# Patient Record
Sex: Female | Born: 1945
Health system: Southern US, Community
[De-identification: ages and names within clinical notes are randomized; demographics above are authoritative.]

## PROBLEM LIST (undated history)

## (undated) DIAGNOSIS — F32A Depression, unspecified: Secondary | ICD-10-CM

## (undated) DIAGNOSIS — E785 Hyperlipidemia, unspecified: Secondary | ICD-10-CM

## (undated) DIAGNOSIS — E119 Type 2 diabetes mellitus without complications: Secondary | ICD-10-CM

## (undated) DIAGNOSIS — F329 Major depressive disorder, single episode, unspecified: Secondary | ICD-10-CM

## (undated) DIAGNOSIS — I10 Essential (primary) hypertension: Secondary | ICD-10-CM

## (undated) DIAGNOSIS — F419 Anxiety disorder, unspecified: Secondary | ICD-10-CM

## (undated) DIAGNOSIS — M810 Age-related osteoporosis without current pathological fracture: Secondary | ICD-10-CM

## (undated) DIAGNOSIS — T7840XA Allergy, unspecified, initial encounter: Secondary | ICD-10-CM

## (undated) DIAGNOSIS — K219 Gastro-esophageal reflux disease without esophagitis: Secondary | ICD-10-CM

## (undated) HISTORY — DX: Hyperlipidemia, unspecified: E78.5

## (undated) HISTORY — PX: PARATHYROID EXPLORATION: SHX732

## (undated) HISTORY — DX: Major depressive disorder, single episode, unspecified: F32.9

## (undated) HISTORY — DX: Essential (primary) hypertension: I10

## (undated) HISTORY — DX: Age-related osteoporosis without current pathological fracture: M81.0

## (undated) HISTORY — PX: BIOPSY BREAST: PRO8

## (undated) HISTORY — DX: Gastro-esophageal reflux disease without esophagitis: K21.9

## (undated) HISTORY — DX: Type 2 diabetes mellitus without complications: E11.9

## (undated) HISTORY — DX: Depression, unspecified: F32.A

## (undated) HISTORY — PX: BREAST CYST ASPIRATION: SHX578

## (undated) HISTORY — DX: Anxiety disorder, unspecified: F41.9

## (undated) HISTORY — PX: INNER EAR SURGERY: SHX679

## (undated) HISTORY — DX: Allergy, unspecified, initial encounter: T78.40XA

---

## 2004-12-04 ENCOUNTER — Ambulatory Visit: Payer: Self-pay | Admitting: Family Medicine

## 2004-12-07 ENCOUNTER — Ambulatory Visit: Payer: Self-pay | Admitting: Family Medicine

## 2005-02-08 ENCOUNTER — Ambulatory Visit: Payer: Self-pay

## 2005-08-13 ENCOUNTER — Ambulatory Visit: Payer: Self-pay | Admitting: Family Medicine

## 2005-11-16 ENCOUNTER — Ambulatory Visit: Payer: Self-pay | Admitting: Gastroenterology

## 2006-08-22 ENCOUNTER — Ambulatory Visit: Payer: Self-pay | Admitting: Family Medicine

## 2006-11-12 HISTORY — PX: COLONOSCOPY: SHX5424

## 2007-08-28 ENCOUNTER — Ambulatory Visit: Payer: Self-pay | Admitting: Family Medicine

## 2008-02-13 ENCOUNTER — Ambulatory Visit: Payer: Self-pay | Admitting: Family Medicine

## 2008-03-19 ENCOUNTER — Ambulatory Visit: Payer: Self-pay | Admitting: Family Medicine

## 2008-09-07 ENCOUNTER — Ambulatory Visit: Payer: Self-pay | Admitting: Unknown Physician Specialty

## 2008-09-16 ENCOUNTER — Ambulatory Visit: Payer: Self-pay | Admitting: Unknown Physician Specialty

## 2008-12-21 ENCOUNTER — Ambulatory Visit: Payer: Self-pay | Admitting: Family Medicine

## 2010-01-02 ENCOUNTER — Ambulatory Visit: Payer: Self-pay | Admitting: Family Medicine

## 2010-01-05 ENCOUNTER — Ambulatory Visit: Payer: Self-pay | Admitting: Family Medicine

## 2010-01-23 ENCOUNTER — Ambulatory Visit: Payer: Self-pay | Admitting: Surgery

## 2010-01-30 ENCOUNTER — Ambulatory Visit: Payer: Self-pay | Admitting: Surgery

## 2010-07-11 ENCOUNTER — Ambulatory Visit: Payer: Self-pay | Admitting: Surgery

## 2010-08-21 ENCOUNTER — Ambulatory Visit: Payer: Self-pay | Admitting: Family Medicine

## 2011-08-23 ENCOUNTER — Ambulatory Visit: Payer: Self-pay | Admitting: Family Medicine

## 2012-08-26 ENCOUNTER — Ambulatory Visit: Payer: Self-pay | Admitting: Family Medicine

## 2012-09-17 ENCOUNTER — Ambulatory Visit: Payer: Self-pay | Admitting: Ophthalmology

## 2012-09-17 DIAGNOSIS — I10 Essential (primary) hypertension: Secondary | ICD-10-CM

## 2012-09-29 ENCOUNTER — Ambulatory Visit: Payer: Self-pay | Admitting: Ophthalmology

## 2012-10-21 ENCOUNTER — Ambulatory Visit: Payer: Self-pay | Admitting: Ophthalmology

## 2012-11-12 HISTORY — PX: BREAST BIOPSY: SHX20

## 2012-12-01 ENCOUNTER — Ambulatory Visit: Payer: Self-pay | Admitting: Family Medicine

## 2013-08-03 ENCOUNTER — Ambulatory Visit: Payer: Self-pay | Admitting: Family Medicine

## 2013-09-10 ENCOUNTER — Ambulatory Visit: Payer: Self-pay | Admitting: Family Medicine

## 2013-09-15 ENCOUNTER — Ambulatory Visit: Payer: Self-pay | Admitting: Family Medicine

## 2014-07-02 ENCOUNTER — Ambulatory Visit: Payer: Self-pay | Admitting: Family Medicine

## 2014-09-12 LAB — HM MAMMOGRAPHY

## 2014-09-29 ENCOUNTER — Ambulatory Visit: Payer: Self-pay | Admitting: Family Medicine

## 2015-02-24 ENCOUNTER — Ambulatory Visit
Admit: 2015-02-24 | Disposition: A | Discharge status: Home / Self Care | Payer: Self-pay | Attending: Otolaryngology | Admitting: Otolaryngology

## 2015-03-01 NOTE — Op Note (Signed)
PATIENT NAME:  Bianca Hawkins, Bianca Hawkins MR#:  [id number] DATE OF BIRTH:  10/18/1946  DATE OF PROCEDURE:  09/29/2012  PREOPERATIVE DIAGNOSIS: Visually significant cataract of the left eye.   POSTOPERATIVE DIAGNOSIS: Visually significant cataract of the left eye.   OPERATIVE PROCEDURE: Cataract extraction by phacoemulsification with implant of intraocular lens to left eye.   SURGEON: Birder Robson, MD.   ANESTHESIA:  1. Managed anesthesia care.  2. Topical tetracaine drops followed by 2% Xylocaine jelly applied in the preoperative holding area.   COMPLICATIONS: None.   TECHNIQUE:  Stop-and-chop    DESCRIPTION OF PROCEDURE: The patient was examined and consented in the preoperative holding area where the aforementioned topical anesthesia was applied to the left eye and then brought back to the Operating Room where the left eye was prepped and draped in the usual sterile ophthalmic fashion and a lid speculum was placed. A paracentesis was created with the side port blade and the anterior chamber was filled with viscoelastic. A near clear corneal incision was performed with the steel keratome. A continuous curvilinear capsulorrhexis was performed with a cystotome followed by the capsulorrhexis forceps. Hydrodissection and hydrodelineation were carried out with BSS on a blunt cannula. The lens was removed in a stop-and-chop technique and the remaining cortical material was removed with the irrigation-aspiration handpiece. The capsular bag was inflated with viscoelastic and the Tecnis ZCB00 21.0-diopter lens, serial number [id number] was placed in the capsular bag without complication. The remaining viscoelastic was removed from the eye with the irrigation-aspiration handpiece. The wounds were hydrated. The anterior chamber was flushed with Miostat and the eye was inflated to physiologic pressure. The wounds were found to be water tight. The eye was dressed with Vigamox. The patient was given protective glasses  to wear throughout the day and a shield with which to sleep tonight. The patient was also given drops with which to begin a drop regimen today and will follow-up with me in one day.   ____________________________ Livingston Diones. Anvita Hirata, MD wlp:drc D: 09/29/2012 17:35:24 ET T: 09/30/2012 08:26:34 ET JOB#: [id number]  cc: June Rode L. Verenice Westrich, MD, <Dictator> Livingston Diones Kaelynne Christley MD ELECTRONICALLY SIGNED 10/16/2012 15:43

## 2015-03-21 DIAGNOSIS — D447 Neoplasm of uncertain behavior of aortic body and other paraganglia: Secondary | ICD-10-CM | POA: Insufficient documentation

## 2015-05-03 ENCOUNTER — Encounter: Payer: Self-pay | Admitting: *Deleted

## 2015-05-24 ENCOUNTER — Other Ambulatory Visit: Payer: Self-pay

## 2015-05-25 ENCOUNTER — Other Ambulatory Visit: Payer: Self-pay | Admitting: Family Medicine

## 2015-05-25 DIAGNOSIS — E119 Type 2 diabetes mellitus without complications: Secondary | ICD-10-CM

## 2015-06-28 ENCOUNTER — Other Ambulatory Visit: Payer: Medicare Other

## 2015-06-28 DIAGNOSIS — I10 Essential (primary) hypertension: Secondary | ICD-10-CM

## 2015-06-28 DIAGNOSIS — E785 Hyperlipidemia, unspecified: Secondary | ICD-10-CM

## 2015-06-28 DIAGNOSIS — R69 Illness, unspecified: Secondary | ICD-10-CM

## 2015-06-28 DIAGNOSIS — E119 Type 2 diabetes mellitus without complications: Secondary | ICD-10-CM

## 2015-06-29 LAB — HEMOGLOBIN A1C
ESTIMATED AVERAGE GLUCOSE: 169 mg/dL
Hgb A1c MFr Bld: 7.5 % — ABNORMAL HIGH (ref 4.8–5.6)

## 2015-06-29 LAB — HEPATIC FUNCTION PANEL
ALT: 26 IU/L (ref 0–32)
AST: 25 IU/L (ref 0–40)
Alkaline Phosphatase: 65 IU/L (ref 39–117)
BILIRUBIN, DIRECT: 0.12 mg/dL (ref 0.00–0.40)
Bilirubin Total: 0.3 mg/dL (ref 0.0–1.2)
Total Protein: 6.3 g/dL (ref 6.0–8.5)

## 2015-06-29 LAB — LIPID PANEL
CHOLESTEROL TOTAL: 159 mg/dL (ref 100–199)
Chol/HDL Ratio: 2.5 ratio units (ref 0.0–4.4)
HDL: 63 mg/dL (ref >39)
LDL CALC: 70 mg/dL (ref 0–99)
TRIGLYCERIDES: 128 mg/dL (ref 0–149)
VLDL Cholesterol Cal: 26 mg/dL (ref 5–40)

## 2015-06-29 LAB — RENAL FUNCTION PANEL
ALBUMIN: 4.4 g/dL (ref 3.6–4.8)
BUN/Creatinine Ratio: 25 (ref 11–26)
BUN: 15 mg/dL (ref 8–27)
CALCIUM: 11.5 mg/dL — AB (ref 8.7–10.3)
CO2: 28 mmol/L (ref 18–29)
CREATININE: 0.61 mg/dL (ref 0.57–1.00)
Chloride: 102 mmol/L (ref 97–108)
GFR calc Af Amer: 107 mL/min/{1.73_m2} (ref >59)
GFR calc non Af Amer: 93 mL/min/{1.73_m2} (ref >59)
Glucose: 154 mg/dL — ABNORMAL HIGH (ref 65–99)
PHOSPHORUS: 3.5 mg/dL (ref 2.5–4.5)
Potassium: 5.5 mmol/L — ABNORMAL HIGH (ref 3.5–5.2)
SODIUM: 144 mmol/L (ref 134–144)

## 2015-06-30 ENCOUNTER — Other Ambulatory Visit: Payer: Self-pay | Admitting: Family Medicine

## 2015-06-30 DIAGNOSIS — E785 Hyperlipidemia, unspecified: Secondary | ICD-10-CM

## 2015-06-30 DIAGNOSIS — I10 Essential (primary) hypertension: Secondary | ICD-10-CM

## 2015-06-30 DIAGNOSIS — E119 Type 2 diabetes mellitus without complications: Secondary | ICD-10-CM

## 2015-06-30 DIAGNOSIS — E1069 Type 1 diabetes mellitus with other specified complication: Secondary | ICD-10-CM

## 2015-06-30 DIAGNOSIS — F419 Anxiety disorder, unspecified: Secondary | ICD-10-CM

## 2015-07-07 ENCOUNTER — Other Ambulatory Visit: Payer: Self-pay

## 2015-07-08 ENCOUNTER — Other Ambulatory Visit: Payer: Self-pay

## 2015-07-22 LAB — FECAL OCCULT BLOOD, IMMUNOCHEMICAL: IFOBT: NEGATIVE

## 2015-07-28 ENCOUNTER — Other Ambulatory Visit: Payer: Self-pay | Admitting: Family Medicine

## 2015-07-28 DIAGNOSIS — K219 Gastro-esophageal reflux disease without esophagitis: Secondary | ICD-10-CM

## 2015-07-28 DIAGNOSIS — E119 Type 2 diabetes mellitus without complications: Secondary | ICD-10-CM

## 2015-07-28 DIAGNOSIS — F419 Anxiety disorder, unspecified: Secondary | ICD-10-CM

## 2015-07-28 DIAGNOSIS — E785 Hyperlipidemia, unspecified: Secondary | ICD-10-CM

## 2015-08-09 ENCOUNTER — Ambulatory Visit (INDEPENDENT_AMBULATORY_CARE_PROVIDER_SITE_OTHER): Payer: Medicare Other | Admitting: Family Medicine

## 2015-08-09 ENCOUNTER — Encounter: Payer: Self-pay | Admitting: Family Medicine

## 2015-08-09 VITALS — BP 110/70 | HR 70 | Ht 69.0 in | Wt 183.0 lb

## 2015-08-09 DIAGNOSIS — E119 Type 2 diabetes mellitus without complications: Secondary | ICD-10-CM

## 2015-08-09 DIAGNOSIS — J449 Chronic obstructive pulmonary disease, unspecified: Secondary | ICD-10-CM | POA: Insufficient documentation

## 2015-08-09 DIAGNOSIS — M2550 Pain in unspecified joint: Secondary | ICD-10-CM | POA: Insufficient documentation

## 2015-08-09 DIAGNOSIS — H65499 Other chronic nonsuppurative otitis media, unspecified ear: Secondary | ICD-10-CM | POA: Insufficient documentation

## 2015-08-09 DIAGNOSIS — Z8659 Personal history of other mental and behavioral disorders: Secondary | ICD-10-CM | POA: Insufficient documentation

## 2015-08-09 DIAGNOSIS — R739 Hyperglycemia, unspecified: Secondary | ICD-10-CM | POA: Insufficient documentation

## 2015-08-09 DIAGNOSIS — E7849 Other hyperlipidemia: Secondary | ICD-10-CM | POA: Insufficient documentation

## 2015-08-09 DIAGNOSIS — K219 Gastro-esophageal reflux disease without esophagitis: Secondary | ICD-10-CM | POA: Insufficient documentation

## 2015-08-09 DIAGNOSIS — Z23 Encounter for immunization: Secondary | ICD-10-CM | POA: Insufficient documentation

## 2015-08-09 DIAGNOSIS — Z13820 Encounter for screening for osteoporosis: Secondary | ICD-10-CM | POA: Insufficient documentation

## 2015-08-09 DIAGNOSIS — Z0000 Encounter for general adult medical examination without abnormal findings: Secondary | ICD-10-CM | POA: Insufficient documentation

## 2015-08-09 MED ORDER — GLIPIZIDE ER 10 MG PO TB24: ORAL_TABLET | ORAL | Status: DC

## 2015-08-09 MED ORDER — METFORMIN HCL 1000 MG PO TABS: 1000.0000 mg | ORAL_TABLET | Freq: Two times a day (BID) | ORAL | Status: DC

## 2015-08-09 NOTE — Progress Notes (Signed)
Name: Bianca Hawkins   MRN: [id number]    DOB: 1946-07-11   Date:08/09/2015       Progress Note  Subjective  Chief Complaint  Chief Complaint  Patient presents with  . Diabetes    follow up    Diabetes She presents for her follow-up diabetic visit. She has type 2 diabetes mellitus. Pertinent negatives for hypoglycemia include no confusion, dizziness, headaches, hunger, mood changes, nervousness/anxiousness, pallor, seizures, sleepiness, speech difficulty, sweats or tremors. Pertinent negatives for diabetes include no blurred vision, no chest pain, no fatigue, no foot paresthesias, no foot ulcerations, no polydipsia, no polyphagia, no polyuria, no visual change, no weakness and no weight loss. There are no hypoglycemic complications. Symptoms are improving. Pertinent negatives for diabetic complications include no autonomic neuropathy, CVA, heart disease, impotence, nephropathy, peripheral neuropathy, PVD or retinopathy.    No problem-specific assessment & plan notes found for this encounter.   Past Medical History  Diagnosis Date  . Depression   . Diabetes mellitus without complication   . Anxiety   . Hyperlipidemia   . Hypertension   . GERD (gastroesophageal reflux disease)   . Osteoporosis     Past Surgical History  Procedure Laterality Date  . Biopsy breast      benign  . Inner ear surgery      benign tumor behind eardrum  . Colonoscopy  2008    cleared for 10 years/ occults- 07/22/2015- negative    History reviewed. No pertinent family history.  Social History   Social History  . Marital Status: Married    Spouse Name: N/A  . Number of Children: N/A  . Years of Education: N/A   Occupational History  . Not on file.   Social History Main Topics  . Smoking status: Never Smoker   . Smokeless tobacco: Not on file  . Alcohol Use: No  . Drug Use: No  . Sexual Activity: Not Currently   Other Topics Concern  . Not on file   Social History Narrative  .  No narrative on file    Allergies  Allergen Reactions  . Eggs Or Egg-Derived Products   . Other     Pepper  . Sulfa Antibiotics      Review of Systems  Constitutional: Negative for fever, chills, weight loss, malaise/fatigue and fatigue.  HENT: Negative for ear discharge, ear pain and sore throat.   Eyes: Negative for blurred vision.  Respiratory: Negative for cough, sputum production, shortness of breath and wheezing.   Cardiovascular: Negative for chest pain, palpitations and leg swelling.  Gastrointestinal: Negative for heartburn, nausea, abdominal pain, diarrhea, constipation, blood in stool and melena.  Genitourinary: Negative for dysuria, urgency, frequency, hematuria and impotence.  Musculoskeletal: Negative for myalgias, back pain, joint pain and neck pain.  Skin: Negative for pallor and rash.  Neurological: Negative for dizziness, tingling, tremors, sensory change, focal weakness, seizures, speech difficulty, weakness and headaches.  Endo/Heme/Allergies: Negative for environmental allergies, polydipsia and polyphagia. Does not bruise/bleed easily.  Psychiatric/Behavioral: Negative for depression, suicidal ideas and confusion. The patient is not nervous/anxious and does not have insomnia.      Objective  Filed Vitals:   08/09/15 0857  BP: 110/70  Pulse: 70  Height: 5\' 9"  (1.753 m)  Weight: 183 lb (83.008 kg)    Physical Exam  Constitutional: She is well-developed, well-nourished, and in no distress. No distress.  HENT:  Head: Normocephalic and atraumatic.  Right Ear: External ear normal.  Left Ear: External ear normal.  Nose: Nose normal.  Mouth/Throat: Oropharynx is clear and moist.  Eyes: Conjunctivae and EOM are normal. Pupils are equal, round, and reactive to light. Right eye exhibits no discharge. Left eye exhibits no discharge.  Neck: Normal range of motion. Neck supple. No JVD present. No thyromegaly present.  Cardiovascular: Normal rate, regular rhythm,  normal heart sounds and intact distal pulses.  Exam reveals no gallop and no friction rub.   No murmur heard. Pulmonary/Chest: Effort normal and breath sounds normal.  Abdominal: Soft. Bowel sounds are normal. She exhibits no mass. There is no tenderness. There is no guarding.  Musculoskeletal: Normal range of motion. She exhibits no edema.  Lymphadenopathy:    She has no cervical adenopathy.  Neurological: She is alert. She has normal reflexes.  Skin: Skin is warm and dry. She is not diaphoretic.  Psychiatric: Mood and affect normal.  Nursing note and vitals reviewed.     Assessment & Plan  Problem List Items Addressed This Visit      Endocrine   Diabetes mellitus with no complication - Primary   Relevant Medications   aspirin 81 MG tablet   glipiZIDE (GLUCOTROL XL) 10 MG 24 hr tablet   metFORMIN (GLUCOPHAGE) 1000 MG tablet   Other Relevant Orders   Ambulatory referral to diabetic education    Other Visit Diagnoses    Type 2 diabetes mellitus without complication        Relevant Medications    aspirin 81 MG tablet    glipiZIDE (GLUCOTROL XL) 10 MG 24 hr tablet    metFORMIN (GLUCOPHAGE) 1000 MG tablet    Other Relevant Orders    Ambulatory referral to diabetic education         Dr. [facility]  08/09/2015

## 2015-08-09 NOTE — Patient Instructions (Signed)

## 2015-08-29 ENCOUNTER — Other Ambulatory Visit: Payer: Self-pay

## 2015-08-29 ENCOUNTER — Other Ambulatory Visit: Payer: Self-pay | Admitting: Family Medicine

## 2015-08-29 DIAGNOSIS — I10 Essential (primary) hypertension: Secondary | ICD-10-CM

## 2015-08-29 DIAGNOSIS — E785 Hyperlipidemia, unspecified: Secondary | ICD-10-CM

## 2015-08-29 DIAGNOSIS — K219 Gastro-esophageal reflux disease without esophagitis: Secondary | ICD-10-CM

## 2015-08-29 DIAGNOSIS — F419 Anxiety disorder, unspecified: Secondary | ICD-10-CM

## 2015-08-29 MED ORDER — SIMVASTATIN 20 MG PO TABS: 20.0000 mg | ORAL_TABLET | Freq: Every day | ORAL | Status: DC

## 2015-08-29 MED ORDER — RAMIPRIL 5 MG PO CAPS: 5.0000 mg | ORAL_CAPSULE | Freq: Every day | ORAL | Status: DC

## 2015-08-29 MED ORDER — OMEPRAZOLE 20 MG PO CPDR: 20.0000 mg | DELAYED_RELEASE_CAPSULE | Freq: Every day | ORAL | Status: DC

## 2015-08-29 MED ORDER — PAROXETINE HCL 10 MG PO TABS: 10.0000 mg | ORAL_TABLET | Freq: Every day | ORAL | Status: DC

## 2015-08-30 ENCOUNTER — Encounter: Payer: Self-pay | Admitting: Family Medicine

## 2015-09-05 ENCOUNTER — Encounter: Payer: Medicare Other | Attending: Family Medicine | Admitting: Dietician

## 2015-09-05 VITALS — BP 120/80 | Ht 69.0 in | Wt 187.1 lb

## 2015-09-05 DIAGNOSIS — E119 Type 2 diabetes mellitus without complications: Secondary | ICD-10-CM

## 2015-09-05 NOTE — Progress Notes (Signed)
Patient reports less cooking since husband's death almost 1 year ago; she is having difficulty preparing meals just for herself.    She states her HbA1C has also increased since that time.   She tests FBGs daily, with results ranging from 80s-160s.   Diabetes Self-Management Education  Visit Type: Diabetes refresher program  Appt. Start Time: 1330 Appt. End Time: 1430  09/05/2015  Bianca Hawkins, identified by name and date of birth, is a 69 y.o. female with a diagnosis of Diabetes:  .   ASSESSMENT  Blood pressure 120/80, height 5\' 9"  (1.753 m), weight 187 lb 1.6 oz (84.868 kg). Body mass index is 27.62 kg/(m^2).      Diabetes Self-Management Education - 09/05/15 1828    Patient Education   Psychosocial adjustment Role of stress on diabetes         Nutrition       Updated 1400kcal meal plan             Provided menus for quick and balanced meals, and discussed options for meals for one         Advised protein source with each meal.          Advised patient to eat a meal or snack every 4-5 hours while awake.   Individualized Plan for Diabetes Self-Management Training:   Learning Objective:  Patient will have a greater understanding of diabetes self-management. Patient education plan is to attend individual and/or group sessions per assessed needs and concerns.   Plan:   Patient Instructions  Include protein source with all meals.  Use menus provided for ideas for easy balanced meals.     Expected Outcomes:     Education material provided: Planning A Balanced Meal with 1400kcal meal plan         Quick and Healthy Meal Ideas         Smart Snacking  If problems or questions, patient to contact team via:  Phone  Future DSME appointment:  Patient declined scheduling a second visit at this time.        She will call later and schedule if needed.

## 2015-09-05 NOTE — Patient Instructions (Addendum)
   Include protein source with all meals.   Use menus provided for ideas for easy balanced meals.

## 2015-09-13 ENCOUNTER — Encounter: Payer: Self-pay | Admitting: Family Medicine

## 2015-09-13 ENCOUNTER — Ambulatory Visit (INDEPENDENT_AMBULATORY_CARE_PROVIDER_SITE_OTHER): Payer: Medicare Other | Admitting: Family Medicine

## 2015-09-13 VITALS — BP 120/88 | HR 78 | Ht 69.0 in | Wt 186.0 lb

## 2015-09-13 DIAGNOSIS — I739 Peripheral vascular disease, unspecified: Secondary | ICD-10-CM

## 2015-09-13 DIAGNOSIS — Z1239 Encounter for other screening for malignant neoplasm of breast: Secondary | ICD-10-CM | POA: Diagnosis not present

## 2015-09-13 DIAGNOSIS — Z0000 Encounter for general adult medical examination without abnormal findings: Secondary | ICD-10-CM

## 2015-09-13 DIAGNOSIS — I872 Venous insufficiency (chronic) (peripheral): Secondary | ICD-10-CM | POA: Diagnosis not present

## 2015-09-13 NOTE — Progress Notes (Signed)
Patient: Bianca Hawkins, Female    DOB: 08-Feb-1946, 69 y.o.   MRN: [id number] Visit Date: 09/13/2015  Today's Provider: Otilio Miu, MD   Chief Complaint  Patient presents with  . Annual Exam    MAW   Subjective:   Initial preventative physical exam Bianca Hawkins is a 69 y.o. female who presents today for her Initial Preventative Physical Exam. She feels well. She reports exercising not much. She reports she is sleeping well.  HPI Comments: Medicare annual wllness with no subjective/objective concerns.  Leg Pain  The incident occurred more than 1 week ago. The pain is mild. The pain has been constant since onset. Pertinent negatives include no inability to bear weight, loss of motion, loss of sensation, muscle weakness, numbness or tingling. She has tried acetaminophen for the symptoms. The treatment provided no relief.    Review of Systems  Constitutional: Negative for fever, chills, fatigue and unexpected weight change.  HENT: Negative for ear pain, hearing loss, nosebleeds, sneezing, sore throat and trouble swallowing.   Eyes: Negative for photophobia, pain, redness, itching and visual disturbance.  Respiratory: Negative for cough, chest tightness, shortness of breath and wheezing.   Cardiovascular: Negative for chest pain, palpitations and leg swelling.       Claudication  Gastrointestinal: Negative for nausea, vomiting, abdominal pain, diarrhea, constipation, blood in stool and rectal pain.  Endocrine: Negative for cold intolerance, heat intolerance, polydipsia, polyphagia and polyuria.  Genitourinary: Negative for dysuria, hematuria, flank pain, vaginal bleeding, vaginal discharge, difficulty urinating, menstrual problem and pelvic pain.  Musculoskeletal: Negative for back pain, joint swelling, neck pain and neck stiffness.  Skin: Negative for color change and rash.  Allergic/Immunologic: Negative for immunocompromised state.  Neurological: Negative for dizziness,  tingling, tremors, seizures, syncope, facial asymmetry, speech difficulty, weakness, light-headedness, numbness and headaches.  Hematological: Does not bruise/bleed easily.  Psychiatric/Behavioral: Negative for suicidal ideas, hallucinations, behavioral problems, confusion, sleep disturbance, self-injury and agitation. The patient is not nervous/anxious.     Social History   Social History  . Marital Status: Married    Spouse Name: N/A  . Number of Children: N/A  . Years of Education: N/A   Occupational History  . Not on file.   Social History Main Topics  . Smoking status: Never Smoker   . Smokeless tobacco: Not on file  . Alcohol Use: No  . Drug Use: No  . Sexual Activity: Not Currently   Other Topics Concern  . Not on file   Social History Narrative    Patient Active Problem List   Diagnosis Date Noted  . Diabetes mellitus with no complication ([place]) 84/16/6063  . Familial multiple lipoprotein-type hyperlipidemia 08/09/2015  . Pain in joint 08/09/2015  . Encounter for general adult medical examination without abnormal findings 08/09/2015  . Chronic obstructive pulmonary disease (COPD) ([place]) 08/09/2015  . H/O: depression 08/09/2015  . Gastroesophageal reflux disease without esophagitis 08/09/2015  . Blood glucose elevated 08/09/2015  . Encounter for screening for osteoporosis 08/09/2015  . Need for vaccination 08/09/2015  . Chronic MEE (middle ear effusion) 08/09/2015  . 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 Biopsy breast      benign  . Inner ear surgery      benign tumor behind eardrum  . Colonoscopy  2008    cleared for 10 years/ occults- 07/22/2015- negative    Her family history is not on file.    Previous Medications   ACCU-CHEK  AVIVA PLUS TEST STRIP    USE TO CHECK BLOOD SUGAR DAILY   ASPIRIN 81 MG TABLET    Take 1 tablet by mouth daily.   CALCIUM CARB-CHOLECALCIFEROL (CALCIUM 600 + D PO)     Take 1 tablet by mouth 2 (two) times daily.   FLUTICASONE (FLONASE) 50 MCG/ACT NASAL SPRAY    Place 1 spray into both nostrils as needed.   GLIPIZIDE (GLUCOTROL XL) 10 MG 24 HR TABLET    TAKE 1 TABLET BY MOUTH EACH DAY DO NOT SKIP MEALS   METFORMIN (GLUCOPHAGE) 1000 MG TABLET    Take 1 tablet (1,000 mg total) by mouth 2 (two) times daily.   METOPROLOL (LOPRESSOR) 50 MG TABLET    Take 1 tablet by mouth 2 (two) times daily.   MULTIPLE VITAMINS-MINERALS (CENTRUM SILVER ADULT 50+) TABS    Take 1 tablet by mouth daily.   OMEPRAZOLE (PRILOSEC) 20 MG CAPSULE    Take 1 capsule (20 mg total) by mouth daily.   PAROXETINE (PAXIL) 10 MG TABLET    Take 1 tablet (10 mg total) by mouth daily.   RAMIPRIL (ALTACE) 5 MG CAPSULE    Take 1 capsule (5 mg total) by mouth daily.   SIMVASTATIN (ZOCOR) 20 MG TABLET    Take 1 tablet (20 mg total) by mouth daily.    Patient Care Team: Juline Patch, MD as PCP - General (Family Medicine)     Objective:   Vitals: BP 120/88 mmHg  Pulse 78  Ht 5\' 9"  (1.753 m)  Wt 186 lb (84.369 kg)  BMI 27.45 kg/m2  Physical Exam  Constitutional: She is oriented to person, place, and time. She appears well-developed and well-nourished.  HENT:  Head: Normocephalic.  Right Ear: External ear normal.  Left Ear: External ear normal.  Nose: Nose normal.  Eyes: Conjunctivae and EOM are normal. Pupils are equal, round, and reactive to light.  Neck: Normal range of motion. Neck supple.  Cardiovascular: Normal rate, regular rhythm, normal heart sounds and intact distal pulses.   Pulmonary/Chest: Effort normal and breath sounds normal.  Abdominal: Soft. Bowel sounds are normal.  Genitourinary: Vagina normal and uterus normal.  Musculoskeletal: Normal range of motion.  Neurological: She is alert and oriented to person, place, and time. She has normal reflexes.  Skin: Skin is warm and dry.  Psychiatric: She has a normal mood and affect. Her behavior is normal. Judgment and thought  content normal.     No exam data present  Activities of Daily Living In your present state of health, do you have any difficulty performing the following activities: 08/09/2015  Hearing? N  Vision? N  Difficulty concentrating or making decisions? N  Walking or climbing stairs? N  Dressing or bathing? N  Doing errands, shopping? N    Fall Risk Assessment Fall Risk  09/05/2015 08/09/2015  Falls in the past year? No No     Patient reports there are safety devices in place in shower at home.   Depression Screen PHQ 2/9 Scores 09/05/2015 08/09/2015  PHQ - 2 Score 0 0    Cognitive Testing - 6-CIT   Correct? Score   What year is it? yes 0 Yes = 0    No = 4  What month is it? yes 0 Yes = 0    No = 3  Remember:     Pia [address]au, [place], [place]     What time is it? yes 0 Yes = 0  No = 3  Count backwards from 20 to 1 yes 0 Correct = 0    1 error = 2   More than 1 error = 4  Say the months of the year in reverse. yes 0 Correct = 0    1 error = 2   More than 1 error = 4  What address did I ask you to remember? yes 0 Correct = 0  1 error = 2    2 error = 4    3 error = 6    4 error = 8    All wrong = 10       TOTAL SCORE  0/28   Interpretation:  Normal  Normal (0-7) Abnormal (8-28)     Assessment & Plan:     Initial Preventative Physical Exam  Reviewed patient's Family Medical History Reviewed and updated list of patient's medical providers Assessment of cognitive impairment was done Assessed patient's functional ability Established a written schedule for health screening [place] Completed and Reviewed  Exercise Activities and Dietary recommendations Goals    None      Immunization History  Administered Date(s) Administered  . Pneumococcal Polysaccharide-23 09/03/2013  . Tdap 09/03/2013    Health Maintenance  Topic Date Due  . Hepatitis C Screening  10-Aug-1946  . FOOT EXAM  10/03/1955  . OPHTHALMOLOGY EXAM  10/03/1955  . URINE  MICROALBUMIN  10/03/1955  . COLONOSCOPY  10/03/1995  . ZOSTAVAX  10/02/2005  . DEXA SCAN  10/02/2010  . PNA vac Low Risk Adult (2 of 2 - PCV13) 09/03/2014  . INFLUENZA VACCINE  06/13/2015  . HEMOGLOBIN A1C  12/29/2015  . MAMMOGRAM  09/12/2016  . TETANUS/TDAP  09/04/2023      Discussed health benefits of physical activity, and encouraged her to engage in regular exercise appropriate for her age and condition.    ------------------------------------------------------------------------------------------------------------   Problem List Items Addressed This Visit    None    Visit Diagnoses    Medicare annual wellness visit, subsequent    -  Primary    Claudication of both lower extremities [facility])        Relevant Orders    Ambulatory referral to Vascular Surgery    Venous insufficiency of both lower extremities        Relevant Orders    Ambulatory referral to Vascular Surgery    Breast cancer screening        Relevant Orders    MM Digital Screening        Otilio Miu, MD [facility]  09/13/2015

## 2015-09-26 ENCOUNTER — Ambulatory Visit (INDEPENDENT_AMBULATORY_CARE_PROVIDER_SITE_OTHER): Payer: Medicare Other | Admitting: Family Medicine

## 2015-09-26 ENCOUNTER — Encounter: Payer: Self-pay | Admitting: Family Medicine

## 2015-09-26 VITALS — BP 120/78 | HR 64 | Ht 69.0 in | Wt 185.0 lb

## 2015-09-26 DIAGNOSIS — J40 Bronchitis, not specified as acute or chronic: Secondary | ICD-10-CM | POA: Diagnosis not present

## 2015-09-26 DIAGNOSIS — J0100 Acute maxillary sinusitis, unspecified: Secondary | ICD-10-CM | POA: Diagnosis not present

## 2015-09-26 DIAGNOSIS — B379 Candidiasis, unspecified: Secondary | ICD-10-CM

## 2015-09-26 MED ORDER — BENZONATATE 100 MG PO CAPS: 100.0000 mg | ORAL_CAPSULE | Freq: Two times a day (BID) | ORAL | Status: DC | PRN

## 2015-09-26 MED ORDER — AZITHROMYCIN 250 MG PO TABS: ORAL_TABLET | ORAL | Status: DC

## 2015-09-26 MED ORDER — FLUCONAZOLE 150 MG PO TABS: 150.0000 mg | ORAL_TABLET | Freq: Once | ORAL | Status: DC

## 2015-09-26 NOTE — Progress Notes (Signed)
Name: Bianca Hawkins   MRN: [id number]    DOB: 04-Jun-1946   Date:09/26/2015       Progress Note  Subjective  Chief Complaint  Chief Complaint  Patient presents with  . Sinusitis    congestion    Sinusitis This is a new problem. The current episode started 1 to 4 weeks ago. The problem has been gradually worsening since onset. There has been no fever. The pain is mild. Associated symptoms include chills, headaches, sinus pressure and a sore throat. Pertinent negatives include no coughing, diaphoresis, ear pain, neck pain, shortness of breath, sneezing or swollen glands. Past treatments include acetaminophen and oral decongestants. The treatment provided mild relief.    No problem-specific assessment & plan notes found for this encounter.   Past Medical History  Diagnosis Date  . Depression   . Diabetes mellitus without complication ([place])   . Anxiety   . Hyperlipidemia   . Hypertension   . GERD (gastroesophageal reflux disease)   . Osteoporosis     Past Surgical History  Procedure Laterality Date  . Biopsy breast      benign  . Inner ear surgery      benign tumor behind eardrum  . Colonoscopy  2008    cleared for 10 years/ occults- 07/22/2015- negative    History reviewed. No pertinent family history.  Social History   Social History  . Marital Status: Married    Spouse Name: N/A  . Number of Children: N/A  . Years of Education: N/A   Occupational History  . Not on file.   Social History Main Topics  . Smoking status: Never Smoker   . Smokeless tobacco: Not on file  . Alcohol Use: No  . Drug Use: No  . Sexual Activity: Not Currently   Other Topics Concern  . Not on file   Social History Narrative    Allergies  Allergen Reactions  . Eggs Or Egg-Derived Products   . Other     Pepper  . Sulfa Antibiotics      Review of Systems  Constitutional: Positive for chills. Negative for fever, weight loss, malaise/fatigue and diaphoresis.  HENT:  Positive for sinus pressure and sore throat. Negative for ear discharge, ear pain and sneezing.   Eyes: Negative for blurred vision.  Respiratory: Negative for cough, sputum production, shortness of breath and wheezing.   Cardiovascular: Negative for chest pain, palpitations and leg swelling.  Gastrointestinal: Negative for heartburn, nausea, abdominal pain, diarrhea, constipation, blood in stool and melena.  Genitourinary: Negative for dysuria, urgency, frequency and hematuria.  Musculoskeletal: Negative for myalgias, back pain, joint pain and neck pain.  Skin: Negative for rash.  Neurological: Positive for headaches. Negative for dizziness, tingling, sensory change and focal weakness.  Endo/Heme/Allergies: Negative for environmental allergies and polydipsia. Does not bruise/bleed easily.  Psychiatric/Behavioral: Negative for depression and suicidal ideas. The patient is not nervous/anxious and does not have insomnia.      Objective  Filed Vitals:   09/26/15 1549  BP: 120/78  Pulse: 64  Height: 5\' 9"  (1.753 m)  Weight: 185 lb (83.915 kg)    Physical Exam  Constitutional: She is well-developed, well-nourished, and in no distress. No distress.  HENT:  Head: Normocephalic and atraumatic.  Right Ear: External ear normal.  Left Ear: External ear normal.  Nose: Nose normal.  Mouth/Throat: Oropharynx is clear and moist.  Eyes: Conjunctivae and EOM are normal. Pupils are equal, round, and reactive to light. Right eye exhibits no discharge.  Left eye exhibits no discharge.  Neck: Normal range of motion. Neck supple. No JVD present. No thyromegaly present.  Cardiovascular: Normal rate, regular rhythm, normal heart sounds and intact distal pulses.  Exam reveals no gallop and no friction rub.   No murmur heard. Pulmonary/Chest: Effort normal and breath sounds normal.  Abdominal: Soft. Bowel sounds are normal. She exhibits no mass. There is no tenderness. There is no guarding.   Musculoskeletal: Normal range of motion. She exhibits no edema.  Lymphadenopathy:    She has no cervical adenopathy.  Neurological: She is alert. She has normal reflexes.  Skin: Skin is warm and dry. She is not diaphoretic.  Psychiatric: Mood and affect normal.  Nursing note and vitals reviewed.     Assessment & Plan  Problem List Items Addressed This Visit    None    Visit Diagnoses    Acute maxillary sinusitis, recurrence not specified    -  Primary    Relevant Medications    azithromycin (ZITHROMAX) 250 MG tablet    benzonatate (TESSALON) 100 MG capsule    fluconazole (DIFLUCAN) 150 MG tablet    Bronchitis        Relevant Medications    benzonatate (TESSALON) 100 MG capsule    Candidiasis        Relevant Medications    azithromycin (ZITHROMAX) 250 MG tablet    fluconazole (DIFLUCAN) 150 MG tablet         Dr. Deanna Jones [facility]  09/26/2015

## 2015-10-19 ENCOUNTER — Other Ambulatory Visit: Payer: Self-pay

## 2015-10-19 DIAGNOSIS — E119 Type 2 diabetes mellitus without complications: Secondary | ICD-10-CM

## 2015-10-19 DIAGNOSIS — I10 Essential (primary) hypertension: Secondary | ICD-10-CM

## 2015-10-19 MED ORDER — METOPROLOL TARTRATE 50 MG PO TABS: 50.0000 mg | ORAL_TABLET | Freq: Two times a day (BID) | ORAL | Status: DC

## 2015-10-19 MED ORDER — METFORMIN HCL 1000 MG PO TABS: 1000.0000 mg | ORAL_TABLET | Freq: Two times a day (BID) | ORAL | Status: DC

## 2015-10-20 ENCOUNTER — Ambulatory Visit
Admission: RE | Admit: 2015-10-20 | Discharge: 2015-10-20 | Disposition: A | Discharge status: Home / Self Care | Payer: Medicare Other | Source: Ambulatory Visit | Attending: Family Medicine | Admitting: Family Medicine

## 2015-10-20 ENCOUNTER — Other Ambulatory Visit: Payer: Self-pay | Admitting: Family Medicine

## 2015-10-20 DIAGNOSIS — Z1231 Encounter for screening mammogram for malignant neoplasm of breast: Secondary | ICD-10-CM | POA: Diagnosis present

## 2015-10-20 DIAGNOSIS — Z1239 Encounter for other screening for malignant neoplasm of breast: Secondary | ICD-10-CM

## 2015-11-04 ENCOUNTER — Other Ambulatory Visit: Payer: Self-pay

## 2015-11-23 ENCOUNTER — Other Ambulatory Visit: Payer: Self-pay

## 2015-12-01 ENCOUNTER — Other Ambulatory Visit: Payer: Self-pay

## 2015-12-05 ENCOUNTER — Other Ambulatory Visit: Payer: Self-pay | Admitting: Family Medicine

## 2016-01-23 ENCOUNTER — Other Ambulatory Visit: Payer: Self-pay | Admitting: Family Medicine

## 2016-01-24 ENCOUNTER — Other Ambulatory Visit: Payer: Self-pay | Admitting: Family Medicine

## 2016-03-08 ENCOUNTER — Other Ambulatory Visit: Payer: Medicare Other

## 2016-03-08 ENCOUNTER — Other Ambulatory Visit: Payer: Self-pay

## 2016-03-08 DIAGNOSIS — E119 Type 2 diabetes mellitus without complications: Secondary | ICD-10-CM

## 2016-03-08 DIAGNOSIS — I10 Essential (primary) hypertension: Secondary | ICD-10-CM

## 2016-03-08 DIAGNOSIS — R69 Illness, unspecified: Secondary | ICD-10-CM

## 2016-03-08 DIAGNOSIS — E785 Hyperlipidemia, unspecified: Secondary | ICD-10-CM

## 2016-03-08 DIAGNOSIS — S8264XA Nondisplaced fracture of lateral malleolus of right fibula, initial encounter for closed fracture: Secondary | ICD-10-CM

## 2016-03-09 ENCOUNTER — Other Ambulatory Visit: Payer: Self-pay | Admitting: Family Medicine

## 2016-03-09 LAB — RENAL FUNCTION PANEL
ALBUMIN: 4.6 g/dL (ref 3.5–4.8)
BUN/Creatinine Ratio: 21 (ref 12–28)
BUN: 16 mg/dL (ref 8–27)
CO2: 28 mmol/L (ref 18–29)
Calcium: 11 mg/dL — ABNORMAL HIGH (ref 8.7–10.3)
Chloride: 100 mmol/L (ref 96–106)
Creatinine, Ser: 0.77 mg/dL (ref 0.57–1.00)
GFR, EST AFRICAN AMERICAN: 90 mL/min/{1.73_m2} (ref >59)
GFR, EST NON AFRICAN AMERICAN: 78 mL/min/{1.73_m2} (ref >59)
GLUCOSE: 154 mg/dL — AB (ref 65–99)
PHOSPHORUS: 3.6 mg/dL (ref 2.5–4.5)
POTASSIUM: 5.3 mmol/L — AB (ref 3.5–5.2)
Sodium: 144 mmol/L (ref 134–144)

## 2016-03-09 LAB — LIPID PANEL
CHOLESTEROL TOTAL: 157 mg/dL (ref 100–199)
Chol/HDL Ratio: 3 ratio units (ref 0.0–4.4)
HDL: 53 mg/dL (ref >39)
LDL Calculated: 67 mg/dL (ref 0–99)
TRIGLYCERIDES: 185 mg/dL — AB (ref 0–149)
VLDL Cholesterol Cal: 37 mg/dL (ref 5–40)

## 2016-03-09 LAB — HEMOGLOBIN A1C
ESTIMATED AVERAGE GLUCOSE: 166 mg/dL
Hgb A1c MFr Bld: 7.4 % — ABNORMAL HIGH (ref 4.8–5.6)

## 2016-03-09 LAB — HEPATIC FUNCTION PANEL
ALK PHOS: 65 IU/L (ref 39–117)
ALT: 23 IU/L (ref 0–32)
AST: 23 IU/L (ref 0–40)
BILIRUBIN, DIRECT: 0.09 mg/dL (ref 0.00–0.40)
Total Protein: 6.6 g/dL (ref 6.0–8.5)

## 2016-03-09 LAB — MICROALBUMIN / CREATININE URINE RATIO
CREATININE, UR: 170.9 mg/dL
MICROALB/CREAT RATIO: 19.4 mg/g creat (ref 0.0–30.0)
Microalbumin, Urine: 33.1 ug/mL

## 2016-03-10 ENCOUNTER — Other Ambulatory Visit: Payer: Self-pay | Admitting: Family Medicine

## 2016-03-12 ENCOUNTER — Other Ambulatory Visit: Payer: Self-pay | Admitting: Family Medicine

## 2016-03-22 ENCOUNTER — Encounter: Payer: Self-pay | Admitting: Family Medicine

## 2016-03-22 ENCOUNTER — Ambulatory Visit (INDEPENDENT_AMBULATORY_CARE_PROVIDER_SITE_OTHER): Payer: Medicare Other | Admitting: Family Medicine

## 2016-03-22 VITALS — BP 130/80 | HR 64 | Ht 69.0 in | Wt 186.0 lb

## 2016-03-22 DIAGNOSIS — I10 Essential (primary) hypertension: Secondary | ICD-10-CM | POA: Diagnosis not present

## 2016-03-22 DIAGNOSIS — E785 Hyperlipidemia, unspecified: Secondary | ICD-10-CM

## 2016-03-22 DIAGNOSIS — Z8659 Personal history of other mental and behavioral disorders: Secondary | ICD-10-CM | POA: Diagnosis not present

## 2016-03-22 DIAGNOSIS — E119 Type 2 diabetes mellitus without complications: Secondary | ICD-10-CM

## 2016-03-22 DIAGNOSIS — E7849 Other hyperlipidemia: Secondary | ICD-10-CM

## 2016-03-22 DIAGNOSIS — E784 Other hyperlipidemia: Secondary | ICD-10-CM | POA: Diagnosis not present

## 2016-03-22 DIAGNOSIS — K219 Gastro-esophageal reflux disease without esophagitis: Secondary | ICD-10-CM

## 2016-03-22 DIAGNOSIS — F419 Anxiety disorder, unspecified: Secondary | ICD-10-CM | POA: Diagnosis not present

## 2016-03-22 MED ORDER — METOPROLOL TARTRATE 50 MG PO TABS: 50.0000 mg | ORAL_TABLET | Freq: Two times a day (BID) | ORAL | Status: DC

## 2016-03-22 MED ORDER — PAROXETINE HCL 10 MG PO TABS: 10.0000 mg | ORAL_TABLET | Freq: Every day | ORAL | Status: DC

## 2016-03-22 MED ORDER — GLIPIZIDE ER 10 MG PO TB24: 10.0000 mg | ORAL_TABLET | Freq: Every day | ORAL | Status: DC

## 2016-03-22 MED ORDER — OMEPRAZOLE 20 MG PO CPDR: DELAYED_RELEASE_CAPSULE | ORAL | Status: DC

## 2016-03-22 MED ORDER — SIMVASTATIN 20 MG PO TABS: 20.0000 mg | ORAL_TABLET | Freq: Every day | ORAL | Status: DC

## 2016-03-22 MED ORDER — RAMIPRIL 5 MG PO CAPS: ORAL_CAPSULE | ORAL | Status: DC

## 2016-03-22 MED ORDER — METFORMIN HCL 1000 MG PO TABS: 1000.0000 mg | ORAL_TABLET | Freq: Two times a day (BID) | ORAL | Status: DC

## 2016-03-22 NOTE — Progress Notes (Signed)
Name: Bianca Hawkins   MRN: [id number]    DOB: 11-21-1945   Date:03/22/2016       Progress Note  Subjective  Chief Complaint  Chief Complaint  Patient presents with  . Hyperlipidemia  . Hypertension  . Diabetes  . Anxiety  . Gastroesophageal Reflux    Hyperlipidemia This is a chronic problem. The current episode started more than 1 year ago. The problem is controlled. Recent lipid tests were reviewed and are normal. She has no history of chronic renal disease, diabetes, hypothyroidism, liver disease, obesity or nephrotic syndrome. There are no known factors aggravating her hyperlipidemia. Pertinent negatives include no chest pain, focal sensory loss, focal weakness, leg pain, myalgias or shortness of breath. She is currently on no antihyperlipidemic treatment. The current treatment provides moderate improvement of lipids. There are no compliance problems.  Risk factors for coronary artery disease include dyslipidemia, hypertension and post-menopausal.  Hypertension This is a chronic problem. The current episode started more than 1 year ago. The problem has been gradually improving since onset. Associated symptoms include anxiety. Pertinent negatives include no blurred vision, chest pain, headaches, malaise/fatigue, neck pain, orthopnea, palpitations, peripheral edema, PND, shortness of breath or sweats. There are no associated agents to hypertension. There are no known risk factors for coronary artery disease. Past treatments include ACE inhibitors, beta blockers and diuretics. The current treatment provides moderate improvement. There is no history of angina, kidney disease, CAD/MI, CVA, heart failure, left ventricular hypertrophy, PVD, renovascular disease or retinopathy. There is no history of chronic renal disease or a hypertension causing med.  Diabetes She presents for her follow-up diabetic visit. She has type 2 diabetes mellitus. Hypoglycemia symptoms include  nervousness/anxiousness. Pertinent negatives for hypoglycemia include no confusion, dizziness, headaches, mood changes or sweats. Pertinent negatives for diabetes include no blurred vision, no chest pain, no fatigue, no foot paresthesias, no foot ulcerations, no polydipsia, no polyphagia, no polyuria, no visual change, no weakness and no weight loss. There are no hypoglycemic complications. Symptoms are stable. There are no diabetic complications. Pertinent negatives for diabetic complications include no CVA, PVD or retinopathy. Risk factors for coronary artery disease include diabetes mellitus and dyslipidemia. Current diabetic treatment includes diet and oral agent (dual therapy). She is compliant with treatment all of the time. Her weight is stable. She is following a generally healthy diet. Her breakfast blood glucose is taken between 8-9 am. Her breakfast blood glucose range is generally 110-130 mg/dl. An ACE inhibitor/angiotensin II receptor blocker is being taken. She does not see a podiatrist.Eye exam is current.  Anxiety Presents for follow-up visit. The problem has been gradually improving. Symptoms include excessive worry and nervous/anxious behavior. Patient reports no chest pain, confusion, dizziness, insomnia, nausea, palpitations, shortness of breath or suicidal ideas. Symptoms occur occasionally. Nothing (TRUMP) aggravates the symptoms.   Her past medical history is significant for depression. There is no history of anemia, CAD, CHF or hyperthyroidism.  Gastroesophageal Reflux She reports no abdominal pain, no belching, no chest pain, no coughing, no early satiety, no globus sensation, no heartburn, no nausea, no sore throat, no stridor, no tooth decay or no wheezing. This is a new problem. The problem occurs occasionally. Pertinent negatives include no fatigue, melena or weight loss.    No problem-specific assessment & plan notes found for this encounter.   Past Medical History   Diagnosis Date  . Depression   . Diabetes mellitus without complication ([place])   . Anxiety   . Hyperlipidemia   .  Hypertension   . GERD (gastroesophageal reflux disease)   . Osteoporosis     Past Surgical History  Procedure Laterality Date  . Biopsy breast      benign  . Inner ear surgery      benign tumor behind eardrum  . Colonoscopy  2008    cleared for 10 years/ occults- 07/22/2015- negative  . Breast biopsy Right 2014    core with clips    Family History  Problem Relation Age of Onset  . Breast cancer Neg Hx     Social History   Social History  . Marital Status: Married    Spouse Name: N/A  . Number of Children: N/A  . Years of Education: N/A   Occupational History  . Not on file.   Social History Main Topics  . Smoking status: Never Smoker   . Smokeless tobacco: Not on file  . Alcohol Use: No  . Drug Use: No  . Sexual Activity: Not Currently   Other Topics Concern  . Not on file   Social History Narrative    Allergies  Allergen Reactions  . Eggs Or Egg-Derived Products   . Other     Pepper  . Sulfa Antibiotics      Review of Systems  Constitutional: Negative for fever, chills, weight loss, malaise/fatigue and fatigue.  HENT: Negative for ear discharge, ear pain and sore throat.   Eyes: Negative for blurred vision.  Respiratory: Negative for cough, sputum production, shortness of breath and wheezing.   Cardiovascular: Negative for chest pain, palpitations, orthopnea, leg swelling and PND.  Gastrointestinal: Negative for heartburn, nausea, abdominal pain, diarrhea, constipation, blood in stool and melena.  Genitourinary: Negative for dysuria, urgency, frequency and hematuria.  Musculoskeletal: Negative for myalgias, back pain, joint pain and neck pain.  Skin: Negative for rash.  Neurological: Negative for dizziness, tingling, sensory change, focal weakness, weakness and headaches.  Endo/Heme/Allergies: Negative for environmental allergies,  polydipsia and polyphagia. Does not bruise/bleed easily.  Psychiatric/Behavioral: Negative for depression, suicidal ideas and confusion. The patient is nervous/anxious. The patient does not have insomnia.      Objective  Filed Vitals:   03/22/16 0917  BP: 130/80  Pulse: 64  Height: 5\' 9"  (1.753 m)  Weight: 186 lb (84.369 kg)    Physical Exam  Constitutional: She is well-developed, well-nourished, and in no distress. No distress.  HENT:  Head: Normocephalic and atraumatic.  Right Ear: Tympanic membrane, external ear and ear canal normal.  Left Ear: Tympanic membrane, external ear and ear canal normal.  Nose: Nose normal.  Mouth/Throat: Oropharynx is clear and moist.  Eyes: Conjunctivae and EOM are normal. Pupils are equal, round, and reactive to light. Right eye exhibits no discharge. Left eye exhibits no discharge.  Neck: Normal range of motion. Neck supple. No JVD present. No thyromegaly present.  Cardiovascular: Normal rate, regular rhythm, normal heart sounds and intact distal pulses.  Exam reveals no gallop and no friction rub.   No murmur heard. Pulmonary/Chest: Effort normal and breath sounds normal.  Abdominal: Soft. Bowel sounds are normal. She exhibits no mass. There is no tenderness. There is no guarding.  Musculoskeletal: Normal range of motion. She exhibits no edema.  Lymphadenopathy:    She has no cervical adenopathy.  Neurological: She is alert. She has normal sensation, normal strength, normal reflexes and intact cranial nerves.  Foot exam normal  Skin: Skin is warm and dry. She is not diaphoretic.  Psychiatric: Mood and affect normal.  Nursing note  and vitals reviewed.     Assessment & Plan  Problem List Items Addressed This Visit      Digestive   Gastroesophageal reflux disease without esophagitis   Relevant Medications   omeprazole (PRILOSEC) 20 MG capsule     Endocrine   Diabetes mellitus with no complication (HCC) - Primary   Relevant  Medications   glipiZIDE (GLUCOTROL XL) 10 MG 24 hr tablet   metFORMIN (GLUCOPHAGE) 1000 MG tablet   simvastatin (ZOCOR) 20 MG tablet   ramipril (ALTACE) 5 MG capsule     Other   Familial multiple lipoprotein-type hyperlipidemia   Relevant Medications   metoprolol (LOPRESSOR) 50 MG tablet   simvastatin (ZOCOR) 20 MG tablet   ramipril (ALTACE) 5 MG capsule   H/O: depression   Relevant Medications   PARoxetine (PAXIL) 10 MG tablet    Other Visit Diagnoses    Chronic anxiety        Relevant Medications    PARoxetine (PAXIL) 10 MG tablet    Essential hypertension        Relevant Medications    metoprolol (LOPRESSOR) 50 MG tablet    simvastatin (ZOCOR) 20 MG tablet    ramipril (ALTACE) 5 MG capsule    Hyperlipidemia        Relevant Medications    metoprolol (LOPRESSOR) 50 MG tablet    simvastatin (ZOCOR) 20 MG tablet    ramipril (ALTACE) 5 MG capsule         Dr. Trissa Molina [facility]  03/22/2016

## 2016-05-17 ENCOUNTER — Other Ambulatory Visit: Payer: Self-pay | Admitting: Family Medicine

## 2016-06-11 ENCOUNTER — Other Ambulatory Visit: Payer: Self-pay | Admitting: Family Medicine

## 2016-06-28 ENCOUNTER — Encounter (INDEPENDENT_AMBULATORY_CARE_PROVIDER_SITE_OTHER): Payer: Self-pay

## 2016-06-28 ENCOUNTER — Ambulatory Visit (INDEPENDENT_AMBULATORY_CARE_PROVIDER_SITE_OTHER): Payer: Medicare Other | Admitting: Family Medicine

## 2016-06-28 ENCOUNTER — Encounter: Payer: Self-pay | Admitting: Family Medicine

## 2016-06-28 VITALS — BP 136/80 | HR 76 | Ht 69.0 in | Wt 183.0 lb

## 2016-06-28 DIAGNOSIS — Z8659 Personal history of other mental and behavioral disorders: Secondary | ICD-10-CM

## 2016-06-28 DIAGNOSIS — I10 Essential (primary) hypertension: Secondary | ICD-10-CM | POA: Diagnosis not present

## 2016-06-28 DIAGNOSIS — F419 Anxiety disorder, unspecified: Secondary | ICD-10-CM | POA: Diagnosis not present

## 2016-06-28 DIAGNOSIS — E7849 Other hyperlipidemia: Secondary | ICD-10-CM

## 2016-06-28 DIAGNOSIS — E784 Other hyperlipidemia: Secondary | ICD-10-CM

## 2016-06-28 DIAGNOSIS — E785 Hyperlipidemia, unspecified: Secondary | ICD-10-CM | POA: Diagnosis not present

## 2016-06-28 DIAGNOSIS — K219 Gastro-esophageal reflux disease without esophagitis: Secondary | ICD-10-CM | POA: Diagnosis not present

## 2016-06-28 DIAGNOSIS — E119 Type 2 diabetes mellitus without complications: Secondary | ICD-10-CM | POA: Diagnosis not present

## 2016-06-28 MED ORDER — METOPROLOL TARTRATE 50 MG PO TABS: 50.0000 mg | ORAL_TABLET | Freq: Two times a day (BID) | ORAL | 1 refills | Status: DC

## 2016-06-28 MED ORDER — GLIPIZIDE ER 10 MG PO TB24: 10.0000 mg | ORAL_TABLET | Freq: Every day | ORAL | 1 refills | Status: DC

## 2016-06-28 MED ORDER — PAROXETINE HCL 10 MG PO TABS: 10.0000 mg | ORAL_TABLET | Freq: Every day | ORAL | 1 refills | Status: DC

## 2016-06-28 MED ORDER — METFORMIN HCL 1000 MG PO TABS: 1000.0000 mg | ORAL_TABLET | Freq: Two times a day (BID) | ORAL | 1 refills | Status: AC

## 2016-06-28 MED ORDER — SIMVASTATIN 20 MG PO TABS: 20.0000 mg | ORAL_TABLET | Freq: Every day | ORAL | 1 refills | Status: DC

## 2016-06-28 MED ORDER — OMEPRAZOLE 20 MG PO CPDR: DELAYED_RELEASE_CAPSULE | ORAL | 1 refills | Status: DC

## 2016-06-28 MED ORDER — RAMIPRIL 5 MG PO CAPS: ORAL_CAPSULE | ORAL | 1 refills | Status: DC

## 2016-06-28 NOTE — Progress Notes (Signed)
Name: Bianca Hawkins   MRN: [id number]    DOB: April 21, 1946   Date:06/28/2016       Progress Note  Subjective  Chief Complaint  Chief Complaint  Patient presents with  . Hypertension  . Hyperlipidemia  . Diabetes  . Gastroesophageal Reflux  . Anxiety    Hypertension  This is a chronic problem. The current episode started more than 1 year ago. The problem has been gradually improving since onset. The problem is controlled. Associated symptoms include anxiety. Pertinent negatives include no blurred vision, chest pain, headaches, malaise/fatigue, neck pain, orthopnea, palpitations, peripheral edema, PND, shortness of breath or sweats. There are no associated agents to hypertension. There are no known risk factors for coronary artery disease. Past treatments include ACE inhibitors and beta blockers. The current treatment provides mild improvement. There are no compliance problems.  There is no history of angina, kidney disease, CAD/MI, CVA, heart failure, left ventricular hypertrophy, PVD, renovascular disease or retinopathy. There is no history of chronic renal disease or a hypertension causing med.  Hyperlipidemia  This is a chronic problem. The current episode started more than 1 year ago. The problem is controlled. Recent lipid tests were reviewed and are normal. She has no history of chronic renal disease, diabetes, hypothyroidism, liver disease, obesity or nephrotic syndrome. There are no known factors aggravating her hyperlipidemia. Pertinent negatives include no chest pain, focal sensory loss, focal weakness, myalgias or shortness of breath. Current antihyperlipidemic treatment includes statins. The current treatment provides moderate improvement of lipids. There are no compliance problems.   Diabetes  She presents for her follow-up diabetic visit. She has type 2 diabetes mellitus. Her disease course has been stable. Hypoglycemia symptoms include nervousness/anxiousness. Pertinent  negatives for hypoglycemia include no confusion, dizziness, headaches, hunger, mood changes, pallor, seizures, sleepiness, speech difficulty, sweats or tremors. Pertinent negatives for diabetes include no blurred vision, no chest pain, no fatigue, no foot paresthesias, no foot ulcerations, no polydipsia, no polyphagia, no polyuria, no visual change, no weakness and no weight loss. There are no hypoglycemic complications. Symptoms are stable. There are no diabetic complications. Pertinent negatives for diabetic complications include no CVA, PVD or retinopathy. There are no known risk factors for coronary artery disease. Current diabetic treatment includes oral agent (dual therapy). She is compliant with treatment all of the time. She is following a generally healthy diet. When asked about meal planning, she reported none. She has not had a previous visit with a dietitian. She participates in exercise every other day. Her breakfast blood glucose is taken between 8-9 am. Her breakfast blood glucose range is generally 90-110 mg/dl. An ACE inhibitor/angiotensin II receptor blocker is being taken. She does not see a podiatrist.Eye exam is current.  Gastroesophageal Reflux  She reports no abdominal pain, no belching, no chest pain, no choking, no coughing, no dysphagia, no early satiety, no globus sensation, no heartburn, no hoarse voice, no nausea, no sore throat, no stridor, no tooth decay, no water brash or no wheezing. This is a chronic problem. The current episode started more than 1 year ago. The problem occurs rarely. The problem has been gradually improving. The symptoms are aggravated by certain foods. Pertinent negatives include no fatigue or weight loss. She has tried a PPI for the symptoms. The treatment provided moderate relief.  Anxiety  Presents for follow-up visit. Symptoms include excessive worry and nervous/anxious behavior. Patient reports no chest pain, confusion, dizziness, hyperventilation,  nausea, palpitations or shortness of breath.  No problem-specific Assessment & Plan notes found for this encounter.   Past Medical History:  Diagnosis Date  . Anxiety   . Depression   . Diabetes mellitus without complication ([place])   . GERD (gastroesophageal reflux disease)   . Hyperlipidemia   . Hypertension   . Osteoporosis     Past Surgical History:  Procedure Laterality Date  . BIOPSY BREAST     benign  . BREAST BIOPSY Right 2014   core with clips  . COLONOSCOPY  2008   cleared for 10 years/ occults- 07/22/2015- negative  . INNER EAR SURGERY     benign tumor behind eardrum    Family History  Problem Relation Age of Onset  . Breast cancer Neg Hx     Social History   Social History  . Marital status: Married    Spouse name: N/A  . Number of children: N/A  . Years of education: N/A   Occupational History  . Not on file.   Social History Main Topics  . Smoking status: Never Smoker  . Smokeless tobacco: Not on file  . Alcohol use No  . Drug use: No  . Sexual activity: Not Currently   Other Topics Concern  . Not on file   Social History Narrative  . No narrative on file    Allergies  Allergen Reactions  . Eggs Or Egg-Derived Products   . Other     Pepper  . Sulfa Antibiotics      Review of Systems  Constitutional: Negative for fatigue, malaise/fatigue and weight loss.  HENT: Negative for hoarse voice and sore throat.   Eyes: Negative for blurred vision.  Respiratory: Negative for cough, choking, shortness of breath and wheezing.   Cardiovascular: Negative for chest pain, palpitations, orthopnea and PND.  Gastrointestinal: Negative for abdominal pain, dysphagia, heartburn and nausea.  Musculoskeletal: Negative for myalgias and neck pain.  Skin: Negative for pallor.  Neurological: Negative for dizziness, tremors, focal weakness, seizures, speech difficulty, weakness and headaches.  Endo/Heme/Allergies: Negative for polydipsia and  polyphagia.  Psychiatric/Behavioral: Negative for confusion. The patient is nervous/anxious.      Objective  Vitals:   06/28/16 1002  BP: 136/80  Pulse: 76  Weight: 183 lb (83 kg)  Height: 5\' 9"  (1.753 m)    Physical Exam  Constitutional: She is well-developed, well-nourished, and in no distress. No distress.  HENT:  Head: Normocephalic and atraumatic.  Right Ear: External ear normal.  Left Ear: External ear normal.  Nose: Nose normal.  Mouth/Throat: Oropharynx is clear and moist.  Eyes: Conjunctivae and EOM are normal. Pupils are equal, round, and reactive to light. Right eye exhibits no discharge. Left eye exhibits no discharge.  Neck: Normal range of motion. Neck supple. No JVD present. No thyromegaly present.  Cardiovascular: Normal rate, regular rhythm, normal heart sounds and intact distal pulses.  Exam reveals no gallop and no friction rub.   No murmur heard. Pulmonary/Chest: Effort normal and breath sounds normal. She has no wheezes. She has no rales.  Abdominal: Soft. Bowel sounds are normal. She exhibits no mass. There is no tenderness. There is no guarding.  Musculoskeletal: Normal range of motion. She exhibits no edema.  Lymphadenopathy:    She has no cervical adenopathy.  Neurological: She is alert.  Skin: Skin is warm and dry. She is not diaphoretic.  Psychiatric: Mood and affect normal.  Nursing note and vitals reviewed.     Assessment & Plan  Problem List Items Addressed This Visit  Cardiovascular and Mediastinum   Essential hypertension - Primary   Relevant Medications   simvastatin (ZOCOR) 20 MG tablet   ramipril (ALTACE) 5 MG capsule   metoprolol (LOPRESSOR) 50 MG tablet   Other Relevant Orders   Renal Function Panel     Digestive   Gastroesophageal reflux disease without esophagitis   Relevant Medications   omeprazole (PRILOSEC) 20 MG capsule     Endocrine   Diabetes mellitus with no complication (HCC)   Relevant Medications    simvastatin (ZOCOR) 20 MG tablet   ramipril (ALTACE) 5 MG capsule   metFORMIN (GLUCOPHAGE) 1000 MG tablet   glipiZIDE (GLUCOTROL XL) 10 MG 24 hr tablet   Other Relevant Orders   Hemoglobin A1c   Renal Function Panel   Lipid Profile     Other   Familial multiple lipoprotein-type hyperlipidemia   Relevant Medications   simvastatin (ZOCOR) 20 MG tablet   ramipril (ALTACE) 5 MG capsule   metoprolol (LOPRESSOR) 50 MG tablet   Other Relevant Orders   Lipid Profile   H/O: depression   Relevant Medications   PARoxetine (PAXIL) 10 MG tablet   Chronic anxiety   Relevant Medications   PARoxetine (PAXIL) 10 MG tablet    Other Visit Diagnoses    Hyperlipidemia       Relevant Medications   simvastatin (ZOCOR) 20 MG tablet   ramipril (ALTACE) 5 MG capsule   metoprolol (LOPRESSOR) 50 MG tablet        Dr. Marua Qin [facility]  06/28/16

## 2016-06-29 ENCOUNTER — Other Ambulatory Visit: Payer: Self-pay

## 2016-06-29 DIAGNOSIS — E119 Type 2 diabetes mellitus without complications: Secondary | ICD-10-CM

## 2016-06-29 LAB — HEMOGLOBIN A1C
ESTIMATED AVERAGE GLUCOSE: 163 mg/dL
HEMOGLOBIN A1C: 7.3 % — AB (ref 4.8–5.6)

## 2016-06-29 LAB — LIPID PANEL
CHOLESTEROL TOTAL: 165 mg/dL (ref 100–199)
Chol/HDL Ratio: 2.8 ratio units (ref 0.0–4.4)
HDL: 60 mg/dL (ref >39)
LDL Calculated: 81 mg/dL (ref 0–99)
Triglycerides: 120 mg/dL (ref 0–149)
VLDL CHOLESTEROL CAL: 24 mg/dL (ref 5–40)

## 2016-06-29 LAB — RENAL FUNCTION PANEL
ALBUMIN: 4.5 g/dL (ref 3.5–4.8)
BUN/Creatinine Ratio: 24 (ref 12–28)
BUN: 18 mg/dL (ref 8–27)
CALCIUM: 11.2 mg/dL — AB (ref 8.7–10.3)
CHLORIDE: 100 mmol/L (ref 96–106)
CO2: 26 mmol/L (ref 18–29)
Creatinine, Ser: 0.75 mg/dL (ref 0.57–1.00)
GFR calc Af Amer: 93 mL/min/{1.73_m2} (ref >59)
GFR calc non Af Amer: 81 mL/min/{1.73_m2} (ref >59)
GLUCOSE: 131 mg/dL — AB (ref 65–99)
PHOSPHORUS: 3.1 mg/dL (ref 2.5–4.5)
Potassium: 5.5 mmol/L — ABNORMAL HIGH (ref 3.5–5.2)
Sodium: 142 mmol/L (ref 134–144)

## 2016-09-05 ENCOUNTER — Other Ambulatory Visit: Payer: Self-pay | Admitting: Family Medicine

## 2016-09-13 ENCOUNTER — Ambulatory Visit: Payer: Medicare Other | Admitting: Family Medicine

## 2016-09-25 ENCOUNTER — Encounter: Payer: Self-pay | Admitting: Family Medicine

## 2016-09-25 ENCOUNTER — Ambulatory Visit (INDEPENDENT_AMBULATORY_CARE_PROVIDER_SITE_OTHER): Payer: Medicare Other | Admitting: Family Medicine

## 2016-09-25 VITALS — BP 120/82 | HR 72 | Ht 69.0 in | Wt 183.0 lb

## 2016-09-25 DIAGNOSIS — E784 Other hyperlipidemia: Secondary | ICD-10-CM | POA: Diagnosis not present

## 2016-09-25 DIAGNOSIS — K219 Gastro-esophageal reflux disease without esophagitis: Secondary | ICD-10-CM | POA: Diagnosis not present

## 2016-09-25 DIAGNOSIS — E7800 Pure hypercholesterolemia, unspecified: Secondary | ICD-10-CM

## 2016-09-25 DIAGNOSIS — E119 Type 2 diabetes mellitus without complications: Secondary | ICD-10-CM | POA: Diagnosis not present

## 2016-09-25 DIAGNOSIS — E7849 Other hyperlipidemia: Secondary | ICD-10-CM

## 2016-09-25 DIAGNOSIS — I10 Essential (primary) hypertension: Secondary | ICD-10-CM | POA: Diagnosis not present

## 2016-09-25 DIAGNOSIS — Z23 Encounter for immunization: Secondary | ICD-10-CM

## 2016-09-25 DIAGNOSIS — F419 Anxiety disorder, unspecified: Secondary | ICD-10-CM | POA: Diagnosis not present

## 2016-09-25 DIAGNOSIS — Z8659 Personal history of other mental and behavioral disorders: Secondary | ICD-10-CM | POA: Diagnosis not present

## 2016-09-25 MED ORDER — METOPROLOL TARTRATE 50 MG PO TABS: 50.0000 mg | ORAL_TABLET | Freq: Two times a day (BID) | ORAL | 1 refills | Status: DC

## 2016-09-25 MED ORDER — RAMIPRIL 5 MG PO CAPS: ORAL_CAPSULE | ORAL | 1 refills | Status: DC

## 2016-09-25 MED ORDER — OMEPRAZOLE 20 MG PO CPDR: DELAYED_RELEASE_CAPSULE | ORAL | 1 refills | Status: DC

## 2016-09-25 MED ORDER — PAROXETINE HCL 10 MG PO TABS: 10.0000 mg | ORAL_TABLET | Freq: Every day | ORAL | 1 refills | Status: DC

## 2016-09-25 MED ORDER — SIMVASTATIN 20 MG PO TABS: 20.0000 mg | ORAL_TABLET | Freq: Every day | ORAL | 1 refills | Status: DC

## 2016-09-25 NOTE — Progress Notes (Signed)
Name: Bianca Hawkins   MRN: [id number]    DOB: October 11, 1946   Date:09/25/2016       Progress Note  Subjective  Chief Complaint  Chief Complaint  Patient presents with  . Hypertension  . Hyperlipidemia  . Gastroesophageal Reflux  . Anxiety    Hypertension  This is a chronic problem. The current episode started more than 1 year ago. The problem has been waxing and waning since onset. The problem is controlled. Associated symptoms include anxiety. Pertinent negatives include no blurred vision, chest pain, headaches, malaise/fatigue, neck pain, orthopnea, palpitations, peripheral edema, PND, shortness of breath or sweats. There are no associated agents to hypertension. There are no known risk factors for coronary artery disease. Past treatments include ACE inhibitors and beta blockers. The current treatment provides moderate improvement. There are no compliance problems.  There is no history of angina, kidney disease, CAD/MI, CVA, heart failure, left ventricular hypertrophy, PVD, renovascular disease or retinopathy. There is no history of chronic renal disease or a hypertension causing med.  Hyperlipidemia  This is a chronic problem. The current episode started more than 1 year ago. The problem is controlled. Recent lipid tests were reviewed and are normal. Exacerbating diseases include diabetes. She has no history of chronic renal disease, hypothyroidism, liver disease, obesity or nephrotic syndrome. Factors aggravating her hyperlipidemia include beta blockers. Pertinent negatives include no chest pain, focal sensory loss, focal weakness, leg pain, myalgias or shortness of breath. She is currently on no antihyperlipidemic treatment. The current treatment provides mild improvement of lipids. There are no compliance problems.  Risk factors for coronary artery disease include post-menopausal and hypertension.  Gastroesophageal Reflux  She reports no abdominal pain, no belching, no chest pain, no  choking, no coughing, no dysphagia, no early satiety, no globus sensation, no heartburn, no hoarse voice, no nausea, no sore throat, no stridor, no tooth decay, no water brash or no wheezing. This is a chronic problem. The current episode started more than 1 year ago. The problem has been gradually improving. The symptoms are aggravated by certain foods. Pertinent negatives include no anemia, fatigue, melena, muscle weakness, orthopnea or weight loss. She has tried a PPI for the symptoms. The treatment provided no relief. Past procedures do not include an abdominal ultrasound, esophageal pH monitoring or a UGI.  Anxiety  Presents for follow-up visit. Symptoms include nervous/anxious behavior. Patient reports no chest pain, compulsions, depressed mood, dizziness, excessive worry, insomnia, irritability, malaise, nausea, obsessions, palpitations, panic, restlessness, shortness of breath or suicidal ideas. The severity of symptoms is mild. Nighttime awakenings: none.      No problem-specific Assessment & Plan notes found for this encounter.   Past Medical History:  Diagnosis Date  . Anxiety   . Depression   . Diabetes mellitus without complication ([place])   . GERD (gastroesophageal reflux disease)   . Hyperlipidemia   . Hypertension   . Osteoporosis     Past Surgical History:  Procedure Laterality Date  . BIOPSY BREAST     benign  . BREAST BIOPSY Right 2014   core with clips  . COLONOSCOPY  2008   cleared for 10 years/ occults- 07/22/2015- negative  . INNER EAR SURGERY     benign tumor behind eardrum    Family History  Problem Relation Age of Onset  . Breast cancer Neg Hx     Social History   Social History  . Marital status: Married    Spouse name: N/A  . Number of children: N/A  .  Years of education: N/A   Occupational History  . Not on file.   Social History Main Topics  . Smoking status: Never Smoker  . Smokeless tobacco: Not on file  . Alcohol use No  . Drug use:  No  . Sexual activity: Not Currently   Other Topics Concern  . Not on file   Social History Narrative  . No narrative on file    Allergies  Allergen Reactions  . Eggs Or Egg-Derived Products   . Other     Pepper  . Sulfa Antibiotics      Review of Systems  Constitutional: Negative for chills, fatigue, fever, irritability, malaise/fatigue and weight loss.  HENT: Negative for ear discharge, ear pain, hoarse voice and sore throat.   Eyes: Negative for blurred vision.  Respiratory: Negative for cough, sputum production, choking, shortness of breath and wheezing.   Cardiovascular: Negative for chest pain, palpitations, orthopnea, leg swelling and PND.  Gastrointestinal: Negative for abdominal pain, blood in stool, constipation, diarrhea, dysphagia, heartburn, melena and nausea.  Genitourinary: Negative for dysuria, frequency, hematuria and urgency.  Musculoskeletal: Negative for back pain, joint pain, myalgias, muscle weakness and neck pain.  Skin: Negative for rash.  Neurological: Negative for dizziness, tingling, sensory change, focal weakness and headaches.  Endo/Heme/Allergies: Negative for environmental allergies and polydipsia. Does not bruise/bleed easily.  Psychiatric/Behavioral: Negative for depression and suicidal ideas. The patient is nervous/anxious. The patient does not have insomnia.      Objective  Vitals:   09/25/16 0911  BP: 120/82  Pulse: 72  Weight: 183 lb (83 kg)  Height: 5\' 9"  (1.753 m)    Physical Exam  Constitutional: She is well-developed, well-nourished, and in no distress. No distress.  HENT:  Head: Normocephalic and atraumatic.  Right Ear: External ear normal.  Left Ear: External ear normal.  Nose: Nose normal.  Mouth/Throat: Oropharynx is clear and moist.  Eyes: Conjunctivae and EOM are normal. Pupils are equal, round, and reactive to light. Right eye exhibits no discharge. Left eye exhibits no discharge.  Neck: Normal range of motion. Neck  supple. No JVD present. No thyromegaly present.  Cardiovascular: Normal rate, regular rhythm, normal heart sounds and intact distal pulses.  Exam reveals no gallop and no friction rub.   No murmur heard. Pulmonary/Chest: Effort normal and breath sounds normal. She has no wheezes. She has no rales.  Abdominal: Soft. Bowel sounds are normal. She exhibits no mass. There is no tenderness. There is no guarding and no CVA tenderness.  Musculoskeletal: Normal range of motion. She exhibits no edema.  Lymphadenopathy:    She has no cervical adenopathy.  Neurological: She is alert. She has normal reflexes.  Skin: Skin is warm and dry. She is not diaphoretic.  Psychiatric: Mood and affect normal.  Nursing note and vitals reviewed.     Assessment & Plan  Problem List Items Addressed This Visit      Cardiovascular and Mediastinum   Essential hypertension - Primary   Relevant Medications   metoprolol (LOPRESSOR) 50 MG tablet   ramipril (ALTACE) 5 MG capsule   simvastatin (ZOCOR) 20 MG tablet   Other Relevant Orders   Hepatic function panel     Digestive   Gastroesophageal reflux disease without esophagitis   Relevant Medications   omeprazole (PRILOSEC) 20 MG capsule     Endocrine   Diabetes mellitus with no complication (HCC)   Relevant Medications   linagliptin (TRADJENTA) 5 MG TABS tablet   ramipril (ALTACE) 5 MG capsule  simvastatin (ZOCOR) 20 MG tablet     Other   Familial multiple lipoprotein-type hyperlipidemia   Relevant Medications   metoprolol (LOPRESSOR) 50 MG tablet   ramipril (ALTACE) 5 MG capsule   simvastatin (ZOCOR) 20 MG tablet   Other Relevant Orders   Lipid Profile   Hepatic function panel   H/O: depression   Relevant Medications   PARoxetine (PAXIL) 10 MG tablet   Chronic anxiety   Relevant Medications   PARoxetine (PAXIL) 10 MG tablet    Other Visit Diagnoses    Pure hypercholesterolemia       Relevant Medications   metoprolol (LOPRESSOR) 50 MG  tablet   ramipril (ALTACE) 5 MG capsule   simvastatin (ZOCOR) 20 MG tablet   Immunization due            Dr. Deanna Jones [facility]  09/25/16

## 2016-09-26 LAB — LIPID PANEL
CHOL/HDL RATIO: 2.5 ratio (ref 0.0–4.4)
Cholesterol, Total: 157 mg/dL (ref 100–199)
HDL: 63 mg/dL (ref >39)
LDL CALC: 71 mg/dL (ref 0–99)
Triglycerides: 114 mg/dL (ref 0–149)
VLDL CHOLESTEROL CAL: 23 mg/dL (ref 5–40)

## 2016-09-26 LAB — HEPATIC FUNCTION PANEL
ALBUMIN: 4.5 g/dL (ref 3.5–4.8)
ALT: 25 IU/L (ref 0–32)
AST: 25 IU/L (ref 0–40)
Alkaline Phosphatase: 56 IU/L (ref 39–117)
BILIRUBIN TOTAL: 0.3 mg/dL (ref 0.0–1.2)
Bilirubin, Direct: 0.1 mg/dL (ref 0.00–0.40)
Total Protein: 6.7 g/dL (ref 6.0–8.5)

## 2016-10-15 ENCOUNTER — Encounter: Payer: Self-pay | Admitting: Emergency Medicine

## 2016-10-15 ENCOUNTER — Emergency Department
Admission: EM | Admit: 2016-10-15 | Discharge: 2016-10-15 | Disposition: A | Discharge status: Home / Self Care | Payer: Medicare Other | Attending: Emergency Medicine | Admitting: Emergency Medicine

## 2016-10-15 DIAGNOSIS — Z79899 Other long term (current) drug therapy: Secondary | ICD-10-CM | POA: Diagnosis not present

## 2016-10-15 DIAGNOSIS — R799 Abnormal finding of blood chemistry, unspecified: Secondary | ICD-10-CM | POA: Insufficient documentation

## 2016-10-15 DIAGNOSIS — E119 Type 2 diabetes mellitus without complications: Secondary | ICD-10-CM | POA: Diagnosis not present

## 2016-10-15 DIAGNOSIS — Z7984 Long term (current) use of oral hypoglycemic drugs: Secondary | ICD-10-CM | POA: Diagnosis not present

## 2016-10-15 DIAGNOSIS — J449 Chronic obstructive pulmonary disease, unspecified: Secondary | ICD-10-CM | POA: Diagnosis not present

## 2016-10-15 DIAGNOSIS — I10 Essential (primary) hypertension: Secondary | ICD-10-CM | POA: Diagnosis not present

## 2016-10-15 DIAGNOSIS — R899 Unspecified abnormal finding in specimens from other organs, systems and tissues: Secondary | ICD-10-CM

## 2016-10-15 DIAGNOSIS — Z7982 Long term (current) use of aspirin: Secondary | ICD-10-CM | POA: Diagnosis not present

## 2016-10-15 LAB — COMPREHENSIVE METABOLIC PANEL
ALT: 24 U/L (ref 14–54)
ANION GAP: 6 (ref 5–15)
AST: 29 U/L (ref 15–41)
Albumin: 4.5 g/dL (ref 3.5–5.0)
Alkaline Phosphatase: 50 U/L (ref 38–126)
BILIRUBIN TOTAL: 0.4 mg/dL (ref 0.3–1.2)
BUN: 15 mg/dL (ref 6–20)
CO2: 32 mmol/L (ref 22–32)
Calcium: 11 mg/dL — ABNORMAL HIGH (ref 8.9–10.3)
Chloride: 101 mmol/L (ref 101–111)
Creatinine, Ser: 0.72 mg/dL (ref 0.44–1.00)
Glucose, Bld: 93 mg/dL (ref 65–99)
POTASSIUM: 4.7 mmol/L (ref 3.5–5.1)
Sodium: 139 mmol/L (ref 135–145)
TOTAL PROTEIN: 7.2 g/dL (ref 6.5–8.1)

## 2016-10-15 LAB — CBC WITH DIFFERENTIAL/PLATELET
Basophils Absolute: 0.1 10*3/uL (ref 0–0.1)
Basophils Relative: 1 %
EOS PCT: 2 %
Eosinophils Absolute: 0.2 10*3/uL (ref 0–0.7)
HEMATOCRIT: 37.9 % (ref 35.0–47.0)
Hemoglobin: 12.9 g/dL (ref 12.0–16.0)
LYMPHS ABS: 2.2 10*3/uL (ref 1.0–3.6)
LYMPHS PCT: 24 %
MCH: 29.8 pg (ref 26.0–34.0)
MCHC: 33.9 g/dL (ref 32.0–36.0)
MCV: 88 fL (ref 80.0–100.0)
MONO ABS: 0.6 10*3/uL (ref 0.2–0.9)
MONOS PCT: 7 %
NEUTROS ABS: 6.3 10*3/uL (ref 1.4–6.5)
Neutrophils Relative %: 66 %
PLATELETS: 120 10*3/uL — AB (ref 150–440)
RBC: 4.31 MIL/uL (ref 3.80–5.20)
RDW: 13.8 % (ref 11.5–14.5)
WBC: 9.4 10*3/uL (ref 3.6–11.0)

## 2016-10-15 LAB — TROPONIN I

## 2016-10-15 NOTE — ED Provider Notes (Signed)
[facility] Emergency Department Provider Note   ____________________________________________    I have reviewed the triage vital signs and the nursing notes.   HISTORY  Chief Complaint Abnormal Lab     HPI Bianca Hawkins is a 70 y.o. female who was sent in by her endocrinologist for lab abnormality. Reportedly patient had labs drawn today and her potassium was elevated at 6.1. The patient has no physical complaints and feels quite well. She was called by her endocrinologist and told to come to the emergency department. Patient was having labs drawn because of changes in her diabetes medication   Past Medical History:  Diagnosis Date  . Anxiety   . Depression   . Diabetes mellitus without complication ([place])   . GERD (gastroesophageal reflux disease)   . Hyperlipidemia   . Hypertension   . Osteoporosis     Patient Active Problem List   Diagnosis Date Noted  . Essential hypertension 06/28/2016  . Chronic anxiety 06/28/2016  . Diabetes mellitus with no complication ([place]) 123456  . Familial multiple lipoprotein-type hyperlipidemia 08/09/2015  . Pain in joint 08/09/2015  . Encounter for general adult medical examination without abnormal findings 08/09/2015  . Chronic obstructive pulmonary disease (COPD) ([place]) 08/09/2015  . H/O: depression 08/09/2015  . Gastroesophageal reflux disease without esophagitis 08/09/2015  . Blood glucose elevated 08/09/2015  . Encounter for screening for osteoporosis 08/09/2015  . Need for vaccination 08/09/2015  . Chronic MEE (middle ear effusion) 08/09/2015  . 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 BIOPSY BREAST     benign  . BREAST BIOPSY Right 2014   core with clips  . COLONOSCOPY  2008   cleared for 10 years/ occults- 07/22/2015- negative  . INNER EAR SURGERY     benign tumor behind eardrum    Prior to Admission medications     Medication Sig Start Date End Date Taking? Authorizing Provider  ACCU-CHEK AVIVA PLUS test strip USE AS DIRECTED 09/06/16   Juline Patch, MD  aspirin 81 MG tablet Take 1 tablet by mouth daily.    Historical Provider, MD  fluticasone (FLONASE) 50 MCG/ACT nasal spray Place 1 spray into both nostrils as needed. 12/16/14   Historical Provider, MD  glipiZIDE (GLUCOTROL XL) 10 MG 24 hr tablet Take 1 tablet (10 mg total) by mouth daily. Patient taking differently: Take 10 mg by mouth daily. Dr Maretta Bees 06/28/16   Juline Patch, MD  linagliptin (TRADJENTA) 5 MG TABS tablet Take 5 mg by mouth daily. Dr Maretta Bees    Historical Provider, MD  metFORMIN (GLUCOPHAGE) 1000 MG tablet Take 1 tablet (1,000 mg total) by mouth 2 (two) times daily. Patient taking differently: Take 1,000 mg by mouth 2 (two) times daily. Dr Maretta Bees 06/28/16   Juline Patch, MD  metoprolol (LOPRESSOR) 50 MG tablet Take 1 tablet (50 mg total) by mouth 2 (two) times daily. 09/25/16   Juline Patch, MD  Multiple Vitamins-Minerals (CENTRUM SILVER ADULT 50+) TABS Take 1 tablet by mouth daily.    Historical Provider, MD  omeprazole (PRILOSEC) 20 MG capsule TAKE 1 CAPSULE BY MOUTH EVERY DAY 09/25/16   Juline Patch, MD  PARoxetine (PAXIL) 10 MG tablet Take 1 tablet (10 mg total) by mouth daily. 09/25/16   Juline Patch, MD  ramipril (ALTACE) 5 MG capsule TAKE 1 CAPSULE BY MOUTH EVERY DAY 09/25/16   Juline Patch, MD  simvastatin (ZOCOR) 20  MG tablet Take 1 tablet (20 mg total) by mouth daily. 09/25/16   Juline Patch, MD     Allergies Eggs or egg-derived products; Other; and Sulfa antibiotics  Family History  Problem Relation Age of Onset  . Breast cancer Neg Hx     Social History Social History  Substance Use Topics  . Smoking status: Never Smoker  . Smokeless tobacco: Never Used  . Alcohol use No    Review of Systems  Constitutional: NoDizziness    Cardiovascular: Denies chest pain.No palpitations Respiratory: Denies shortness  of breath. Gastrointestinal: No abdominal pain.    Neurological: Negative for headaches or weakness   ____________________________________________   PHYSICAL EXAM:  VITAL SIGNS: ED Triage Vitals  Enc Vitals Group     BP 10/15/16 1833 (!) 165/94     Pulse Rate 10/15/16 1833 77     Resp 10/15/16 1833 18     Temp 10/15/16 1834 98 F (36.7 C)     Temp Source 10/15/16 1834 Oral     SpO2 10/15/16 1833 97 %     Weight --      Height --      Head Circumference --      Peak Flow --      Pain Score 10/15/16 2030 0     Pain Loc --      Pain Edu? --      Excl. in [place]? --     Constitutional: Alert and oriented. No acute distress. Pleasant and interactive Eyes: Conjunctivae are normal.   Nose: No congestion/rhinnorhea.  Cardiovascular: Normal rate, regular rhythm. Grossly normal heart sounds.  Good peripheral circulation. Respiratory: Normal respiratory effort.  No retractions. Lungs CTAB.   Neurologic:  Normal speech and language. .  Skin:  Skin is warm, dry and intact. No rash noted. Psychiatric: Mood and affect are normal. Speech and behavior are normal.  ____________________________________________   LABS (all labs ordered are listed, but only abnormal results are displayed)  Labs Reviewed  CBC WITH DIFFERENTIAL/PLATELET - Abnormal; Notable for the following:       Result Value   Platelets 120 (*)    All other components within normal limits  COMPREHENSIVE METABOLIC PANEL - Abnormal; Notable for the following:    Calcium 11.0 (*)    All other components within normal limits  TROPONIN I   ____________________________________________  EKG  ED ECG REPORT I, Lavonia Drafts, the attending physician, personally viewed and interpreted this ECG.  Date: 10/15/2016  Rhythm: normal sinus rhythm QRS Axis: normal Intervals: normal ST/T Wave abnormalities: normal Conduction Disturbances: Right bundle branch block, old Narrative Interpretation:  unremarkable  ____________________________________________  RADIOLOGY  None ____________________________________________   PROCEDURES  Procedure(s) performed: No    Critical Care performed:No ____________________________________________   INITIAL IMPRESSION / ASSESSMENT AND PLAN / ED COURSE  Pertinent labs & imaging results that were available during my care of the patient were reviewed by me and considered in my medical decision making (see chart for details).  Potassium is normal in the ED. EKG is unchanged from prior, patient has no complaints. Appropriate for discharge, suspect lab error/hemolysis  Clinical Course    ____________________________________________   FINAL CLINICAL IMPRESSION(S) / ED DIAGNOSES  Final diagnoses:  Abnormal laboratory test      NEW MEDICATIONS STARTED DURING THIS VISIT:  Discharge Medication List as of 10/15/2016  8:23 PM       Note:  This document was prepared using Dragon voice recognition software and may include unintentional  dictation errors.    Lavonia Drafts, MD 10/15/16 2214

## 2016-10-15 NOTE — Discharge Instructions (Signed)
Your potassium was normal in the ED. Your EKG is unchanged from prior

## 2016-10-15 NOTE — ED Triage Notes (Signed)
Reports having blood drawn this am, MD called and told her to come in for high potassium.  Denies any sx.

## 2016-11-19 ENCOUNTER — Other Ambulatory Visit: Payer: Self-pay

## 2016-11-19 DIAGNOSIS — Z1239 Encounter for other screening for malignant neoplasm of breast: Secondary | ICD-10-CM

## 2016-11-24 ENCOUNTER — Other Ambulatory Visit: Payer: Self-pay | Admitting: Family Medicine

## 2016-11-30 ENCOUNTER — Other Ambulatory Visit: Payer: Self-pay

## 2016-11-30 MED ORDER — AZITHROMYCIN 250 MG PO TABS: ORAL_TABLET | ORAL | 0 refills | Status: DC

## 2016-12-03 ENCOUNTER — Ambulatory Visit (INDEPENDENT_AMBULATORY_CARE_PROVIDER_SITE_OTHER): Payer: Medicare Other | Admitting: Family Medicine

## 2016-12-03 ENCOUNTER — Encounter: Payer: Self-pay | Admitting: Family Medicine

## 2016-12-03 VITALS — BP 120/82 | HR 64 | Ht 63.0 in | Wt 183.0 lb

## 2016-12-03 DIAGNOSIS — J0100 Acute maxillary sinusitis, unspecified: Secondary | ICD-10-CM

## 2016-12-03 NOTE — Progress Notes (Signed)
Name: Bianca Hawkins   MRN: [id number]    DOB: Dec 08, 1945   Date:12/03/2016       Progress Note  Subjective  Chief Complaint  Chief Complaint  Patient presents with  . Sinusitis    started ZPack on Friday- feeling better    Sinusitis  This is a new problem. The current episode started in the past 7 days. The problem has been gradually improving since onset. There has been no fever. Associated symptoms include congestion, coughing and sinus pressure. Pertinent negatives include no chills, diaphoresis, ear pain, headaches, hoarse voice, neck pain, shortness of breath, sore throat or swollen glands. Past treatments include nothing. The treatment provided moderate relief.    No problem-specific Assessment & Plan notes found for this encounter.   Past Medical History:  Diagnosis Date  . Anxiety   . Depression   . Diabetes mellitus without complication ([place])   . GERD (gastroesophageal reflux disease)   . Hyperlipidemia   . Hypertension   . Osteoporosis     Past Surgical History:  Procedure Laterality Date  . BIOPSY BREAST     benign  . BREAST BIOPSY Right 2014   core with clips  . COLONOSCOPY  2008   cleared for 10 years/ occults- 07/22/2015- negative  . INNER EAR SURGERY     benign tumor behind eardrum    Family History  Problem Relation Age of Onset  . Breast cancer Neg Hx     Social History   Social History  . Marital status: Married    Spouse name: N/A  . Number of children: N/A  . Years of education: N/A   Occupational History  . Not on file.   Social History Main Topics  . Smoking status: Never Smoker  . Smokeless tobacco: Never Used  . Alcohol use No  . Drug use: No  . Sexual activity: Not Currently   Other Topics Concern  . Not on file   Social History Narrative  . No narrative on file    Allergies  Allergen Reactions  . Eggs Or Egg-Derived Products   . Other     Pepper  . Sulfa Antibiotics      Review of Systems   Constitutional: Negative for chills, diaphoresis, fever, malaise/fatigue and weight loss.  HENT: Positive for congestion and sinus pressure. Negative for ear discharge, ear pain, hoarse voice and sore throat.   Eyes: Negative for blurred vision.  Respiratory: Positive for cough. Negative for sputum production, shortness of breath and wheezing.   Cardiovascular: Negative for chest pain, palpitations and leg swelling.  Gastrointestinal: Negative for abdominal pain, blood in stool, constipation, diarrhea, heartburn, melena and nausea.  Genitourinary: Negative for dysuria, frequency, hematuria and urgency.  Musculoskeletal: Negative for back pain, joint pain, myalgias and neck pain.  Skin: Negative for rash.  Neurological: Negative for dizziness, tingling, sensory change, focal weakness and headaches.  Endo/Heme/Allergies: Negative for environmental allergies and polydipsia. Does not bruise/bleed easily.  Psychiatric/Behavioral: Negative for depression and suicidal ideas. The patient is not nervous/anxious and does not have insomnia.      Objective  Vitals:   12/03/16 1043  BP: 120/82  Pulse: 64  Weight: 183 lb (83 kg)  Height: 5\' 3"  (1.6 m)    Physical Exam  Constitutional: She is well-developed, well-nourished, and in no distress. No distress.  HENT:  Head: Normocephalic and atraumatic.  Right Ear: External ear normal.  Left Ear: External ear normal.  Nose: Nose normal.  Mouth/Throat: Oropharynx is clear  and moist.  Eyes: Conjunctivae and EOM are normal. Pupils are equal, round, and reactive to light. Right eye exhibits no discharge. Left eye exhibits no discharge.  Neck: Normal range of motion. Neck supple. No JVD present. No thyromegaly present.  Cardiovascular: Normal rate, regular rhythm, normal heart sounds and intact distal pulses.  Exam reveals no gallop and no friction rub.   No murmur heard. Pulmonary/Chest: Effort normal and breath sounds normal. She has no wheezes. She  has no rales.  Abdominal: Soft. Bowel sounds are normal. She exhibits no mass. There is no tenderness. There is no guarding.  Musculoskeletal: Normal range of motion. She exhibits no edema.  Lymphadenopathy:    She has no cervical adenopathy.  Neurological: She is alert. She has normal reflexes.  Skin: Skin is warm and dry. She is not diaphoretic.  Psychiatric: Mood and affect normal.  Nursing note and vitals reviewed.     Assessment & Plan  Problem List Items Addressed This Visit    None    Visit Diagnoses    Acute maxillary sinusitis, recurrence not specified    -  Primary   cont azithromycin   Relevant Medications   cetirizine (ZYRTEC) 5 MG tablet        Dr. Bernhard Koskinen [facility]  12/03/16

## 2016-12-20 ENCOUNTER — Ambulatory Visit
Admission: RE | Admit: 2016-12-20 | Discharge: 2016-12-20 | Disposition: A | Discharge status: Home / Self Care | Payer: Medicare Other | Source: Ambulatory Visit | Attending: Family Medicine | Admitting: Family Medicine

## 2016-12-20 DIAGNOSIS — Z1231 Encounter for screening mammogram for malignant neoplasm of breast: Secondary | ICD-10-CM | POA: Insufficient documentation

## 2016-12-20 DIAGNOSIS — Z1239 Encounter for other screening for malignant neoplasm of breast: Secondary | ICD-10-CM

## 2016-12-25 ENCOUNTER — Other Ambulatory Visit: Payer: Self-pay | Admitting: Family Medicine

## 2017-01-28 ENCOUNTER — Telehealth: Payer: Self-pay

## 2017-01-28 DIAGNOSIS — I10 Essential (primary) hypertension: Secondary | ICD-10-CM

## 2017-01-28 NOTE — Telephone Encounter (Signed)
Patient picked up RX written 09/2016 and it was Metoprolol 90 count with 1 refill. Instructions correct state take 2 times daily but it is a 45 day count and she said it should be 90. I called pharmacy and they have 90 more pills on file but patient asked that she not get those she wants a new Rx for 180 tabs to cover 90 days like originally was supposed to be. She told me it will be fine to do the other 90 and then 180 but pharmacy said she refused to have the other 90 pills until we send 180. I advised both patient and pharmacy that we will call them back,

## 2017-01-29 MED ORDER — METOPROLOL TARTRATE 50 MG PO TABS: 50.0000 mg | ORAL_TABLET | Freq: Two times a day (BID) | ORAL | 0 refills | Status: DC

## 2017-01-29 NOTE — Telephone Encounter (Signed)
I sent in 180 to pharm this am

## 2017-02-01 ENCOUNTER — Encounter: Payer: Self-pay | Admitting: Family Medicine

## 2017-02-01 ENCOUNTER — Ambulatory Visit (INDEPENDENT_AMBULATORY_CARE_PROVIDER_SITE_OTHER): Payer: Medicare Other | Admitting: Family Medicine

## 2017-02-01 VITALS — BP 120/70 | HR 88 | Ht 63.0 in | Wt 185.0 lb

## 2017-02-01 DIAGNOSIS — J302 Other seasonal allergic rhinitis: Secondary | ICD-10-CM

## 2017-02-01 DIAGNOSIS — J0100 Acute maxillary sinusitis, unspecified: Secondary | ICD-10-CM | POA: Diagnosis not present

## 2017-02-01 MED ORDER — AZITHROMYCIN 250 MG PO TABS: ORAL_TABLET | ORAL | 1 refills | Status: DC

## 2017-02-01 NOTE — Progress Notes (Signed)
Name: Bianca Hawkins   MRN: [id number]    DOB: 14-Jan-1946   Date:02/01/2017       Progress Note  Subjective  Chief Complaint  Chief Complaint  Patient presents with  . Sinusitis    cough and cong- dripping nose, earache and sore throat-had a ZPack in Jan from 1/22-1/27    Sinusitis  This is a recurrent problem. The current episode started 1 to 4 weeks ago. The problem has been gradually worsening since onset. There has been no fever. The pain is moderate. Associated symptoms include congestion, coughing, ear pain, sinus pressure and a sore throat. Pertinent negatives include no chills, diaphoresis, headaches, hoarse voice, neck pain, shortness of breath, sneezing or swollen glands. Past treatments include nothing. The treatment provided moderate relief.    No problem-specific Assessment & Plan notes found for this encounter.   Past Medical History:  Diagnosis Date  . Anxiety   . Depression   . Diabetes mellitus without complication ([place])   . GERD (gastroesophageal reflux disease)   . Hyperlipidemia   . Hypertension   . Osteoporosis     Past Surgical History:  Procedure Laterality Date  . BIOPSY BREAST     benign  . BREAST BIOPSY Right 2014   core with clips  . COLONOSCOPY  2008   cleared for 10 years/ occults- 07/22/2015- negative  . INNER EAR SURGERY     benign tumor behind eardrum    Family History  Problem Relation Age of Onset  . Breast cancer Neg Hx     Social History   Social History  . Marital status: Married    Spouse name: N/A  . Number of children: N/A  . Years of education: N/A   Occupational History  . Not on file.   Social History Main Topics  . Smoking status: Never Smoker  . Smokeless tobacco: Never Used  . Alcohol use No  . Drug use: No  . Sexual activity: Not Currently   Other Topics Concern  . Not on file   Social History Narrative  . No narrative on file    Allergies  Allergen Reactions  . Eggs Or Egg-Derived Products    . Other     Pepper  . Sulfa Antibiotics     Outpatient Medications Prior to Visit  Medication Sig Dispense Refill  . ACCU-CHEK AVIVA PLUS test strip USE AS DIRECTED 100 each 0  . aspirin 81 MG tablet Take 1 tablet by mouth daily.    . cetirizine (ZYRTEC) 5 MG tablet Take 1 tablet by mouth as needed.    . fluticasone (FLONASE) 50 MCG/ACT nasal spray Place 1 spray into both nostrils as needed.    . metFORMIN (GLUCOPHAGE) 1000 MG tablet Take 1 tablet (1,000 mg total) by mouth 2 (two) times daily. (Patient taking differently: Take 1,000 mg by mouth 2 (two) times daily. Dr Maretta Bees) 180 tablet 1  . metoprolol (LOPRESSOR) 50 MG tablet Take 1 tablet (50 mg total) by mouth 2 (two) times daily. 180 tablet 0  . omeprazole (PRILOSEC) 20 MG capsule TAKE 1 CAPSULE BY MOUTH EVERY DAY 90 capsule 1  . PARoxetine (PAXIL) 10 MG tablet Take 1 tablet (10 mg total) by mouth daily. 90 tablet 1  . ramipril (ALTACE) 5 MG capsule TAKE 1 CAPSULE BY MOUTH EVERY DAY 90 capsule 1  . saxagliptin HCl (ONGLYZA) 5 MG TABS tablet Take 1 tablet by mouth daily. Dr Maretta Bees    . simvastatin (ZOCOR) 20 MG tablet Take  1 tablet (20 mg total) by mouth daily. 90 tablet 1  . azithromycin (ZITHROMAX) 250 MG tablet Use as directed 6 tablet 0  . glipiZIDE (GLUCOTROL XL) 10 MG 24 hr tablet Take 1 tablet (10 mg total) by mouth daily. (Patient not taking: Reported on 02/01/2017) 90 tablet 1  . Multiple Vitamins-Minerals (CENTRUM SILVER ADULT 50+) TABS Take 1 tablet by mouth daily.     No facility-administered medications prior to visit.     Review of Systems  Constitutional: Negative for chills, diaphoresis, fever, malaise/fatigue and weight loss.  HENT: Positive for congestion, ear pain, sinus pressure and sore throat. Negative for ear discharge, hoarse voice and sneezing.   Eyes: Negative for blurred vision.  Respiratory: Positive for cough. Negative for sputum production, shortness of breath and wheezing.   Cardiovascular: Negative for  chest pain, palpitations and leg swelling.  Gastrointestinal: Negative for abdominal pain, blood in stool, constipation, diarrhea, heartburn, melena and nausea.  Genitourinary: Negative for dysuria, frequency, hematuria and urgency.  Musculoskeletal: Negative for back pain, joint pain, myalgias and neck pain.  Skin: Negative for rash.  Neurological: Negative for dizziness, tingling, sensory change, focal weakness and headaches.  Endo/Heme/Allergies: Negative for environmental allergies and polydipsia. Does not bruise/bleed easily.  Psychiatric/Behavioral: Negative for depression and suicidal ideas. The patient is not nervous/anxious and does not have insomnia.      Objective  Vitals:   02/01/17 1406  BP: 120/70  Pulse: 88  Weight: 185 lb (83.9 kg)  Height: 5\' 3"  (1.6 m)    Physical Exam  Constitutional: She is well-developed, well-nourished, and in no distress. No distress.  HENT:  Head: Normocephalic and atraumatic.  Right Ear: External ear normal.  Left Ear: External ear normal.  Nose: Nose normal.  Mouth/Throat: Oropharynx is clear and moist.  Eyes: Conjunctivae and EOM are normal. Pupils are equal, round, and reactive to light. Right eye exhibits no discharge. Left eye exhibits no discharge.  Neck: Normal range of motion. Neck supple. No JVD present. No thyromegaly present.  Cardiovascular: Normal rate, regular rhythm, normal heart sounds and intact distal pulses.  Exam reveals no gallop and no friction rub.   No murmur heard. Pulmonary/Chest: Effort normal and breath sounds normal. She has no wheezes. She has no rales.  Abdominal: Soft. Bowel sounds are normal. She exhibits no mass. There is no tenderness. There is no guarding.  Musculoskeletal: Normal range of motion. She exhibits no edema.  Lymphadenopathy:    She has no cervical adenopathy.  Neurological: She is alert. She has normal reflexes.  Skin: Skin is warm and dry. She is not diaphoretic.  Psychiatric: Mood and  affect normal.  Nursing note and vitals reviewed.     Assessment & Plan  Problem List Items Addressed This Visit    None    Visit Diagnoses    Acute maxillary sinusitis, recurrence not specified    -  Primary   Relevant Medications   azithromycin (ZITHROMAX) 250 MG tablet   Acute seasonal allergic rhinitis, unspecified trigger       Relevant Medications   azithromycin (ZITHROMAX) 250 MG tablet      Meds ordered this encounter  Medications  . azithromycin (ZITHROMAX) 250 MG tablet    Sig: 2 today then 1 a day for 4 days    Dispense:  6 tablet    Refill:  1      Dr. Macon [facility]  02/01/17

## 2017-03-01 DIAGNOSIS — E042 Nontoxic multinodular goiter: Secondary | ICD-10-CM | POA: Insufficient documentation

## 2017-03-01 DIAGNOSIS — E210 Primary hyperparathyroidism: Secondary | ICD-10-CM | POA: Insufficient documentation

## 2017-04-12 HISTORY — PX: THYROID EXPLORATION: SHX5280

## 2017-04-16 DIAGNOSIS — F410 Panic disorder [episodic paroxysmal anxiety] without agoraphobia: Secondary | ICD-10-CM | POA: Insufficient documentation

## 2017-05-06 ENCOUNTER — Other Ambulatory Visit: Payer: Self-pay | Admitting: Family Medicine

## 2017-05-06 DIAGNOSIS — I10 Essential (primary) hypertension: Secondary | ICD-10-CM

## 2017-05-28 ENCOUNTER — Ambulatory Visit (INDEPENDENT_AMBULATORY_CARE_PROVIDER_SITE_OTHER): Payer: Medicare Other | Admitting: Family Medicine

## 2017-05-28 ENCOUNTER — Ambulatory Visit
Admission: RE | Admit: 2017-05-28 | Discharge: 2017-05-28 | Disposition: A | Discharge status: Home / Self Care | Payer: Medicare Other | Source: Ambulatory Visit | Attending: Family Medicine | Admitting: Family Medicine

## 2017-05-28 ENCOUNTER — Encounter: Payer: Self-pay | Admitting: Family Medicine

## 2017-05-28 VITALS — BP 130/84 | HR 80 | Ht 63.0 in | Wt 185.0 lb

## 2017-05-28 DIAGNOSIS — E119 Type 2 diabetes mellitus without complications: Secondary | ICD-10-CM | POA: Diagnosis not present

## 2017-05-28 DIAGNOSIS — X501XXA Overexertion from prolonged static or awkward postures, initial encounter: Secondary | ICD-10-CM | POA: Diagnosis not present

## 2017-05-28 DIAGNOSIS — S8264XA Nondisplaced fracture of lateral malleolus of right fibula, initial encounter for closed fracture: Secondary | ICD-10-CM | POA: Insufficient documentation

## 2017-05-28 DIAGNOSIS — S93401A Sprain of unspecified ligament of right ankle, initial encounter: Secondary | ICD-10-CM | POA: Diagnosis not present

## 2017-05-28 NOTE — Progress Notes (Signed)
Name: Bianca Hawkins   MRN: [id number]    DOB: Jul 01, 1946   Date:05/28/2017       Progress Note  Subjective  Chief Complaint  Chief Complaint  Patient presents with  . Ankle Pain    twisted R) ankle x 2 days ago when her foot slid out of sandle and twisted. Swelling despite ice. No pain unless Outside of foot presses against the bed    Ankle Pain   The incident occurred 3 to 5 days ago. The incident occurred at home. The injury mechanism was a fall and a twisting injury. The quality of the pain is described as aching. The pain is at a severity of 4/10. The pain is moderate. The pain has been constant since onset. Pertinent negatives include no inability to bear weight, loss of motion or tingling. The symptoms are aggravated by movement and weight bearing. She has tried acetaminophen (bucket of ice water) for the symptoms.  Diabetes  She presents for her follow-up diabetic visit. She has type 2 diabetes mellitus. Her disease course has been stable. There are no hypoglycemic associated symptoms. Pertinent negatives for hypoglycemia include no confusion, dizziness, headaches or nervousness/anxiousness. Pertinent negatives for diabetes include no blurred vision, no chest pain, no fatigue, no foot paresthesias, no foot ulcerations, no polydipsia, no polyphagia, no polyuria, no visual change, no weakness and no weight loss. There are no hypoglycemic complications. Symptoms are worsening. Pertinent negatives for diabetic complications include no autonomic neuropathy, CVA, heart disease, impotence, nephropathy, peripheral neuropathy, PVD or retinopathy. Current diabetic treatment includes oral agent (dual therapy) (metformin/onglyza). She is compliant with treatment all of the time.    No problem-specific Assessment & Plan notes found for this encounter.   Past Medical History:  Diagnosis Date  . Anxiety   . Depression   . Diabetes mellitus without complication ([place])   . GERD  (gastroesophageal reflux disease)   . Hyperlipidemia   . Hypertension   . Osteoporosis     Past Surgical History:  Procedure Laterality Date  . BIOPSY BREAST     benign  . BREAST BIOPSY Right 2014   core with clips  . COLONOSCOPY  2008   cleared for 10 years/ occults- 07/22/2015- negative  . INNER EAR SURGERY     benign tumor behind eardrum    Family History  Problem Relation Age of Onset  . Breast cancer Neg Hx     Social History   Social History  . Marital status: Married    Spouse name: N/A  . Number of children: N/A  . Years of education: N/A   Occupational History  . Not on file.   Social History Main Topics  . Smoking status: Never Smoker  . Smokeless tobacco: Never Used  . Alcohol use No  . Drug use: No  . Sexual activity: Not Currently   Other Topics Concern  . Not on file   Social History Narrative  . No narrative on file    Allergies  Allergen Reactions  . Eggs Or Egg-Derived Products   . Other     Pepper  . Sulfa Antibiotics     Outpatient Medications Prior to Visit  Medication Sig Dispense Refill  . ACCU-CHEK AVIVA PLUS test strip USE AS DIRECTED 100 each 0  . aspirin 81 MG tablet Take 1 tablet by mouth daily.    . cetirizine (ZYRTEC) 5 MG tablet Take 1 tablet by mouth as needed.    . fluticasone (FLONASE) 50 MCG/ACT nasal spray  Place 1 spray into both nostrils as needed.    Marland Kitchen glipiZIDE (GLUCOTROL XL) 2.5 MG 24 hr tablet Take 2.5 mg by mouth daily with breakfast. Dr Maretta Bees    . metFORMIN (GLUCOPHAGE) 1000 MG tablet Take 1 tablet (1,000 mg total) by mouth 2 (two) times daily. (Patient taking differently: Take 1,000 mg by mouth 2 (two) times daily. Dr Maretta Bees) 180 tablet 1  . metoprolol tartrate (LOPRESSOR) 50 MG tablet TAKE ONE TABLET BY MOUTH TWICE DAILY 180 tablet 3  . omeprazole (PRILOSEC) 20 MG capsule TAKE 1 CAPSULE BY MOUTH EVERY DAY 90 capsule 1  . PARoxetine (PAXIL) 10 MG tablet Take 1 tablet (10 mg total) by mouth daily. 90 tablet 1  .  ramipril (ALTACE) 5 MG capsule TAKE 1 CAPSULE BY MOUTH EVERY DAY 90 capsule 1  . saxagliptin HCl (ONGLYZA) 5 MG TABS tablet Take 1 tablet by mouth daily. Dr Maretta Bees    . simvastatin (ZOCOR) 20 MG tablet Take 1 tablet (20 mg total) by mouth daily. 90 tablet 1  . azithromycin (ZITHROMAX) 250 MG tablet 2 today then 1 a day for 4 days 6 tablet 1   No facility-administered medications prior to visit.     Review of Systems  Constitutional: Negative for chills, fatigue, fever, malaise/fatigue and weight loss.  HENT: Negative for ear discharge, ear pain and sore throat.   Eyes: Negative for blurred vision.  Respiratory: Negative for cough, sputum production, shortness of breath and wheezing.   Cardiovascular: Negative for chest pain, palpitations and leg swelling.  Gastrointestinal: Negative for abdominal pain, blood in stool, constipation, diarrhea, heartburn, melena and nausea.  Genitourinary: Negative for dysuria, frequency, hematuria, impotence and urgency.  Musculoskeletal: Negative for back pain, joint pain, myalgias and neck pain.  Skin: Negative for rash.  Neurological: Negative for dizziness, tingling, sensory change, focal weakness, weakness and headaches.  Endo/Heme/Allergies: Negative for environmental allergies, polydipsia and polyphagia. Does not bruise/bleed easily.  Psychiatric/Behavioral: Negative for confusion, depression and suicidal ideas. The patient is not nervous/anxious and does not have insomnia.      Objective  Vitals:   05/28/17 1003  BP: 130/84  Pulse: 80  Weight: 185 lb (83.9 kg)  Height: 5\' 3"  (1.6 m)    Physical Exam  Constitutional: She is well-developed, well-nourished, and in no distress. No distress.  HENT:  Head: Normocephalic and atraumatic.  Right Ear: External ear normal.  Left Ear: External ear normal.  Nose: Nose normal.  Mouth/Throat: Oropharynx is clear and moist.  Eyes: Pupils are equal, round, and reactive to light. Conjunctivae and EOM are  normal. Right eye exhibits no discharge. Left eye exhibits no discharge.  Neck: Normal range of motion. Neck supple. No JVD present. No thyromegaly present.  Cardiovascular: Normal rate, regular rhythm, normal heart sounds and intact distal pulses.  Exam reveals no gallop and no friction rub.   No murmur heard. Pulmonary/Chest: Effort normal and breath sounds normal. She has no wheezes. She has no rales.  Abdominal: Soft. Bowel sounds are normal. She exhibits no mass. There is no tenderness. There is no guarding.  Musculoskeletal: Normal range of motion. She exhibits no edema.       Right ankle: She exhibits swelling. Tenderness. Lateral malleolus and AITFL tenderness found. No CF ligament, no posterior TFL, no head of 5th metatarsal and no proximal fibula tenderness found.  Lymphadenopathy:    She has no cervical adenopathy.  Neurological: She is alert. She has normal reflexes.  Skin: Skin is warm and dry.  She is not diaphoretic.  Psychiatric: Mood and affect normal.  Nursing note and vitals reviewed.     Assessment & Plan  Problem List Items Addressed This Visit      Endocrine   Diabetes mellitus with no complication ([place])    Other Visit Diagnoses    Sprain of right ankle, unspecified ligament, initial encounter    -  Primary   ice/compression /elevation   Relevant Orders   DG Ankle Complete Right      No orders of the defined types were placed in this encounter.     Dr. [facility]  05/28/17

## 2017-06-10 ENCOUNTER — Other Ambulatory Visit: Payer: Self-pay | Admitting: Family Medicine

## 2017-06-10 DIAGNOSIS — I10 Essential (primary) hypertension: Secondary | ICD-10-CM

## 2017-08-05 ENCOUNTER — Encounter: Payer: Self-pay | Admitting: Family Medicine

## 2017-08-19 ENCOUNTER — Other Ambulatory Visit: Payer: Self-pay

## 2017-09-09 DIAGNOSIS — E1169 Type 2 diabetes mellitus with other specified complication: Secondary | ICD-10-CM | POA: Insufficient documentation

## 2017-09-09 DIAGNOSIS — E785 Hyperlipidemia, unspecified: Secondary | ICD-10-CM

## 2017-09-09 DIAGNOSIS — M810 Age-related osteoporosis without current pathological fracture: Secondary | ICD-10-CM | POA: Insufficient documentation

## 2017-09-16 ENCOUNTER — Other Ambulatory Visit: Payer: Self-pay | Admitting: Family Medicine

## 2017-09-16 DIAGNOSIS — E785 Hyperlipidemia, unspecified: Secondary | ICD-10-CM

## 2017-09-16 DIAGNOSIS — F419 Anxiety disorder, unspecified: Secondary | ICD-10-CM

## 2017-09-16 DIAGNOSIS — Z8659 Personal history of other mental and behavioral disorders: Secondary | ICD-10-CM

## 2017-10-07 LAB — HM DIABETES EYE EXAM

## 2017-10-10 ENCOUNTER — Ambulatory Visit (INDEPENDENT_AMBULATORY_CARE_PROVIDER_SITE_OTHER): Payer: Medicare Other

## 2017-10-10 VITALS — BP 142/60 | HR 78 | Temp 97.7°F | Resp 16 | Ht 63.0 in | Wt 182.6 lb

## 2017-10-10 DIAGNOSIS — Z1159 Encounter for screening for other viral diseases: Secondary | ICD-10-CM

## 2017-10-10 DIAGNOSIS — Z0000 Encounter for general adult medical examination without abnormal findings: Secondary | ICD-10-CM | POA: Diagnosis not present

## 2017-10-10 NOTE — Patient Instructions (Signed)
Ms. Bianca Hawkins , Thank you for taking time to come for your Medicare Wellness Visit. I appreciate your ongoing commitment to your health goals. Please review the following plan we discussed and let me know if I can assist you in the future.   Screening recommendations/referrals: Colonoscopy: Completed Fecal Occult Blood Test on 07/22/15. Repeat colon cancer screenings in 3 years. Mammogram: Completed 12/21/16. Repeat mammogram in one year Bone Density: Completed 10/31/16. Osteoporotic screening no longer required Recommended yearly ophthalmology/optometry visit for glaucoma screening and checkup Recommended yearly dental visit for hygiene and checkup  Vaccinations: Influenza vaccine: Contraindicated due to allergy Pneumococcal vaccine: Declined due to previous allergic reaction. Tdap vaccine: Completed 09/03/13 Shingles vaccine: Completed 11/13/11    Advanced directives: Please bring a copy of your POA (Power of Attorney) and/or Living Will to your next appointment.   Conditions/risks identified: Fall risk prevention discussed; Recommend to exercise at least 150 minutes per week  Next appointment: Please schedule a follow up appointment with Dr. Ronnald Ramp.   Please schedule your annual wellness exam with your Nurse Health Advisor in one year.  Preventive Care 72 Years and Older, Female Preventive care refers to lifestyle choices and visits with your health care provider that can promote health and wellness. What does preventive care include?  A yearly physical exam. This is also called an annual well check.  Dental exams once or twice a year.  Routine eye exams. Ask your health care provider how often you should have your eyes checked.  Personal lifestyle choices, including:  Daily care of your teeth and gums.  Regular physical activity.  Eating a healthy diet.  Avoiding tobacco and drug use.  Limiting alcohol use.  Practicing safe sex.  Taking low-dose aspirin every day.  Taking  vitamin and mineral supplements as recommended by your health care provider. What happens during an annual well check? The services and screenings done by your health care provider during your annual well check will depend on your age, overall health, lifestyle risk factors, and family history of disease. Counseling  Your health care provider may ask you questions about your:  Alcohol use.  Tobacco use.  Drug use.  Emotional well-being.  Home and relationship well-being.  Sexual activity.  Eating habits.  History of falls.  Memory and ability to understand (cognition).  Work and work Statistician.  Reproductive health. Screening  You may have the following tests or measurements:  Height, weight, and BMI.  Blood pressure.  Lipid and cholesterol levels. These may be checked every 5 years, or more frequently if you are over 57 years old.  Skin check.  Lung cancer screening. You may have this screening every year starting at age 4 if you have a 30-pack-year history of smoking and currently smoke or have quit within the past 15 years.  Fecal occult blood test (FOBT) of the stool. You may have this test every year starting at age 40.  Flexible sigmoidoscopy or colonoscopy. You may have a sigmoidoscopy every 5 years or a colonoscopy every 10 years starting at age 67.  Hepatitis C blood test.  Hepatitis B blood test.  Sexually transmitted disease (STD) testing.  Diabetes screening. This is done by checking your blood sugar (glucose) after you have not eaten for a while (fasting). You may have this done every 1-3 years.  Bone density scan. This is done to screen for osteoporosis. You may have this done starting at age 48.  Mammogram. This may be done every 1-2 years. Talk to  your health care provider about how often you should have regular mammograms. Talk with your health care provider about your test results, treatment options, and if necessary, the need for more  tests. Vaccines  Your health care provider may recommend certain vaccines, such as:  Influenza vaccine. This is recommended every year.  Tetanus, diphtheria, and acellular pertussis (Tdap, Td) vaccine. You may need a Td booster every 10 years.  Zoster vaccine. You may need this after age 57.  Pneumococcal 13-valent conjugate (PCV13) vaccine. One dose is recommended after age 39.  Pneumococcal polysaccharide (PPSV23) vaccine. One dose is recommended after age 19. Talk to your health care provider about which screenings and vaccines you need and how often you need them. This information is not intended to replace advice given to you by your health care provider. Make sure you discuss any questions you have with your health care provider. Document Released: 11/25/2015 Document Revised: 07/18/2016 Document Reviewed: 08/30/2015 Elsevier Interactive Patient Education  2017 [place] Prevention in the Home Falls can cause injuries. They can happen to people of all ages. There are many things you can do to make your home safe and to help prevent falls. What can I do on the outside of my home?  Regularly fix the edges of walkways and driveways and fix any cracks.  Remove anything that might make you trip as you walk through a door, such as a raised step or threshold.  Trim any bushes or trees on the path to your home.  Use bright outdoor lighting.  Clear any walking paths of anything that might make someone trip, such as rocks or tools.  Regularly check to see if handrails are loose or broken. Make sure that both sides of any steps have handrails.  Any raised decks and porches should have guardrails on the edges.  Have any leaves, snow, or ice cleared regularly.  Use sand or salt on walking paths during winter.  Clean up any spills in your garage right away. This includes oil or grease spills. What can I do in the bathroom?  Use night lights.  Install grab bars by the  toilet and in the tub and shower. Do not use towel bars as grab bars.  Use non-skid mats or decals in the tub or shower.  If you need to sit down in the shower, use a plastic, non-slip stool.  Keep the floor dry. Clean up any water that spills on the floor as soon as it happens.  Remove soap buildup in the tub or shower regularly.  Attach bath mats securely with double-sided non-slip rug tape.  Do not have throw rugs and other things on the floor that can make you trip. What can I do in the bedroom?  Use night lights.  Make sure that you have a light by your bed that is easy to reach.  Do not use any sheets or blankets that are too big for your bed. They should not hang down onto the floor.  Have a firm chair that has side arms. You can use this for support while you get dressed.  Do not have throw rugs and other things on the floor that can make you trip. What can I do in the kitchen?  Clean up any spills right away.  Avoid walking on wet floors.  Keep items that you use a lot in easy-to-reach places.  If you need to reach something above you, use a strong step stool that has  a grab bar.  Keep electrical cords out of the way.  Do not use floor polish or wax that makes floors slippery. If you must use wax, use non-skid floor wax.  Do not have throw rugs and other things on the floor that can make you trip. What can I do with my stairs?  Do not leave any items on the stairs.  Make sure that there are handrails on both sides of the stairs and use them. Fix handrails that are broken or loose. Make sure that handrails are as long as the stairways.  Check any carpeting to make sure that it is firmly attached to the stairs. Fix any carpet that is loose or worn.  Avoid having throw rugs at the top or bottom of the stairs. If you do have throw rugs, attach them to the floor with carpet tape.  Make sure that you have a light switch at the top of the stairs and the bottom of  the stairs. If you do not have them, ask someone to add them for you. What else can I do to help prevent falls?  Wear shoes that:  Do not have high heels.  Have rubber bottoms.  Are comfortable and fit you well.  Are closed at the toe. Do not wear sandals.  If you use a stepladder:  Make sure that it is fully opened. Do not climb a closed stepladder.  Make sure that both sides of the stepladder are locked into place.  Ask someone to hold it for you, if possible.  Clearly mark and make sure that you can see:  Any grab bars or handrails.  First and last steps.  Where the edge of each step is.  Use tools that help you move around (mobility aids) if they are needed. These include:  Canes.  Walkers.  Scooters.  Crutches.  Turn on the lights when you go into a dark area. Replace any light bulbs as soon as they burn out.  Set up your furniture so you have a clear path. Avoid moving your furniture around.  If any of your floors are uneven, fix them.  If there are any pets around you, be aware of where they are.  Review your medicines with your doctor. Some medicines can make you feel dizzy. This can increase your chance of falling. Ask your doctor what other things that you can do to help prevent falls. This information is not intended to replace advice given to you by your health care provider. Make sure you discuss any questions you have with your health care provider. Document Released: 08/25/2009 Document Revised: 04/05/2016 Document Reviewed: 12/03/2014 Elsevier Interactive Patient Education  2017 [employer].

## 2017-10-10 NOTE — Progress Notes (Signed)
Subjective:   Bianca Hawkins is a 71 y.o. female who presents for Medicare Annual (Subsequent) preventive examination.  Review of Systems:  N/A Cardiac Risk Factors include: hypertension;diabetes mellitus;advanced age (>31men, >80 women);obesity (BMI >30kg/m2);sedentary lifestyle     Objective:     Vitals: BP (!) 142/60 (BP Location: Right Arm, Patient Position: Sitting, Cuff Size: Normal)   Pulse 78   Temp 97.7 F (36.5 C) (Oral)   Resp 16   Ht 5\' 3"  (1.6 m)   Wt 182 lb 9.6 oz (82.8 kg)   BMI 32.35 kg/m   Body mass index is 32.35 kg/m.   Tobacco Social History   Tobacco Use  Smoking Status Former Smoker  . Packs/day: 0.50  . Years: 30.00  . Pack years: 15.00  . Types: Cigarettes  . Last attempt to quit: 2003  . Years since quitting: 15.9  Smokeless Tobacco Never Used     Counseling given: No   Past Medical History:  Diagnosis Date  . Anxiety   . Depression    reccurent depression to due situational disturbance  . Diabetes mellitus without complication ([place])   . GERD (gastroesophageal reflux disease)   . Hyperlipidemia   . Hypertension   . Osteoporosis    Past Surgical History:  Procedure Laterality Date  . BIOPSY BREAST     benign  . BREAST BIOPSY Right 2014   core with clips  . COLONOSCOPY  2008   cleared for 10 years/ occults- 07/22/2015- negative  . INNER EAR SURGERY     benign tumor behind eardrum  . PARATHYROID EXPLORATION     nodule removed  . THYROID EXPLORATION     nodule removed   Family History  Problem Relation Age of Onset  . Ulcerative colitis Mother   . Heart attack Father   . Heart disease Sister   . Birth defects Sister   . Healthy Brother   . Stroke Daughter   . Healthy Son   . Breast cancer Neg Hx    Social History   Substance and Sexual Activity  Sexual Activity Not Currently    Outpatient Encounter Medications as of 10/10/2017  Medication Sig  . ACCU-CHEK AVIVA PLUS test strip USE AS DIRECTED  .  aspirin 81 MG tablet Take 1 tablet by mouth daily.  . Calcium Citrate 200 MG TABS Take 500 mg by mouth daily.  . cetirizine (ZYRTEC) 5 MG tablet Take 1 tablet by mouth as needed.  . Cholecalciferol (VITAMIN D-1000 MAX ST) 1000 units tablet Take 1,000 mg by mouth daily.  . fluticasone (FLONASE) 50 MCG/ACT nasal spray Place 1 spray into both nostrils as needed.  Marland Kitchen glipiZIDE (GLUCOTROL XL) 2.5 MG 24 hr tablet Take 2.5 mg by mouth daily with breakfast. Dr Maretta Bees  . metFORMIN (GLUCOPHAGE) 1000 MG tablet Take 1 tablet (1,000 mg total) by mouth 2 (two) times daily. (Patient taking differently: Take 1,000 mg by mouth 2 (two) times daily. Dr Maretta Bees)  . metoprolol tartrate (LOPRESSOR) 50 MG tablet TAKE ONE TABLET BY MOUTH TWICE DAILY  . omeprazole (PRILOSEC) 20 MG capsule TAKE 1 CAPSULE BY MOUTH EVERY DAY  . ONGLYZA 5 MG TABS tablet TAKE ONE TABLET BY MOUTH EVERY DAY  . PARoxetine (PAXIL) 10 MG tablet Take 1 tablet (10 mg total) by mouth daily.  . ramipril (ALTACE) 5 MG capsule TAKE 1 CAPSULE BY MOUTH EVERY DAY  . simvastatin (ZOCOR) 20 MG tablet Take 1 tablet (20 mg total) by mouth daily.  . metFORMIN (  GLUCOPHAGE) 1000 MG tablet TAKE ONE TABLET BY MOUTH TWICE DAILY  . PARoxetine (PAXIL) 10 MG tablet TAKE 1 TABLET BY MOUTH DAILY  . ramipril (ALTACE) 5 MG capsule TAKE 1 CAPSULE BY MOUTH DAILY  . simvastatin (ZOCOR) 20 MG tablet TAKE 1 TABLET BY MOUTH DAILY   No facility-administered encounter medications on file as of 10/10/2017.     Activities of Daily Living In your present state of health, do you have any difficulty performing the following activities: 10/10/2017  Hearing? N  Vision? N  Difficulty concentrating or making decisions? N  Walking or climbing stairs? N  Dressing or bathing? N  Doing errands, shopping? N  Preparing Food and eating ? N  Using the Toilet? N  In the past six months, have you accidently leaked urine? N  Do you have problems with loss of bowel control? N  Managing your  Medications? N  Managing your Finances? N  Housekeeping or managing your Housekeeping? N  Some recent data might be hidden    Patient Care Team: Juline Patch, MD as PCP - General (Family Medicine) Lonia Farber, MD as Consulting Physician (Internal Medicine)    Assessment:     Exercise Activities and Dietary recommendations Current Exercise Habits: The patient does not participate in regular exercise at present, Exercise limited by: None identified  Goals    . Exercise 150 min/wk Moderate Activity     Recommend to exercise at least 150 minutes per week      Fall Risk Fall Risk  10/10/2017 05/28/2017 03/22/2016 09/05/2015 08/09/2015  Falls in the past year? No No No No No  Risk for fall due to : - History of fall(s) - - -   Depression Screen PHQ 2/9 Scores 10/10/2017 05/28/2017 03/22/2016 09/05/2015  PHQ - 2 Score 0 0 0 0     Cognitive Function     6CIT Screen 10/10/2017  What Year? 0 points  What month? 0 points  What time? 0 points  Count back from 20 0 points  Months in reverse 0 points  Repeat phrase 0 points  Total Score 0    Immunization History  Administered Date(s) Administered  . Pneumococcal Polysaccharide-23 09/03/2013  . Tdap 09/03/2013  . Zoster 11/13/2011   Screening Tests Health Maintenance  Topic Date Due  . Hepatitis C Screening  07/21/1946  . HEMOGLOBIN A1C  12/29/2016  . OPHTHALMOLOGY EXAM  09/24/2017  . FOOT EXAM  10/31/2017  . MAMMOGRAM  12/20/2017  . Fecal DNA (Cologuard)  07/21/2018  . TETANUS/TDAP  09/04/2023  . DEXA SCAN  Completed  . PNA vac Low Risk Adult  Completed  . INFLUENZA VACCINE  Discontinued      Plan:    I have personally reviewed and addressed the Medicare Annual Wellness questionnaire and have noted the following in the patient's chart:  A. Medical and social history B. Use of alcohol, tobacco or illicit drugs  C. Current medications and supplements D. Functional ability and status E.  Nutritional  status F.  Physical activity G. Advance directives H. List of other physicians I.  Hospitalizations, surgeries, and ER visits in previous 12 months J.  [place] such as hearing and vision if needed, cognitive and depression L. Referrals and appointments - none  In addition, I have reviewed and discussed with patient certain preventive protocols, quality metrics, and best practice recommendations. A written personalized care plan for preventive services as well as general preventive health recommendations were provided to patient.  See attached scanned questionnaire for additional information.   Signed,  Aleatha Borer, LPN Nurse Health Advisor  MD Recommendations: Will require FOBT on or after 07/21/18. Repeat Mammogram 12/21/17. Hep C screening ordered today. Pt given lab req for completion.

## 2017-12-11 LAB — BASIC METABOLIC PANEL
BUN: 18 (ref 4–21)
CREATININE: 0.7 (ref <=1.1)
Glucose: 146
Potassium: 6 — AB (ref 3.4–5.3)
SODIUM: 138 (ref 137–147)

## 2017-12-11 LAB — TSH: TSH: 0.55 (ref <=5.90)

## 2017-12-11 LAB — HEMOGLOBIN A1C: HEMOGLOBIN A1C: 6.3

## 2017-12-17 ENCOUNTER — Ambulatory Visit (INDEPENDENT_AMBULATORY_CARE_PROVIDER_SITE_OTHER): Payer: Medicare Other | Admitting: Family Medicine

## 2017-12-17 ENCOUNTER — Encounter: Payer: Self-pay | Admitting: Family Medicine

## 2017-12-17 VITALS — BP 160/100 | HR 72 | Ht 63.0 in | Wt 182.0 lb

## 2017-12-17 DIAGNOSIS — E7800 Pure hypercholesterolemia, unspecified: Secondary | ICD-10-CM | POA: Diagnosis not present

## 2017-12-17 DIAGNOSIS — Z1239 Encounter for other screening for malignant neoplasm of breast: Secondary | ICD-10-CM

## 2017-12-17 DIAGNOSIS — I10 Essential (primary) hypertension: Secondary | ICD-10-CM | POA: Diagnosis not present

## 2017-12-17 DIAGNOSIS — Z1231 Encounter for screening mammogram for malignant neoplasm of breast: Secondary | ICD-10-CM | POA: Diagnosis not present

## 2017-12-17 DIAGNOSIS — K219 Gastro-esophageal reflux disease without esophagitis: Secondary | ICD-10-CM | POA: Diagnosis not present

## 2017-12-17 DIAGNOSIS — F419 Anxiety disorder, unspecified: Secondary | ICD-10-CM | POA: Diagnosis not present

## 2017-12-17 DIAGNOSIS — Z8659 Personal history of other mental and behavioral disorders: Secondary | ICD-10-CM | POA: Diagnosis not present

## 2017-12-17 MED ORDER — SIMVASTATIN 20 MG PO TABS: 20.0000 mg | ORAL_TABLET | Freq: Every day | ORAL | 1 refills | Status: DC

## 2017-12-17 MED ORDER — OMEPRAZOLE 20 MG PO CPDR: DELAYED_RELEASE_CAPSULE | ORAL | 1 refills | Status: DC

## 2017-12-17 MED ORDER — PAROXETINE HCL 10 MG PO TABS: 10.0000 mg | ORAL_TABLET | Freq: Every day | ORAL | 1 refills | Status: DC

## 2017-12-17 MED ORDER — RAMIPRIL 5 MG PO CAPS: ORAL_CAPSULE | ORAL | 1 refills | Status: DC

## 2017-12-17 NOTE — Addendum Note (Signed)
Addended by: Fredderick Severance on: 12/17/2017 03:18 PM   Modules accepted: Orders

## 2017-12-17 NOTE — Progress Notes (Signed)
Name: Bianca Hawkins   MRN: [id number]    DOB: [birth year]   Date:12/17/2017       Progress Note  Subjective  Chief Complaint  Chief Complaint  Patient presents with  . Hyperlipidemia  . Hypertension  . Anxiety  . Gastroesophageal Reflux    Hyperlipidemia  This is a chronic problem. The current episode started more than 1 year ago. The problem is controlled. Recent lipid tests were reviewed and are normal. She has no history of chronic renal disease, diabetes, hypothyroidism, liver disease, obesity or nephrotic syndrome. There are no known factors aggravating her hyperlipidemia. Pertinent negatives include no chest pain, focal sensory loss, focal weakness, leg pain, myalgias or shortness of breath. Current antihyperlipidemic treatment includes statins. The current treatment provides moderate improvement of lipids. There are no compliance problems.  Risk factors for coronary artery disease include post-menopausal, hypertension and dyslipidemia.  Hypertension  This is a chronic problem. The current episode started more than 1 year ago. The problem has been waxing and waning since onset. The problem is controlled. Associated symptoms include anxiety. Pertinent negatives include no blurred vision, chest pain, headaches, malaise/fatigue, neck pain, orthopnea, palpitations, peripheral edema, PND, shortness of breath or sweats. There are no associated agents to hypertension. Risk factors for coronary artery disease include dyslipidemia and post-menopausal state. Past treatments include ACE inhibitors and beta blockers. The current treatment provides moderate improvement. There are no compliance problems.  There is no history of angina, kidney disease, CAD/MI, CVA, heart failure, left ventricular hypertrophy, PVD or retinopathy. There is no history of chronic renal disease, a hypertension causing med or renovascular disease.  Anxiety  Presents for follow-up visit. Symptoms include excessive worry and  nervous/anxious behavior. Patient reports no chest pain, compulsions, confusion, decreased concentration, depressed mood, dizziness, dry mouth, feeling of choking, hyperventilation, impotence, insomnia, irritability, malaise, muscle tension, nausea, obsessions, palpitations, panic, restlessness, shortness of breath or suicidal ideas. Symptoms occur occasionally. The severity of symptoms is moderate. The quality of sleep is non-restorative.    Gastroesophageal Reflux  She complains of abdominal pain, belching, choking, coughing, dysphagia, heartburn, a hoarse voice, a sore throat, stridor and wheezing. She reports no chest pain or no nausea. This is a chronic problem. The current episode started more than 1 year ago. The problem occurs frequently. The problem has been waxing and waning. The heartburn is of moderate intensity. Nothing aggravates the symptoms. Pertinent negatives include no melena or weight loss.    No problem-specific Assessment & Plan notes found for this encounter.   Past Medical History:  Diagnosis Date  . Anxiety   . Depression    reccurent depression to due situational disturbance  . Diabetes mellitus without complication ([place])   . GERD (gastroesophageal reflux disease)   . Hyperlipidemia   . Hypertension   . Osteoporosis     Past Surgical History:  Procedure Laterality Date  . BIOPSY BREAST     benign  . BREAST BIOPSY Right 2014   core with clips  . COLONOSCOPY  2008   cleared for 10 years/ occults- 07/22/2015- negative  . INNER EAR SURGERY     benign tumor behind eardrum  . PARATHYROID EXPLORATION     nodule removed  . THYROID EXPLORATION     nodule removed    Family History  Problem Relation Age of Onset  . Ulcerative colitis Mother   . Heart attack Father   . Heart disease Sister   . Birth defects Sister   . Healthy Brother   .  Stroke Daughter   . Healthy Son   . Breast cancer Neg Hx     Social History   Socioeconomic History  . Marital  status: Married    Spouse name: Not on file  . Number of children: 2  . Years of education: Not on file  . Highest education level: Not on file  Social Needs  . Financial resource strain: Not hard at all  . Food insecurity - worry: Never true  . Food insecurity - inability: Never true  . Transportation needs - medical: No  . Transportation needs - non-medical: No  Occupational History  . Occupation: Retired  Tobacco Use  . Smoking status: Former Smoker    Packs/day: 0.50    Years: 30.00    Pack years: 15.00    Types: Cigarettes    Last attempt to quit: 2003    Years since quitting: 16.1  . Smokeless tobacco: Never Used  Substance and Sexual Activity  . Alcohol use: No    Alcohol/week: 0.0 oz  . Drug use: No  . Sexual activity: Not Currently  Other Topics Concern  . Not on file  Social History Narrative  . Not on file    Allergies  Allergen Reactions  . Eggs Or Egg-Derived Products   . Other     Pepper  . Sulfa Antibiotics     Outpatient Medications Prior to Visit  Medication Sig Dispense Refill  . ACCU-CHEK AVIVA PLUS test strip USE AS DIRECTED 100 each 0  . aspirin 81 MG tablet Take 1 tablet by mouth daily.    . Calcium Citrate 200 MG TABS Take 500 mg by mouth daily.    . cetirizine (ZYRTEC) 5 MG tablet Take 1 tablet by mouth as needed.    . Cholecalciferol (VITAMIN D-1000 MAX ST) 1000 units tablet Take 1,000 mg by mouth daily.    . fluticasone (FLONASE) 50 MCG/ACT nasal spray Place 1 spray into both nostrils as needed.    Marland Kitchen glipiZIDE (GLUCOTROL XL) 2.5 MG 24 hr tablet Take 5 mg by mouth daily with breakfast. Dr Honor Junes    . metFORMIN (GLUCOPHAGE) 1000 MG tablet Take 1 tablet (1,000 mg total) by mouth 2 (two) times daily. (Patient taking differently: Take 1,000 mg by mouth 2 (two) times daily. Dr Honor Junes) 180 tablet 1  . metoprolol tartrate (LOPRESSOR) 50 MG tablet TAKE ONE TABLET BY MOUTH TWICE DAILY 180 tablet 3  . ONGLYZA 5 MG TABS tablet TAKE ONE TABLET BY  MOUTH EVERY DAY 90 tablet 1  . omeprazole (PRILOSEC) 20 MG capsule TAKE 1 CAPSULE BY MOUTH EVERY DAY 90 capsule 1  . PARoxetine (PAXIL) 10 MG tablet Take 1 tablet (10 mg total) by mouth daily. 90 tablet 1  . ramipril (ALTACE) 5 MG capsule TAKE 1 CAPSULE BY MOUTH EVERY DAY 90 capsule 1  . simvastatin (ZOCOR) 20 MG tablet Take 1 tablet (20 mg total) by mouth daily. 90 tablet 1  . metFORMIN (GLUCOPHAGE) 1000 MG tablet TAKE ONE TABLET BY MOUTH TWICE DAILY 180 tablet 1  . PARoxetine (PAXIL) 10 MG tablet TAKE 1 TABLET BY MOUTH DAILY 90 tablet 0  . ramipril (ALTACE) 5 MG capsule TAKE 1 CAPSULE BY MOUTH DAILY 90 capsule 1  . simvastatin (ZOCOR) 20 MG tablet TAKE 1 TABLET BY MOUTH DAILY 90 tablet 0   No facility-administered medications prior to visit.     Review of Systems  Constitutional: Negative for chills, fever, irritability, malaise/fatigue and weight loss.  HENT: Positive for  hoarse voice and sore throat. Negative for ear discharge and ear pain.   Eyes: Negative for blurred vision.  Respiratory: Positive for cough, choking and wheezing. Negative for sputum production and shortness of breath.   Cardiovascular: Negative for chest pain, palpitations, orthopnea, leg swelling and PND.  Gastrointestinal: Positive for abdominal pain, dysphagia and heartburn. Negative for blood in stool, constipation, diarrhea, melena and nausea.  Genitourinary: Negative for dysuria, frequency, hematuria, impotence and urgency.  Musculoskeletal: Negative for back pain, joint pain, myalgias and neck pain.  Skin: Negative for rash.  Neurological: Negative for dizziness, tingling, sensory change, focal weakness and headaches.  Endo/Heme/Allergies: Negative for environmental allergies and polydipsia. Does not bruise/bleed easily.  Psychiatric/Behavioral: Negative for confusion, decreased concentration, depression and suicidal ideas. The patient is nervous/anxious. The patient does not have insomnia.       Objective  Vitals:   12/17/17 0959  BP: (!) 160/100  Pulse: 72  Weight: 182 lb (82.6 kg)  Height: 5\' 3"  (1.6 m)    Physical Exam  Constitutional: She is well-developed, well-nourished, and in no distress. No distress.  HENT:  Head: Normocephalic and atraumatic.  Right Ear: External ear normal.  Left Ear: External ear normal.  Nose: Nose normal.  Mouth/Throat: Oropharynx is clear and moist.  Eyes: Conjunctivae and EOM are normal. Pupils are equal, round, and reactive to light. Right eye exhibits no discharge. Left eye exhibits no discharge.  Neck: Normal range of motion. Neck supple. No JVD present. No thyromegaly present.  Cardiovascular: Normal rate, regular rhythm, normal heart sounds and intact distal pulses. Exam reveals no gallop and no friction rub.  No murmur heard. Pulmonary/Chest: Effort normal and breath sounds normal. She has no wheezes. She has no rales.  Abdominal: Soft. Bowel sounds are normal. She exhibits no mass. There is no tenderness. There is no guarding.  Musculoskeletal: Normal range of motion. She exhibits no edema.  Lymphadenopathy:    She has no cervical adenopathy.  Neurological: She is alert. She has normal reflexes.  Skin: Skin is warm and dry. She is not diaphoretic.  Psychiatric: Mood and affect normal.  Nursing note and vitals reviewed.     Assessment & Plan  Problem List Items Addressed This Visit      Cardiovascular and Mediastinum   Essential hypertension   Relevant Medications   ramipril (ALTACE) 5 MG capsule   simvastatin (ZOCOR) 20 MG tablet     Digestive   Gastroesophageal reflux disease without esophagitis   Relevant Medications   omeprazole (PRILOSEC) 20 MG capsule     Other   H/O: depression   Relevant Medications   PARoxetine (PAXIL) 10 MG tablet   Chronic anxiety   Relevant Medications   PARoxetine (PAXIL) 10 MG tablet    Other Visit Diagnoses    Pure hypercholesterolemia       Relevant Medications    ramipril (ALTACE) 5 MG capsule   simvastatin (ZOCOR) 20 MG tablet      Meds ordered this encounter  Medications  . PARoxetine (PAXIL) 10 MG tablet    Sig: Take 1 tablet (10 mg total) by mouth daily.    Dispense:  90 tablet    Refill:  1    FOR FUTURE REFILLS  . ramipril (ALTACE) 5 MG capsule    Sig: TAKE 1 CAPSULE BY MOUTH EVERY DAY    Dispense:  90 capsule    Refill:  1    FOR FUTURE FILL THANKS  . simvastatin (ZOCOR) 20 MG tablet  Sig: Take 1 tablet (20 mg total) by mouth daily.    Dispense:  90 tablet    Refill:  1  . omeprazole (PRILOSEC) 20 MG capsule    Sig: TAKE 1 CAPSULE BY MOUTH EVERY DAY    Dispense:  90 capsule    Refill:  1    FOR FUTURE FILL THANKS      Dr. [facility]  12/17/17

## 2018-01-01 ENCOUNTER — Ambulatory Visit
Admission: RE | Admit: 2018-01-01 | Discharge: 2018-01-01 | Disposition: A | Discharge status: Home / Self Care | Payer: Medicare Other | Source: Ambulatory Visit | Attending: Family Medicine | Admitting: Family Medicine

## 2018-01-01 DIAGNOSIS — Z1231 Encounter for screening mammogram for malignant neoplasm of breast: Secondary | ICD-10-CM | POA: Insufficient documentation

## 2018-01-01 DIAGNOSIS — Z1239 Encounter for other screening for malignant neoplasm of breast: Secondary | ICD-10-CM

## 2018-01-22 ENCOUNTER — Encounter: Payer: Self-pay | Admitting: Family Medicine

## 2018-01-22 ENCOUNTER — Ambulatory Visit: Payer: Medicare Other | Admitting: Family Medicine

## 2018-01-22 VITALS — BP 116/68 | HR 78 | Resp 16 | Ht 63.0 in | Wt 175.4 lb

## 2018-01-22 DIAGNOSIS — N3000 Acute cystitis without hematuria: Secondary | ICD-10-CM | POA: Diagnosis not present

## 2018-01-22 DIAGNOSIS — F339 Major depressive disorder, recurrent, unspecified: Secondary | ICD-10-CM | POA: Insufficient documentation

## 2018-01-22 DIAGNOSIS — Z9889 Other specified postprocedural states: Secondary | ICD-10-CM | POA: Insufficient documentation

## 2018-01-22 LAB — POCT URINALYSIS DIPSTICK
Bilirubin, UA: NEGATIVE
Glucose, UA: NEGATIVE
Ketones, UA: NEGATIVE
NITRITE UA: POSITIVE
SPEC GRAV UA: 1.01 (ref 1.010–1.025)
UROBILINOGEN UA: 0.2 U/dL
pH, UA: 6 (ref 5.0–8.0)

## 2018-01-22 MED ORDER — FLUCONAZOLE 150 MG PO TABS: 150.0000 mg | ORAL_TABLET | Freq: Once | ORAL | 0 refills | Status: AC

## 2018-01-22 MED ORDER — CIPROFLOXACIN HCL 250 MG PO TABS: 250.0000 mg | ORAL_TABLET | Freq: Two times a day (BID) | ORAL | 0 refills | Status: DC

## 2018-01-22 NOTE — Progress Notes (Signed)
Name: Bianca Hawkins   MRN: [id number]    DOB: 1946-05-14   Date:01/22/2018       Progress Note  Subjective  Chief Complaint  Chief Complaint  Patient presents with  . Urinary Frequency    denies odor or dysuria but feels like she has to go as soon as she is done urinating and can only do another drop.    Urinary Frequency   This is a new problem. The current episode started in the past 7 days. The problem occurs intermittently. The problem has been unchanged. The quality of the pain is described as burning. The pain is at a severity of 3/10. The pain is moderate. There has been no fever. Associated symptoms include frequency and urgency. Pertinent negatives include no chills, discharge, flank pain, hematuria, hesitancy, nausea, sweats or vomiting. Associated symptoms comments: Suprapubic pressure. She has tried nothing for the symptoms. The treatment provided mild relief.    No problem-specific Assessment & Plan notes found for this encounter.   Past Medical History:  Diagnosis Date  . Anxiety   . Depression    reccurent depression to due situational disturbance  . Diabetes mellitus without complication ([place])   . GERD (gastroesophageal reflux disease)   . Hyperlipidemia   . Hypertension   . Osteoporosis     Past Surgical History:  Procedure Laterality Date  . BIOPSY BREAST     benign  . BREAST BIOPSY Right 2014   core with clips;benign per pt  . COLONOSCOPY  2008   cleared for 10 years/ occults- 07/22/2015- negative  . INNER EAR SURGERY     benign tumor behind eardrum  . PARATHYROID EXPLORATION     nodule removed  . THYROID EXPLORATION     nodule removed    Family History  Problem Relation Age of Onset  . Ulcerative colitis Mother   . Heart attack Father   . Heart disease Sister   . Birth defects Sister   . Healthy Brother   . Stroke Daughter   . Healthy Son   . Breast cancer Neg Hx     Social History   Socioeconomic History  . Marital status:  Married    Spouse name: Not on file  . Number of children: 2  . Years of education: Not on file  . Highest education level: Not on file  Social Needs  . Financial resource strain: Not hard at all  . Food insecurity - worry: Never true  . Food insecurity - inability: Never true  . Transportation needs - medical: No  . Transportation needs - non-medical: No  Occupational History  . Occupation: Retired  Tobacco Use  . Smoking status: Former Smoker    Packs/day: 0.50    Years: 30.00    Pack years: 15.00    Types: Cigarettes    Last attempt to quit: 2003    Years since quitting: 16.2  . Smokeless tobacco: Never Used  Substance and Sexual Activity  . Alcohol use: No    Alcohol/week: 0.0 oz  . Drug use: No  . Sexual activity: Not Currently  Other Topics Concern  . Not on file  Social History Narrative  . Not on file    Allergies  Allergen Reactions  . Eggs Or Egg-Derived Products   . Other     Pepper  . Sulfa Antibiotics     Outpatient Medications Prior to Visit  Medication Sig Dispense Refill  . ACCU-CHEK AVIVA PLUS test strip USE AS DIRECTED  100 each 0  . aspirin 81 MG tablet Take 1 tablet by mouth daily.    . Cholecalciferol (VITAMIN D-1000 MAX ST) 1000 units tablet Take 1,000 mg by mouth daily.    Marland Kitchen glipiZIDE (GLUCOTROL XL) 5 MG 24 hr tablet     . metFORMIN (GLUCOPHAGE) 1000 MG tablet Take 1 tablet (1,000 mg total) by mouth 2 (two) times daily. (Patient taking differently: Take 1,000 mg by mouth 2 (two) times daily. Dr Honor Junes) 180 tablet 1  . metoprolol tartrate (LOPRESSOR) 50 MG tablet TAKE ONE TABLET BY MOUTH TWICE DAILY 180 tablet 3  . omeprazole (PRILOSEC) 20 MG capsule TAKE 1 CAPSULE BY MOUTH EVERY DAY 90 capsule 1  . ONGLYZA 5 MG TABS tablet TAKE ONE TABLET BY MOUTH EVERY DAY 90 tablet 1  . PARoxetine (PAXIL) 10 MG tablet Take 1 tablet (10 mg total) by mouth daily. 90 tablet 1  . ramipril (ALTACE) 5 MG capsule TAKE 1 CAPSULE BY MOUTH EVERY DAY 90 capsule 1   . simvastatin (ZOCOR) 20 MG tablet Take 1 tablet (20 mg total) by mouth daily. 90 tablet 1  . calcium carbonate (OS-CAL - DOSED IN MG OF ELEMENTAL CALCIUM) 1250 (500 Ca) MG tablet Take by mouth.    . cetirizine (ZYRTEC) 5 MG tablet Take 1 tablet by mouth as needed.    . fluticasone (FLONASE) 50 MCG/ACT nasal spray Place 1 spray into both nostrils as needed.    . Calcium Citrate 200 MG TABS Take 500 mg by mouth daily.    Marland Kitchen glipiZIDE (GLUCOTROL XL) 2.5 MG 24 hr tablet Take 5 mg by mouth daily with breakfast. Dr Honor Junes     No facility-administered medications prior to visit.     Review of Systems  Constitutional: Negative for chills, fever, malaise/fatigue and weight loss.  HENT: Negative for ear discharge, ear pain and sore throat.   Eyes: Negative for blurred vision.  Respiratory: Negative for cough, sputum production, shortness of breath and wheezing.   Cardiovascular: Negative for chest pain, palpitations and leg swelling.  Gastrointestinal: Negative for abdominal pain, blood in stool, constipation, diarrhea, heartburn, melena, nausea and vomiting.  Genitourinary: Positive for frequency and urgency. Negative for dysuria, flank pain, hematuria and hesitancy.  Musculoskeletal: Negative for back pain, joint pain, myalgias and neck pain.  Skin: Negative for rash.  Neurological: Negative for dizziness, tingling, sensory change, focal weakness and headaches.  Endo/Heme/Allergies: Negative for environmental allergies and polydipsia. Does not bruise/bleed easily.  Psychiatric/Behavioral: Negative for depression and suicidal ideas. The patient is not nervous/anxious and does not have insomnia.      Objective  Vitals:   01/22/18 1441  BP: 116/68  Pulse: 78  Resp: 16  SpO2: 98%  Weight: 175 lb 6.4 oz (79.6 kg)  Height: 5\' 3"  (1.6 m)    Physical Exam  Constitutional: She is well-developed, well-nourished, and in no distress. No distress.  HENT:  Head: Normocephalic and atraumatic.   Right Ear: External ear normal.  Left Ear: External ear normal.  Nose: Nose normal.  Mouth/Throat: Oropharynx is clear and moist.  Eyes: Conjunctivae and EOM are normal. Pupils are equal, round, and reactive to light. Right eye exhibits no discharge. Left eye exhibits no discharge.  Neck: Normal range of motion. Neck supple. No JVD present. No thyromegaly present.  Cardiovascular: Normal rate, regular rhythm, normal heart sounds and intact distal pulses. Exam reveals no gallop and no friction rub.  No murmur heard. Pulmonary/Chest: Effort normal and breath sounds normal. She has  no wheezes. She has no rales.  Abdominal: Soft. Bowel sounds are normal. She exhibits no mass. There is no tenderness. There is no guarding.  Musculoskeletal: Normal range of motion. She exhibits no edema.  Lymphadenopathy:    She has no cervical adenopathy.  Neurological: She is alert. She has normal reflexes.  Skin: Skin is warm and dry. She is not diaphoretic.  Psychiatric: Mood and affect normal.  Nursing note and vitals reviewed.     Assessment & Plan  Problem List Items Addressed This Visit    None    Visit Diagnoses    Acute cystitis without hematuria    -  Primary   Relevant Medications   ciprofloxacin (CIPRO) 250 MG tablet   fluconazole (DIFLUCAN) 150 MG tablet   Other Relevant Orders   POCT urinalysis dipstick (Completed)      Meds ordered this encounter  Medications  . ciprofloxacin (CIPRO) 250 MG tablet    Sig: Take 1 tablet (250 mg total) by mouth 2 (two) times daily.    Dispense:  6 tablet    Refill:  0  . fluconazole (DIFLUCAN) 150 MG tablet    Sig: Take 1 tablet (150 mg total) by mouth once for 1 dose.    Dispense:  1 tablet    Refill:  0      Dr. Otilio Miu [facility]  01/22/18

## 2018-02-15 ENCOUNTER — Other Ambulatory Visit: Payer: Self-pay | Admitting: Family Medicine

## 2018-02-15 DIAGNOSIS — I10 Essential (primary) hypertension: Secondary | ICD-10-CM

## 2018-04-10 ENCOUNTER — Encounter: Payer: Self-pay | Admitting: Family Medicine

## 2018-04-10 ENCOUNTER — Ambulatory Visit: Payer: Medicare Other | Admitting: Family Medicine

## 2018-04-10 VITALS — BP 138/92 | HR 80 | Ht 68.0 in | Wt 176.0 lb

## 2018-04-10 DIAGNOSIS — R1032 Left lower quadrant pain: Secondary | ICD-10-CM | POA: Diagnosis not present

## 2018-04-10 NOTE — Progress Notes (Signed)
Name: Bianca Hawkins   MRN: [id number]    DOB: [birth year]   Date:04/10/2018       Progress Note  Subjective  Chief Complaint  Chief Complaint  Patient presents with  . Abdominal Pain    LLQ pain- dull and achy pain. "can rub acroos it and it goes away"- has a bruise that she doesn't know where it came from in the same area    Abdominal Pain  This is a new problem. The current episode started in the past 7 days (1 month). The onset quality is sudden. The problem occurs daily. The problem has been resolved. The pain is located in the LLQ. The pain is at a severity of 4/10. The pain is mild. Quality: "discomfort" The abdominal pain does not radiate. Pertinent negatives include no anorexia, constipation, diarrhea, dysuria, fever, frequency, headaches, hematochezia, hematuria, melena, myalgias, nausea or weight loss. The pain is relieved by nothing. She has tried nothing for the symptoms. The treatment provided moderate relief. There is no history of abdominal surgery, colon cancer, gallstones, GERD, irritable bowel syndrome, pancreatitis, PUD or ulcerative colitis.    No problem-specific Assessment & Plan notes found for this encounter.   Past Medical History:  Diagnosis Date  . Anxiety   . Depression    reccurent depression to due situational disturbance  . Diabetes mellitus without complication ([place])   . GERD (gastroesophageal reflux disease)   . Hyperlipidemia   . Hypertension   . Osteoporosis     Past Surgical History:  Procedure Laterality Date  . BIOPSY BREAST     benign  . BREAST BIOPSY Right 2014   core with clips;benign per pt  . COLONOSCOPY  2008   cleared for 10 years/ occults- 07/22/2015- negative  . INNER EAR SURGERY     benign tumor behind eardrum  . PARATHYROID EXPLORATION     nodule removed  . THYROID EXPLORATION     nodule removed    Family History  Problem Relation Age of Onset  . Ulcerative colitis Mother   . Heart attack Father   . Heart  disease Sister   . Birth defects Sister   . Healthy Brother   . Stroke Daughter   . Healthy Son   . Breast cancer Neg Hx     Social History   Socioeconomic History  . Marital status: Married    Spouse name: Not on file  . Number of children: 2  . Years of education: Not on file  . Highest education level: Not on file  Occupational History  . Occupation: Retired  Scientific laboratory technician  . Financial resource strain: Not hard at all  . Food insecurity:    Worry: Never true    Inability: Never true  . Transportation needs:    Medical: No    Non-medical: No  Tobacco Use  . Smoking status: Former Smoker    Packs/day: 0.50    Years: 30.00    Pack years: 15.00    Types: Cigarettes    Last attempt to quit: 2003    Years since quitting: 16.4  . Smokeless tobacco: Never Used  Substance and Sexual Activity  . Alcohol use: No    Alcohol/week: 0.0 oz  . Drug use: No  . Sexual activity: Not Currently  Lifestyle  . Physical activity:    Days per week: 0 days    Minutes per session: 0 min  . Stress: Not at all  Relationships  . Social connections:  Talks on phone: More than three times a week    Gets together: Once a week    Attends religious service: More than 4 times per year    Active member of club or organization: Yes    Attends meetings of clubs or organizations: More than 4 times per year    Relationship status: Married  . Intimate partner violence:    Fear of current or ex partner: No    Emotionally abused: No    Physically abused: No    Forced sexual activity: No  Other Topics Concern  . Not on file  Social History Narrative  . Not on file    Allergies  Allergen Reactions  . Eggs Or Egg-Derived Products   . Other     Pepper  . Sulfa Antibiotics     Outpatient Medications Prior to Visit  Medication Sig Dispense Refill  . ACCU-CHEK AVIVA PLUS test strip USE AS DIRECTED 100 each 0  . aspirin 81 MG tablet Take 1 tablet by mouth daily.    . cetirizine (ZYRTEC) 5  MG tablet Take 1 tablet by mouth as needed.    . Cholecalciferol (VITAMIN D-1000 MAX ST) 1000 units tablet Take 1,000 mg by mouth daily.    . fluticasone (FLONASE) 50 MCG/ACT nasal spray Place 1 spray into both nostrils as needed.    Marland Kitchen glipiZIDE (GLUCOTROL XL) 5 MG 24 hr tablet     . metFORMIN (GLUCOPHAGE) 1000 MG tablet Take 1 tablet (1,000 mg total) by mouth 2 (two) times daily. (Patient taking differently: Take 1,000 mg by mouth 2 (two) times daily. Dr Honor Junes) 180 tablet 1  . metoprolol tartrate (LOPRESSOR) 50 MG tablet TAKE 1 TABLET BY MOUTH TWICE DAILY 180 tablet 0  . omeprazole (PRILOSEC) 20 MG capsule TAKE 1 CAPSULE BY MOUTH EVERY DAY 90 capsule 1  . ONGLYZA 5 MG TABS tablet TAKE ONE TABLET BY MOUTH EVERY DAY 90 tablet 1  . PARoxetine (PAXIL) 10 MG tablet Take 1 tablet (10 mg total) by mouth daily. 90 tablet 1  . ramipril (ALTACE) 5 MG capsule TAKE 1 CAPSULE BY MOUTH EVERY DAY 90 capsule 1  . simvastatin (ZOCOR) 20 MG tablet Take 1 tablet (20 mg total) by mouth daily. 90 tablet 1  . ciprofloxacin (CIPRO) 250 MG tablet Take 1 tablet (250 mg total) by mouth 2 (two) times daily. 6 tablet 0   No facility-administered medications prior to visit.     Review of Systems  Constitutional: Negative for chills, fever, malaise/fatigue and weight loss.  HENT: Negative for ear discharge, ear pain and sore throat.   Eyes: Negative for blurred vision.  Respiratory: Negative for cough, sputum production, shortness of breath and wheezing.   Cardiovascular: Negative for chest pain, palpitations and leg swelling.  Gastrointestinal: Positive for abdominal pain. Negative for anorexia, blood in stool, constipation, diarrhea, heartburn, hematochezia, melena and nausea.  Genitourinary: Negative for dysuria, frequency, hematuria and urgency.       No discharge  Musculoskeletal: Negative for back pain, joint pain, myalgias and neck pain.  Skin: Negative for rash.  Neurological: Negative for dizziness,  tingling, sensory change, focal weakness and headaches.  Endo/Heme/Allergies: Negative for environmental allergies and polydipsia. Bruises/bleeds easily.  Psychiatric/Behavioral: Negative for depression and suicidal ideas. The patient is not nervous/anxious and does not have insomnia.      Objective  Vitals:   04/10/18 0819  BP: (!) 138/92  Pulse: 80  Weight: 176 lb (79.8 kg)  Height: 5\' 8"  (1.727  m)    Physical Exam  Constitutional: She is oriented to person, place, and time. She appears well-developed and well-nourished.  HENT:  Head: Normocephalic.  Right Ear: External ear normal.  Left Ear: External ear normal.  Mouth/Throat: Oropharynx is clear and moist.  Eyes: Pupils are equal, round, and reactive to light. Conjunctivae and EOM are normal. Lids are everted and swept, no foreign bodies found. Left eye exhibits no hordeolum. No foreign body present in the left eye. Right conjunctiva is not injected. Left conjunctiva is not injected. No scleral icterus.  Neck: Normal range of motion. Neck supple. No JVD present. No tracheal deviation present. No thyromegaly present.  Cardiovascular: Normal rate, regular rhythm, normal heart sounds and intact distal pulses. Exam reveals no gallop and no friction rub.  No murmur heard. Pulmonary/Chest: Effort normal and breath sounds normal. No respiratory distress. She has no wheezes. She has no rales.  Abdominal: Soft. Normal appearance, normal aorta and bowel sounds are normal. She exhibits no distension, no ascites, no pulsatile midline mass and no mass. There is no hepatosplenomegaly. There is no tenderness. There is no rebound and no guarding.  Genitourinary: Rectum normal, vagina normal and uterus normal. Rectal exam shows no mass, no tenderness and guaiac negative stool. Uterus is not enlarged and not tender. Cervix exhibits no motion tenderness. Right adnexum displays no mass, no tenderness and no fullness. Left adnexum displays no mass, no  tenderness and no fullness.  Musculoskeletal: Normal range of motion. She exhibits no edema or tenderness.  Lymphadenopathy:    She has no cervical adenopathy.  Neurological: She is alert and oriented to person, place, and time. She has normal strength. She displays normal reflexes. No cranial nerve deficit.  Skin: Skin is warm. No rash noted.  Ecchymosis LLQ  Psychiatric: She has a normal mood and affect. Her mood appears not anxious. She does not exhibit a depressed mood.  Nursing note and vitals reviewed.     Assessment & Plan  Problem List Items Addressed This Visit    None    Visit Diagnoses    Left lower quadrant pain    -  Primary   Spontaneous resolution of pain with unremarkable exam.Will locate ? cologuard. will return for gyn exam/pap per pt request. Diff dx diverticulitis, muscular,gyn   Relevant Orders   CBC with Differential/Platelet      No orders of the defined types were placed in this encounter.     Dr. [facility]  04/10/18

## 2018-04-11 LAB — CBC WITH DIFFERENTIAL/PLATELET
BASOS: 1 %
Basophils Absolute: 0.1 10*3/uL (ref 0.0–0.2)
EOS (ABSOLUTE): 0.2 10*3/uL (ref 0.0–0.4)
Eos: 2 %
Hematocrit: 38.3 % (ref 34.0–46.6)
Hemoglobin: 12.6 g/dL (ref 11.1–15.9)
IMMATURE GRANULOCYTES: 0 %
Immature Grans (Abs): 0 10*3/uL (ref 0.0–0.1)
Lymphocytes Absolute: 2 10*3/uL (ref 0.7–3.1)
Lymphs: 23 %
MCH: 27.4 pg (ref 26.6–33.0)
MCHC: 32.9 g/dL (ref 31.5–35.7)
MCV: 83 fL (ref 79–97)
MONOS ABS: 0.7 10*3/uL (ref 0.1–0.9)
Monocytes: 8 %
NEUTROS PCT: 66 %
Neutrophils Absolute: 5.8 10*3/uL (ref 1.4–7.0)
Platelets: 133 10*3/uL — ABNORMAL LOW (ref 150–450)
RBC: 4.6 x10E6/uL (ref 3.77–5.28)
RDW: 15.6 % — AB (ref 12.3–15.4)
WBC: 8.8 10*3/uL (ref 3.4–10.8)

## 2018-05-19 ENCOUNTER — Other Ambulatory Visit: Payer: Self-pay | Admitting: Family Medicine

## 2018-05-19 DIAGNOSIS — I10 Essential (primary) hypertension: Secondary | ICD-10-CM

## 2018-05-26 ENCOUNTER — Other Ambulatory Visit (HOSPITAL_COMMUNITY)
Admission: RE | Admit: 2018-05-26 | Discharge: 2018-05-26 | Disposition: A | Discharge status: Home / Self Care | Payer: Medicare Other | Source: Ambulatory Visit | Attending: Family Medicine | Admitting: Family Medicine

## 2018-05-26 ENCOUNTER — Ambulatory Visit (INDEPENDENT_AMBULATORY_CARE_PROVIDER_SITE_OTHER): Payer: Medicare Other | Admitting: Family Medicine

## 2018-05-26 ENCOUNTER — Encounter: Payer: Self-pay | Admitting: Family Medicine

## 2018-05-26 VITALS — BP 136/86 | HR 72 | Ht 68.0 in | Wt 174.0 lb

## 2018-05-26 DIAGNOSIS — Z01419 Encounter for gynecological examination (general) (routine) without abnormal findings: Secondary | ICD-10-CM | POA: Diagnosis present

## 2018-05-26 DIAGNOSIS — Z124 Encounter for screening for malignant neoplasm of cervix: Secondary | ICD-10-CM | POA: Diagnosis not present

## 2018-05-26 DIAGNOSIS — R102 Pelvic and perineal pain: Secondary | ICD-10-CM | POA: Diagnosis present

## 2018-05-26 NOTE — Progress Notes (Addendum)
Name: Bianca Hawkins   MRN: [id number]    DOB: May 20, 1946   Date:05/26/2018       Progress Note  Subjective  Chief Complaint  Chief Complaint  Patient presents with  . pap smear needed    still has ovaries and uterus- had a pain on L) side/ no pain anymore    Gynecologic Exam  The patient's primary symptoms include pelvic pain. The patient's pertinent negatives include no genital itching, genital lesions, genital odor, genital rash, missed menses, vaginal bleeding or vaginal discharge. This is a recurrent problem. The current episode started more than 1 month ago (4-5 months). The problem occurs daily. The problem has been waxing and waning. The pain is moderate. Pertinent negatives include no abdominal pain, anorexia, back pain, chills, constipation, diarrhea, discolored urine, dysuria, fever, flank pain, frequency, headaches, hematuria, joint pain, joint swelling, nausea, painful intercourse, rash, sore throat, urgency or vomiting. The treatment provided mild relief. She is sexually active. She is postmenopausal. There is no history of a gynecological surgery.    No problem-specific Assessment & Plan notes found for this encounter.   Past Medical History:  Diagnosis Date  . Anxiety   . Depression    reccurent depression to due situational disturbance  . Diabetes mellitus without complication ([place])   . GERD (gastroesophageal reflux disease)   . Hyperlipidemia   . Hypertension   . Osteoporosis     Past Surgical History:  Procedure Laterality Date  . BIOPSY BREAST     benign  . BREAST BIOPSY Right 2014   core with clips;benign per pt  . COLONOSCOPY  2008   cleared for 10 years/ occults- 07/22/2015- negative  . INNER EAR SURGERY     benign tumor behind eardrum  . PARATHYROID EXPLORATION     nodule removed  . THYROID EXPLORATION     nodule removed    Family History  Problem Relation Age of Onset  . Ulcerative colitis Mother   . Heart attack Father   . Heart  disease Sister   . Birth defects Sister   . Healthy Brother   . Stroke Daughter   . Healthy Son   . Breast cancer Neg Hx     Social History   Socioeconomic History  . Marital status: Married    Spouse name: Not on file  . Number of children: 2  . Years of education: Not on file  . Highest education level: Not on file  Occupational History  . Occupation: Retired  Scientific laboratory technician  . Financial resource strain: Not hard at all  . Food insecurity:    Worry: Never true    Inability: Never true  . Transportation needs:    Medical: No    Non-medical: No  Tobacco Use  . Smoking status: Former Smoker    Packs/day: 0.50    Years: 30.00    Pack years: 15.00    Types: Cigarettes    Last attempt to quit: 2003    Years since quitting: 16.5  . Smokeless tobacco: Never Used  Substance and Sexual Activity  . Alcohol use: No    Alcohol/week: 0.0 oz  . Drug use: No  . Sexual activity: Not Currently  Lifestyle  . Physical activity:    Days per week: 0 days    Minutes per session: 0 min  . Stress: Not at all  Relationships  . Social connections:    Talks on phone: More than three times a week    Gets together:  Once a week    Attends religious service: More than 4 times per year    Active member of club or organization: Yes    Attends meetings of clubs or organizations: More than 4 times per year    Relationship status: Married  . Intimate partner violence:    Fear of current or ex partner: No    Emotionally abused: No    Physically abused: No    Forced sexual activity: No  Other Topics Concern  . Not on file  Social History Narrative  . Not on file    Allergies  Allergen Reactions  . Eggs Or Egg-Derived Products   . Other     Pepper  . Sulfa Antibiotics     Outpatient Medications Prior to Visit  Medication Sig Dispense Refill  . ACCU-CHEK AVIVA PLUS test strip USE AS DIRECTED 100 each 0  . aspirin 81 MG tablet Take 1 tablet by mouth daily.    . cetirizine (ZYRTEC) 5  MG tablet Take 1 tablet by mouth as needed.    . Cholecalciferol (VITAMIN D-1000 MAX ST) 1000 units tablet Take 1,000 mg by mouth daily.    . fluticasone (FLONASE) 50 MCG/ACT nasal spray Place 1 spray into both nostrils as needed.    Marland Kitchen glipiZIDE (GLUCOTROL XL) 5 MG 24 hr tablet     . metFORMIN (GLUCOPHAGE) 1000 MG tablet Take 1 tablet (1,000 mg total) by mouth 2 (two) times daily. (Patient taking differently: Take 1,000 mg by mouth 2 (two) times daily. Dr Honor Junes) 180 tablet 1  . metoprolol tartrate (LOPRESSOR) 50 MG tablet TAKE 1 TABLET BY MOUTH TWICE DAILY 180 tablet 0  . omeprazole (PRILOSEC) 20 MG capsule TAKE 1 CAPSULE BY MOUTH EVERY DAY 90 capsule 1  . ONGLYZA 5 MG TABS tablet TAKE ONE TABLET BY MOUTH EVERY DAY 90 tablet 1  . PARoxetine (PAXIL) 10 MG tablet Take 1 tablet (10 mg total) by mouth daily. 90 tablet 1  . ramipril (ALTACE) 5 MG capsule TAKE 1 CAPSULE BY MOUTH EVERY DAY 90 capsule 1  . simvastatin (ZOCOR) 20 MG tablet Take 1 tablet (20 mg total) by mouth daily. 90 tablet 1   No facility-administered medications prior to visit.     Review of Systems  Constitutional: Negative for chills, fever, malaise/fatigue and weight loss.  HENT: Negative for ear discharge, ear pain and sore throat.   Eyes: Negative for blurred vision.  Respiratory: Negative for cough, sputum production, shortness of breath and wheezing.   Cardiovascular: Negative for chest pain, palpitations and leg swelling.  Gastrointestinal: Negative for abdominal pain, anorexia, blood in stool, constipation, diarrhea, heartburn, melena, nausea and vomiting.  Genitourinary: Positive for pelvic pain. Negative for dysuria, flank pain, frequency, hematuria, missed menses, urgency and vaginal discharge.  Musculoskeletal: Negative for back pain, joint pain, myalgias and neck pain.  Skin: Negative for rash.  Neurological: Negative for dizziness, tingling, sensory change, focal weakness and headaches.  Endo/Heme/Allergies:  Negative for environmental allergies and polydipsia. Does not bruise/bleed easily.  Psychiatric/Behavioral: Negative for depression and suicidal ideas. The patient is not nervous/anxious and does not have insomnia.      Objective  Vitals:   05/26/18 0941  Pulse: 72  Weight: 174 lb (78.9 kg)  Height: 5\' 8"  (1.727 m)    Physical Exam  Constitutional: She is oriented to person, place, and time. She appears well-developed and well-nourished.  HENT:  Head: Normocephalic.  Right Ear: External ear normal.  Left Ear: External ear normal.  Mouth/Throat: Oropharynx is clear and moist.  Eyes: Pupils are equal, round, and reactive to light. Conjunctivae and EOM are normal. Lids are everted and swept, no foreign bodies found. Left eye exhibits no hordeolum. No foreign body present in the left eye. Right conjunctiva is not injected. Left conjunctiva is not injected. No scleral icterus.  Neck: Normal range of motion. Neck supple. No JVD present. No tracheal deviation present. No thyromegaly present.  Cardiovascular: Normal rate, regular rhythm, normal heart sounds and intact distal pulses. Exam reveals no gallop and no friction rub.  No murmur heard. Pulmonary/Chest: Effort normal and breath sounds normal. No respiratory distress. She has no wheezes. She has no rales.  Abdominal: Soft. Bowel sounds are normal. She exhibits no mass. There is no hepatosplenomegaly. There is no tenderness. There is no rebound and no guarding.  Genitourinary: Rectum normal, vagina normal and uterus normal. Rectal exam shows guaiac negative stool. Pelvic exam was performed with patient supine. There is no rash, tenderness, lesion or injury on the right labia. There is no rash, tenderness, lesion or injury on the left labia. Uterus is not deviated, not enlarged, not fixed and not tender. Cervix exhibits no motion tenderness, no discharge and no friability. Right adnexum displays no mass, no tenderness and no fullness. Left  adnexum displays no mass, no tenderness and no fullness.  Musculoskeletal: Normal range of motion. She exhibits no edema or tenderness.  Lymphadenopathy:    She has no cervical adenopathy. No inguinal adenopathy noted on the right or left side.  Neurological: She is alert and oriented to person, place, and time. She has normal strength. She displays normal reflexes. No cranial nerve deficit.  Skin: Skin is warm. No rash noted.  Psychiatric: She has a normal mood and affect. Her mood appears not anxious. She does not exhibit a depressed mood.  Nursing note and vitals reviewed.     Assessment & Plan  Problem List Items Addressed This Visit    None    Visit Diagnoses    Pelvic pain    -  Primary   Decreasing yet persistent pelvic pain. No additional sx's vaginal blleeding, constiation, nor discharge. Gyn exam normal. If continued pain next step ultrasound   Relevant Orders   Cytology - PAP   Encounter for gynecological examination       Patient desires gyn exam for pelvic pain and dysparinia.   Relevant Orders   Cytology - PAP   Pap smear for cervical cancer screening       Patient desires PAP given recent activity and pelvic pain.   Relevant Orders   Cytology - PAP      No orders of the defined types were placed in this encounter. Aevah Stansbery is a 72 y.o. female who presents today for her Complete Annual Exam. She feels well. She reports exercising . She reports she is sleeping well..  Immunizations are reviewed and recommendations provided.   Age appropriate screening tests are discussed. Counseling given for risk factor reduction interventions.  Dr. [facility]  05/26/18

## 2018-05-27 LAB — CYTOLOGY - PAP: DIAGNOSIS: NEGATIVE

## 2018-06-13 ENCOUNTER — Other Ambulatory Visit: Payer: Self-pay | Admitting: Family Medicine

## 2018-06-13 DIAGNOSIS — F419 Anxiety disorder, unspecified: Secondary | ICD-10-CM

## 2018-06-13 DIAGNOSIS — K219 Gastro-esophageal reflux disease without esophagitis: Secondary | ICD-10-CM

## 2018-06-13 DIAGNOSIS — Z8659 Personal history of other mental and behavioral disorders: Secondary | ICD-10-CM

## 2018-06-13 DIAGNOSIS — I10 Essential (primary) hypertension: Secondary | ICD-10-CM

## 2018-06-13 DIAGNOSIS — E7800 Pure hypercholesterolemia, unspecified: Secondary | ICD-10-CM

## 2018-07-31 ENCOUNTER — Telehealth: Payer: Self-pay | Admitting: Family Medicine

## 2018-07-31 NOTE — Telephone Encounter (Signed)
resched awv  °

## 2018-08-21 ENCOUNTER — Other Ambulatory Visit: Payer: Self-pay | Admitting: Family Medicine

## 2018-08-21 DIAGNOSIS — I10 Essential (primary) hypertension: Secondary | ICD-10-CM

## 2018-09-18 ENCOUNTER — Other Ambulatory Visit: Payer: Self-pay | Admitting: Family Medicine

## 2018-09-18 DIAGNOSIS — E7800 Pure hypercholesterolemia, unspecified: Secondary | ICD-10-CM

## 2018-09-18 DIAGNOSIS — K219 Gastro-esophageal reflux disease without esophagitis: Secondary | ICD-10-CM

## 2018-09-18 DIAGNOSIS — I10 Essential (primary) hypertension: Secondary | ICD-10-CM

## 2018-09-18 DIAGNOSIS — F419 Anxiety disorder, unspecified: Secondary | ICD-10-CM

## 2018-09-18 DIAGNOSIS — Z8659 Personal history of other mental and behavioral disorders: Secondary | ICD-10-CM

## 2018-09-24 ENCOUNTER — Ambulatory Visit: Payer: Medicare Other | Admitting: Family Medicine

## 2018-09-24 ENCOUNTER — Encounter: Payer: Self-pay | Admitting: Family Medicine

## 2018-09-24 VITALS — BP 130/80 | HR 76 | Ht 68.0 in | Wt 175.0 lb

## 2018-09-24 DIAGNOSIS — F419 Anxiety disorder, unspecified: Secondary | ICD-10-CM

## 2018-09-24 DIAGNOSIS — I10 Essential (primary) hypertension: Secondary | ICD-10-CM

## 2018-09-24 DIAGNOSIS — Z8659 Personal history of other mental and behavioral disorders: Secondary | ICD-10-CM | POA: Diagnosis not present

## 2018-09-24 DIAGNOSIS — H6982 Other specified disorders of Eustachian tube, left ear: Secondary | ICD-10-CM | POA: Diagnosis not present

## 2018-09-24 DIAGNOSIS — K219 Gastro-esophageal reflux disease without esophagitis: Secondary | ICD-10-CM

## 2018-09-24 DIAGNOSIS — E7800 Pure hypercholesterolemia, unspecified: Secondary | ICD-10-CM | POA: Insufficient documentation

## 2018-09-24 MED ORDER — METOPROLOL TARTRATE 50 MG PO TABS: 50.0000 mg | ORAL_TABLET | Freq: Two times a day (BID) | ORAL | 5 refills | Status: DC

## 2018-09-24 MED ORDER — OMEPRAZOLE 20 MG PO CPDR: DELAYED_RELEASE_CAPSULE | ORAL | 5 refills | Status: DC

## 2018-09-24 MED ORDER — RAMIPRIL 5 MG PO CAPS: ORAL_CAPSULE | ORAL | 5 refills | Status: DC

## 2018-09-24 MED ORDER — PAROXETINE HCL 10 MG PO TABS: 10.0000 mg | ORAL_TABLET | Freq: Every day | ORAL | 5 refills | Status: DC

## 2018-09-24 MED ORDER — SIMVASTATIN 20 MG PO TABS: 20.0000 mg | ORAL_TABLET | Freq: Every day | ORAL | 5 refills | Status: DC

## 2018-09-24 NOTE — Progress Notes (Signed)
Date:  09/24/2018   Name:  Bianca Hawkins   DOB:  Apr 25, 1946   MRN:  [id number]   Chief Complaint: Hypertension; Hyperlipidemia; Gastroesophageal Reflux; Allergic Rhinitis ; and Depression (takes fluoxetine) Hypertension  This is a chronic problem. The current episode started more than 1 month ago. The problem is unchanged. The problem is controlled. Pertinent negatives include no anxiety, blurred vision, chest pain, headaches, malaise/fatigue, neck pain, orthopnea, palpitations, peripheral edema, PND, shortness of breath or sweats. There are no associated agents to hypertension. There are no known risk factors for coronary artery disease. Past treatments include ACE inhibitors and beta blockers. The current treatment provides moderate improvement. There are no compliance problems.  There is no history of angina, kidney disease, CAD/MI, CVA, heart failure, left ventricular hypertrophy, PVD or retinopathy. There is no history of chronic renal disease, a hypertension causing med or renovascular disease.  Hyperlipidemia  This is a chronic problem. The current episode started more than 1 year ago. The problem is controlled. Recent lipid tests were reviewed and are normal. She has no history of chronic renal disease, diabetes, hypothyroidism, liver disease, obesity or nephrotic syndrome. There are no known factors aggravating her hyperlipidemia. Pertinent negatives include no chest pain, focal sensory loss, focal weakness, leg pain, myalgias or shortness of breath. Current antihyperlipidemic treatment includes statins. The current treatment provides moderate improvement of lipids. There are no compliance problems.   Gastroesophageal Reflux  She reports no abdominal pain, no belching, no chest pain, no choking, no coughing, no dysphagia, no early satiety, no globus sensation, no heartburn, no hoarse voice, no nausea, no sore throat, no stridor, no tooth decay, no water brash or no wheezing. This is a  chronic problem. The current episode started more than 1 year ago. The problem occurs occasionally. The problem has been waxing and waning. The symptoms are aggravated by certain foods. Pertinent negatives include no anemia, fatigue, melena, muscle weakness, orthopnea or weight loss. Risk factors include NSAIDs. She has tried a PPI for the symptoms. The treatment provided moderate relief.  Depression       The patient presents with depression.  This is a chronic problem.  The current episode started more than 1 year ago.   The onset quality is gradual.   The problem occurs rarely.  The problem has been gradually improving since onset.  Associated symptoms include no decreased concentration, no fatigue, no helplessness, no hopelessness, does not have insomnia, not irritable, no restlessness, no decreased interest, no appetite change, no body aches, no myalgias, no headaches, no indigestion, not sad and no suicidal ideas.  Past treatments include SSRIs - Selective serotonin reuptake inhibitors.  Compliance with treatment is good.  Previous treatment provided moderate relief.  Past medical history includes depression.     Pertinent negatives include no hypothyroidism and no anxiety.    Review of Systems  Constitutional: Negative for appetite change, chills, fatigue, fever, malaise/fatigue and weight loss.  HENT: Negative for drooling, ear discharge, ear pain, hoarse voice and sore throat.   Eyes: Negative for blurred vision.  Respiratory: Negative for cough, choking, shortness of breath and wheezing.   Cardiovascular: Negative for chest pain, palpitations, orthopnea, leg swelling and PND.  Gastrointestinal: Negative for abdominal pain, blood in stool, constipation, diarrhea, dysphagia, heartburn, melena and nausea.  Endocrine: Negative for polydipsia.  Genitourinary: Negative for dysuria, frequency, hematuria and urgency.  Musculoskeletal: Negative for back pain, myalgias, muscle weakness and neck pain.    Skin: Negative for rash.  Allergic/Immunologic: Negative for environmental allergies.  Neurological: Negative for dizziness, focal weakness and headaches.  Hematological: Does not bruise/bleed easily.  Psychiatric/Behavioral: Positive for depression. Negative for decreased concentration and suicidal ideas. The patient is not nervous/anxious and does not have insomnia.     Patient Active Problem List   Diagnosis Date Noted  . History of biliary T-tube placement 01/22/2018  . Recurrent major depressive episodes ([place]) 01/22/2018  . Age-related osteoporosis without current pathological fracture 09/09/2017  . Hyperlipidemia due to type 2 diabetes mellitus ([place]) 09/09/2017  . Panic attacks 04/16/2017  . Hyperparathyroidism, primary ([place]) 03/01/2017  . Multinodular goiter 03/01/2017  . Hypertension associated with diabetes ([place]) 06/28/2016  . Chronic anxiety 06/28/2016  . Type 2 diabetes mellitus without complication, without long-term current use of insulin ([place]) 08/09/2015  . Familial multiple lipoprotein-type hyperlipidemia 08/09/2015  . Pain in joint 08/09/2015  . Encounter for general adult medical examination without abnormal findings 08/09/2015  . Chronic obstructive pulmonary disease (COPD) ([place]) 08/09/2015  . H/O: depression 08/09/2015  . Gastro-esophageal reflux disease without esophagitis 08/09/2015  . Blood glucose elevated 08/09/2015  . Encounter for screening for osteoporosis 08/09/2015  . Need for vaccination 08/09/2015  . Chronic MEE (middle ear effusion) 08/09/2015  . 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 Eggs Or Egg-Derived Products   . Other     Pepper  . Sulfa Antibiotics     Past Surgical History:  Procedure Laterality Date  . BIOPSY BREAST     benign  . BREAST BIOPSY Right 2014   core with clips;benign per pt  . COLONOSCOPY  2008   cleared for 10 years/ occults- 07/22/2015- negative  . INNER EAR SURGERY      benign tumor behind eardrum  . PARATHYROID EXPLORATION     nodule removed  . THYROID EXPLORATION     nodule removed    Social History   Tobacco Use  . Smoking status: Former Smoker    Packs/day: 0.50    Years: 30.00    Pack years: 15.00    Types: Cigarettes    Last attempt to quit: 2003    Years since quitting: 16.8  . Smokeless tobacco: Never Used  Substance Use Topics  . Alcohol use: No    Alcohol/week: 0.0 standard drinks  . Drug use: No     Medication list has been reviewed and updated.  Current Meds  Medication Sig  . ACCU-CHEK AVIVA PLUS test strip USE AS DIRECTED  . aspirin 81 MG tablet Take 1 tablet by mouth daily.  . cetirizine (ZYRTEC) 5 MG tablet Take 1 tablet by mouth as needed.  . Cholecalciferol (VITAMIN D-1000 MAX ST) 1000 units tablet Take 1,000 mg by mouth daily.  . fluticasone (FLONASE) 50 MCG/ACT nasal spray Place 1 spray into both nostrils as needed.  Marland Kitchen glipiZIDE (GLUCOTROL XL) 5 MG 24 hr tablet   . metFORMIN (GLUCOPHAGE) 1000 MG tablet Take 1 tablet (1,000 mg total) by mouth 2 (two) times daily. (Patient taking differently: Take 1,000 mg by mouth 2 (two) times daily. Dr Honor Junes)  . metoprolol tartrate (LOPRESSOR) 50 MG tablet TAKE ONE TABLET TWICE DAILY  . omeprazole (PRILOSEC) 20 MG capsule TAKE 1 CAPSULE EVERY DAY  . ONGLYZA 5 MG TABS tablet TAKE ONE TABLET BY MOUTH EVERY DAY  . PARoxetine (PAXIL) 10 MG tablet TAKE ONE TABLET EVERY DAY  . ramipril (ALTACE) 5 MG capsule TAKE 1 CAPSULE EVERY DAY  . simvastatin (ZOCOR) 20  MG tablet TAKE ONE TABLET EVERY DAY    PHQ 2/9 Scores 09/24/2018 10/10/2017 05/28/2017 03/22/2016  PHQ - 2 Score 0 0 0 0  PHQ- 9 Score 0 - - -    Physical Exam  Constitutional: She is not irritable. No distress.  HENT:  Head: Normocephalic and atraumatic.  Right Ear: Hearing, tympanic membrane, external ear and ear canal normal.  Left Ear: Hearing, external ear and ear canal normal. Tympanic membrane is retracted.  Nose:  Nose normal.  Mouth/Throat: Oropharynx is clear and moist.  Eyes: Pupils are equal, round, and reactive to light. Conjunctivae and EOM are normal. Right eye exhibits no discharge. Left eye exhibits no discharge.  Neck: Normal range of motion. Neck supple. No JVD present. No thyromegaly present.  Cardiovascular: Normal rate, regular rhythm, normal heart sounds and intact distal pulses. Exam reveals no gallop and no friction rub.  No murmur heard. Pulmonary/Chest: Effort normal and breath sounds normal.  Abdominal: Soft. Bowel sounds are normal. She exhibits no mass. There is no tenderness. There is no guarding.  Musculoskeletal: Normal range of motion. She exhibits no edema.  Lymphadenopathy:    She has no cervical adenopathy.  Neurological: She is alert. She has normal reflexes.  Skin: Skin is warm and dry. She is not diaphoretic.  Nursing note and vitals reviewed.   BP 130/80   Pulse 76   Ht 5\' 8"  (1.727 m)   Wt 175 lb (79.4 kg)   BMI 26.61 kg/m   Assessment and Plan:  1. H/O: depression Chronic.  Controlled.  Continue paroxetine 10 mg 1 a day. - PARoxetine (PAXIL) 10 MG tablet; Take 1 tablet (10 mg total) by mouth daily.  Dispense: 30 tablet; Refill: 5  2. Chronic anxiety Chronic.  Controlled.  Continue paroxetine 10 mg. - PARoxetine (PAXIL) 10 MG tablet; Take 1 tablet (10 mg total) by mouth daily.  Dispense: 30 tablet; Refill: 5  3. Essential hypertension Controlled.  Chronic.  Continue ramipril daily and metoprolol 50 mg twice daily.  Reviewed previous labs and they are normal - ramipril (ALTACE) 5 MG capsule; TAKE 1 CAPSULE EVERY DAY  Dispense: 30 capsule; Refill: 5 - metoprolol tartrate (LOPRESSOR) 50 MG tablet; Take 1 tablet (50 mg total) by mouth 2 (two) times daily.  Dispense: 60 tablet; Refill: 5  4. Gastroesophageal reflux disease without esophagitis Chronic.  Continue omeprazole 20 mg 1 a Flonase  5. Pure hypercholesterolemia Chronic Controlled Continue  simvastatin 20 mg. - simvastatin (ZOCOR) 20 MG tablet; Take 1 tablet (20 mg total) by mouth daily.  Dispense: 30 tablet; Refill: 5  6. Dysfunction of left eustachian tube Acute. Suggest resuming flonase with short period of Afrin.  Dr. [facility]  09/24/2018

## 2018-09-25 ENCOUNTER — Telehealth: Payer: Self-pay

## 2018-09-25 NOTE — Telephone Encounter (Signed)
Please call in 90 days on Monday of refills done this week for 30 days. Has enough for this week but wants 90 days sent next week. Per VM

## 2018-10-13 ENCOUNTER — Other Ambulatory Visit: Payer: Self-pay

## 2018-10-13 ENCOUNTER — Ambulatory Visit (INDEPENDENT_AMBULATORY_CARE_PROVIDER_SITE_OTHER): Payer: Medicare Other

## 2018-10-13 VITALS — BP 140/84 | HR 80 | Resp 16 | Ht 68.0 in | Wt 176.4 lb

## 2018-10-13 DIAGNOSIS — Z1231 Encounter for screening mammogram for malignant neoplasm of breast: Secondary | ICD-10-CM | POA: Diagnosis not present

## 2018-10-13 DIAGNOSIS — Z0000 Encounter for general adult medical examination without abnormal findings: Secondary | ICD-10-CM

## 2018-10-13 NOTE — Progress Notes (Addendum)
Subjective:   Bianca Hawkins is a 72 y.o. female who presents for Medicare Annual (Subsequent) preventive examination.  Review of Systems:   Cardiac Risk Factors include: dyslipidemia;diabetes mellitus;hypertension advanced age female > 48     Objective:     Vitals: Pulse 80   Ht 5\' 8"  (1.727 m)   Wt 176 lb 6.4 oz (80 kg)   BMI 26.82 kg/m   Body mass index is 26.82 kg/m.  Advanced Directives 10/13/2018 10/10/2017 09/05/2015 08/09/2015  Does Patient Have a Medical Advance Directive? Yes No;Yes Yes Yes  Type of Advance Directive Living will;Healthcare Power of [place];Living will Living will;Healthcare Power of Attorney -  Copy of [place] in Chart? No - copy requested No - copy requested No - copy requested No - copy requested    Tobacco Social History   Tobacco Use  Smoking Status Former Smoker  . Packs/day: 0.50  . Years: 30.00  . Pack years: 15.00  . Types: Cigarettes  . Last attempt to quit: 2003  . Years since quitting: 16.9  Smokeless Tobacco Never Used     Counseling given: Not Answered   Clinical Intake:  Pre-visit preparation completed: Yes  Pain : No/denies pain     Nutritional Status: BMI 25 -29 Overweight Nutritional Risks: None Diabetes: Yes CBG done?: No Did pt. bring in CBG monitor from home?: No   Nutrition Risk Assessment:  Has the patient had any N/V/D within the last 2 months?  No  Does the patient have any non-healing wounds?  No  Has the patient had any unintentional weight loss or weight gain?  No   Diabetes:  Is the patient diabetic?  Yes  If diabetic, was a CBG obtained today?  No  Did the patient bring in their glucometer from home?  No  How often do you monitor your CBG's? Daily in the morning.   Financial Strains and Diabetes Management:  Are you having any financial strains with the device, your supplies or your medication? No .  Does the patient want to be seen  by Chronic Care Management for management of their diabetes?  No  Would the patient like to be referred to a Nutritionist or for Diabetic Management?  No   Diabetic Exams:  Diabetic Eye Exam: Completed 10/07/17. Negative for retinopathy.  Diabetic Foot Exam: Completed 09/01/17. Pt has been advised about the importance in completing this exam. Pt is scheduled for diabetic foot exam with Dr. Caprice Renshaw in January.   How often do you need to have someone help you when you read instructions, pamphlets, or other written materials from your doctor or pharmacy?: 1 - Never What is the last grade level you completed in school?: associates degree  Interpreter Needed?: No  Information entered by :: Clemetine Marker LPN  Past Medical History:  Diagnosis Date  . Allergy   . Anxiety   . Depression    reccurent depression to due situational disturbance  . Diabetes mellitus without complication ([place])   . GERD (gastroesophageal reflux disease)   . Hyperlipidemia   . Hypertension   . Osteoporosis    Past Surgical History:  Procedure Laterality Date  . BIOPSY BREAST     benign  . BREAST BIOPSY Right 2014   core with clips;benign per pt  . COLONOSCOPY  2008   cleared for 10 years/ occults- 07/22/2015- negative  . INNER EAR SURGERY     benign tumor behind eardrum  . PARATHYROID  EXPLORATION     nodule removed  . THYROID EXPLORATION  04/2017   nodule removed   Family History  Problem Relation Age of Onset  . Ulcerative colitis Mother   . Heart attack Father   . Heart disease Sister   . Birth defects Sister   . Healthy Brother   . Stroke Daughter   . Healthy Son   . Breast cancer Neg Hx    Social History   Socioeconomic History  . Marital status: Widowed    Spouse name: Not on file  . Number of children: 2  . Years of education: Not on file  . Highest education level: Associate degree: academic program  Occupational History  . Occupation: Retired  Scientific laboratory technician  . Financial  resource strain: Not hard at all  . Food insecurity:    Worry: Never true    Inability: Never true  . Transportation needs:    Medical: No    Non-medical: No  Tobacco Use  . Smoking status: Former Smoker    Packs/day: 0.50    Years: 30.00    Pack years: 15.00    Types: Cigarettes    Last attempt to quit: 2003    Years since quitting: 16.9  . Smokeless tobacco: Never Used  Substance and Sexual Activity  . Alcohol use: No    Alcohol/week: 0.0 standard drinks  . Drug use: No  . Sexual activity: Not Currently  Lifestyle  . Physical activity:    Days per week: 0 days    Minutes per session: 0 min  . Stress: Not at all  Relationships  . Social connections:    Talks on phone: More than three times a week    Gets together: Once a week    Attends religious service: More than 4 times per year    Active member of club or organization: Yes    Attends meetings of clubs or organizations: More than 4 times per year    Relationship status: Married  Other Topics Concern  . Not on file  Social History Narrative  . Not on file    Outpatient Encounter Medications as of 10/13/2018  Medication Sig  . ACCU-CHEK AVIVA PLUS test strip USE AS DIRECTED  . aspirin 81 MG tablet Take 1 tablet by mouth daily.  . cetirizine (ZYRTEC) 5 MG tablet Take 1 tablet by mouth as needed.  . Cholecalciferol (VITAMIN D-1000 MAX ST) 1000 units tablet Take 1,000 mg by mouth daily.  . fluticasone (FLONASE) 50 MCG/ACT nasal spray Place 1 spray into both nostrils as needed.  Marland Kitchen glipiZIDE (GLUCOTROL XL) 5 MG 24 hr tablet   . metFORMIN (GLUCOPHAGE) 1000 MG tablet Take 1 tablet (1,000 mg total) by mouth 2 (two) times daily. (Patient taking differently: Take 1,000 mg by mouth 2 (two) times daily. Dr Honor Junes)  . metoprolol tartrate (LOPRESSOR) 50 MG tablet Take 1 tablet (50 mg total) by mouth 2 (two) times daily.  Marland Kitchen omeprazole (PRILOSEC) 20 MG capsule TAKE 1 CAPSULE EVERY DAY  . ONGLYZA 5 MG TABS tablet TAKE ONE TABLET  BY MOUTH EVERY DAY  . PARoxetine (PAXIL) 10 MG tablet Take 1 tablet (10 mg total) by mouth daily.  . ramipril (ALTACE) 5 MG capsule TAKE 1 CAPSULE EVERY DAY  . simvastatin (ZOCOR) 20 MG tablet Take 1 tablet (20 mg total) by mouth daily.   No facility-administered encounter medications on file as of 10/13/2018.     Activities of Daily Living In your present state of  health, do you have any difficulty performing the following activities: 10/13/2018  Hearing? N  Comment pt declines hearing aids  Vision? N  Comment wears glasses  Difficulty concentrating or making decisions? N  Walking or climbing stairs? N  Dressing or bathing? N  Doing errands, shopping? N  Preparing Food and eating ? N  Using the Toilet? N  In the past six months, have you accidently leaked urine? N  Comment wears pads for protection  Do you have problems with loss of bowel control? N  Managing your Medications? N  Managing your Finances? N  Housekeeping or managing your Housekeeping? N  Some recent data might be hidden    Patient Care Team: Juline Patch, MD as PCP - General (Family Medicine) Lonia Farber, MD as Consulting Physician (Internal Medicine)    Assessment:   This is a routine wellness examination for [place].  Exercise Activities and Dietary recommendations Current Exercise Habits: The patient does not participate in regular exercise at present  Goals    . Exercise 150 min/wk Moderate Activity     Recommend to exercise at least 150 minutes per week       Fall Risk Fall Risk  10/13/2018 10/10/2017 05/28/2017 03/22/2016 09/05/2015  Falls in the past year? 0 No No No No  Number falls in past yr: 0 - - - -  Risk for fall due to : - - History of fall(s) - -   FALL RISK PREVENTION PERTAINING TO THE HOME:  Any stairs in or around the home WITH handrails? No  Home free of loose throw rugs in walkways, pet beds, electrical cords, etc? Yes  Adequate lighting in your home to reduce risk of  falls? Yes   ASSISTIVE DEVICES UTILIZED TO PREVENT FALLS:  Life alert? No  Use of a cane, walker or w/c? No  Grab bars in the bathroom? Yes  Shower chair or bench in shower? No  Elevated toilet seat or a handicapped toilet? No   DME ORDERS:  DME order needed?  No   TIMED UP AND GO:  Was the test performed? Yes .  Length of time to ambulate 10 feet: 6 sec.   GAIT:  Appearance of gait: Gait stead-fast and without the use of an assistive device.  Education: Fall risk prevention has been discussed.  Intervention(s) required? No    Depression Screen PHQ 2/9 Scores 10/13/2018 09/24/2018 10/10/2017 05/28/2017  PHQ - 2 Score 0 0 0 0  PHQ- 9 Score 0 0 - -     Cognitive Function Pt declined 6CIT     6CIT Screen 10/10/2017  What Year? 0 points  What month? 0 points  What time? 0 points  Count back from 20 0 points  Months in reverse 0 points  Repeat phrase 0 points  Total Score 0    Immunization History  Administered Date(s) Administered  . Pneumococcal Polysaccharide-23 09/03/2013  . Tdap 09/03/2013  . Zoster 11/13/2011    Qualifies for Shingles Vaccine? Yes  Zostavax completed 2013. Due for Shingrix. Education has been provided regarding the importance of this vaccine. Pt has been advised to call insurance company to determine out of pocket expense. Advised may also receive vaccine at local pharmacy or Health Dept. Verbalized acceptance and understanding.    Tdap: Up to date  Flu Vaccine: not a candidate for flu vaccine, pt is allergic to eggs.   Pneumococcal Vaccine: Due for Pneumococcal vaccine. Does the patient want to receive this vaccine today?  No . Education has been provided regarding the importance of this vaccine but still declined. Advised may receive this vaccine at local pharmacy or Health Dept. Aware to provide a copy of the vaccination record if obtained from local pharmacy or Health Dept. Verbalized acceptance and understanding. Pt has hx of reaction to  PPSv23 and hesitant to receive PCV 13   Screening Tests Health Maintenance  Topic Date Due  . Fecal DNA (Cologuard)  07/21/2018  . FOOT EXAM  09/01/2018  . OPHTHALMOLOGY EXAM  10/07/2018  . MAMMOGRAM  01/01/2019  . HEMOGLOBIN A1C  02/26/2019  . TETANUS/TDAP  09/04/2023  . DEXA SCAN  Completed  . Hepatitis C Screening  Completed  . PNA vac Low Risk Adult  Completed  . INFLUENZA VACCINE  Discontinued    Cancer Screenings:  Colorectal Screening: Cologurad Completed 07/22/15. Repeat every 3 years; Cologuard ordered today.  Mammogram: Completed 01/01/18. Repeat every year;  Ordered today. Pt provided with contact information and advised to call to schedule appt.   Bone Density: Completed 10/31/16. Results reflect OSTEOPOROSIS. Repeat every 2 years. Pt on prolia and managed by Dr. Caprice Renshaw.  Lung Cancer Screening: (Low Dose CT Chest recommended if Age 65-80 years, 30 pack-year currently smoking OR have quit w/in 15years.) does not qualify.    Additional Screening:  Hepatitis C Screening: does qualify; Completed 09/09/17  Vision Screening: Recommended annual ophthalmology exams for early detection of glaucoma and other disorders of the eye. Is the patient up to date with their annual eye exam?  Yes  Who is the provider or what is the name of the office in which the pt attends annual eye exams? [place] Screening: Recommended annual dental exams for proper oral hygiene  Community Resource Referral:  CRR required this visit?  No       Plan:    I have personally reviewed and addressed the Medicare Annual Wellness questionnaire and have noted the following in the patient's chart:  A. Medical and social history B. Use of alcohol, tobacco or illicit drugs  C. Current medications and supplements D. Functional ability and status E.  Nutritional status F.  Physical activity G. Advance directives H. List of other physicians I.  Hospitalizations, surgeries, and ER  visits in previous 12 months J.  [place] such as hearing and vision if needed, cognitive and depression L. Referrals and appointments   In addition, I have reviewed and discussed with patient certain preventive protocols, quality metrics, and best practice recommendations. A written personalized care plan for preventive services as well as general preventive health recommendations were provided to patient.   Signed,  Clemetine Marker, LPN Nurse Health Advisor    Nurse Notes:BP recheck done by Berton Mount to due elevated BP at start of visit. Mammogram ordered today. Pt declined colonoscopy but agreed to Cologuard which was due since it has been 3 years since last Cologuard completed. Discussed this with Baxter Flattery who advised

## 2018-10-13 NOTE — Patient Instructions (Signed)
Bianca Hawkins , Thank you for taking time to come for your Medicare Wellness Visit. I appreciate your ongoing commitment to your health goals. Please review the following plan we discussed and let me know if I can assist you in the future.   Screening recommendations/referrals: Colonoscopy: Cologuard done 2016 reordered today. Mammogram: Done 01/01/18. Please call [phone] to schedule your mammogram.  Bone Density: done 2017 Recommended yearly ophthalmology/optometry visit for glaucoma screening and checkup Recommended yearly dental visit for hygiene and checkup  Vaccinations: Pneumococcal vaccine: post poned Tdap vaccine: done 09/03/13 Shingles vaccine: Shingrix discussed. Please contact your insurance company for coverage information.     Advanced directives: Please bring a copy of your health care power of attorney and living will to the office at your convenience.  Conditions/risks identified: Recommend increasing physical activity to 150 minutes per week.   Next appointment: Please follow up in one year for your Medicare Annual Wellness visit.      Preventive Care 73 Years and Older, Female Preventive care refers to lifestyle choices and visits with your health care provider that can promote health and wellness. What does preventive care include?  A yearly physical exam. This is also called an annual well check.  Dental exams once or twice a year.  Routine eye exams. Ask your health care provider how often you should have your eyes checked.  Personal lifestyle choices, including:  Daily care of your teeth and gums.  Regular physical activity.  Eating a healthy diet.  Avoiding tobacco and drug use.  Limiting alcohol use.  Practicing safe sex.  Taking low-dose aspirin every day.  Taking vitamin and mineral supplements as recommended by your health care provider. What happens during an annual well check? The services and screenings done by your health care  provider during your annual well check will depend on your age, overall health, lifestyle risk factors, and family history of disease. Counseling  Your health care provider may ask you questions about your:  Alcohol use.  Tobacco use.  Drug use.  Emotional well-being.  Home and relationship well-being.  Sexual activity.  Eating habits.  History of falls.  Memory and ability to understand (cognition).  Work and work Statistician.  Reproductive health. Screening  You may have the following tests or measurements:  Height, weight, and BMI.  Blood pressure.  Lipid and cholesterol levels. These may be checked every 5 years, or more frequently if you are over 25 years old.  Skin check.  Lung cancer screening. You may have this screening every year starting at age 3 if you have a 30-pack-year history of smoking and currently smoke or have quit within the past 15 years.  Fecal occult blood test (FOBT) of the stool. You may have this test every year starting at age 43.  Flexible sigmoidoscopy or colonoscopy. You may have a sigmoidoscopy every 5 years or a colonoscopy every 10 years starting at age 23.  Hepatitis C blood test.  Hepatitis B blood test.  Sexually transmitted disease (STD) testing.  Diabetes screening. This is done by checking your blood sugar (glucose) after you have not eaten for a while (fasting). You may have this done every 1-3 years.  Bone density scan. This is done to screen for osteoporosis. You may have this done starting at age 23.  Mammogram. This may be done every 1-2 years. Talk to your health care provider about how often you should have regular mammograms. Talk with your health care provider about your test results,  treatment options, and if necessary, the need for more tests. Vaccines  Your health care provider may recommend certain vaccines, such as:  Influenza vaccine. This is recommended every year.  Tetanus, diphtheria, and acellular  pertussis (Tdap, Td) vaccine. You may need a Td booster every 10 years.  Zoster vaccine. You may need this after age 46.  Pneumococcal 13-valent conjugate (PCV13) vaccine. One dose is recommended after age 85.  Pneumococcal polysaccharide (PPSV23) vaccine. One dose is recommended after age 66. Talk to your health care provider about which screenings and vaccines you need and how often you need them. This information is not intended to replace advice given to you by your health care provider. Make sure you discuss any questions you have with your health care provider. Document Released: 11/25/2015 Document Revised: 07/18/2016 Document Reviewed: 08/30/2015 Elsevier Interactive Patient Education  2017 [place] Prevention in the Home Falls can cause injuries. They can happen to people of all ages. There are many things you can do to make your home safe and to help prevent falls. What can I do on the outside of my home?  Regularly fix the edges of walkways and driveways and fix any cracks.  Remove anything that might make you trip as you walk through a door, such as a raised step or threshold.  Trim any bushes or trees on the path to your home.  Use bright outdoor lighting.  Clear any walking paths of anything that might make someone trip, such as rocks or tools.  Regularly check to see if handrails are loose or broken. Make sure that both sides of any steps have handrails.  Any raised decks and porches should have guardrails on the edges.  Have any leaves, snow, or ice cleared regularly.  Use sand or salt on walking paths during winter.  Clean up any spills in your garage right away. This includes oil or grease spills. What can I do in the bathroom?  Use night lights.  Install grab bars by the toilet and in the tub and shower. Do not use towel bars as grab bars.  Use non-skid mats or decals in the tub or shower.  If you need to sit down in the shower, use a plastic,  non-slip stool.  Keep the floor dry. Clean up any water that spills on the floor as soon as it happens.  Remove soap buildup in the tub or shower regularly.  Attach bath mats securely with double-sided non-slip rug tape.  Do not have throw rugs and other things on the floor that can make you trip. What can I do in the bedroom?  Use night lights.  Make sure that you have a light by your bed that is easy to reach.  Do not use any sheets or blankets that are too big for your bed. They should not hang down onto the floor.  Have a firm chair that has side arms. You can use this for support while you get dressed.  Do not have throw rugs and other things on the floor that can make you trip. What can I do in the kitchen?  Clean up any spills right away.  Avoid walking on wet floors.  Keep items that you use a lot in easy-to-reach places.  If you need to reach something above you, use a strong step stool that has a grab bar.  Keep electrical cords out of the way.  Do not use floor polish or wax that makes floors  slippery. If you must use wax, use non-skid floor wax.  Do not have throw rugs and other things on the floor that can make you trip. What can I do with my stairs?  Do not leave any items on the stairs.  Make sure that there are handrails on both sides of the stairs and use them. Fix handrails that are broken or loose. Make sure that handrails are as long as the stairways.  Check any carpeting to make sure that it is firmly attached to the stairs. Fix any carpet that is loose or worn.  Avoid having throw rugs at the top or bottom of the stairs. If you do have throw rugs, attach them to the floor with carpet tape.  Make sure that you have a light switch at the top of the stairs and the bottom of the stairs. If you do not have them, ask someone to add them for you. What else can I do to help prevent falls?  Wear shoes that:  Do not have high heels.  Have rubber  bottoms.  Are comfortable and fit you well.  Are closed at the toe. Do not wear sandals.  If you use a stepladder:  Make sure that it is fully opened. Do not climb a closed stepladder.  Make sure that both sides of the stepladder are locked into place.  Ask someone to hold it for you, if possible.  Clearly mark and make sure that you can see:  Any grab bars or handrails.  First and last steps.  Where the edge of each step is.  Use tools that help you move around (mobility aids) if they are needed. These include:  Canes.  Walkers.  Scooters.  Crutches.  Turn on the lights when you go into a dark area. Replace any light bulbs as soon as they burn out.  Set up your furniture so you have a clear path. Avoid moving your furniture around.  If any of your floors are uneven, fix them.  If there are any pets around you, be aware of where they are.  Review your medicines with your doctor. Some medicines can make you feel dizzy. This can increase your chance of falling. Ask your doctor what other things that you can do to help prevent falls. This information is not intended to replace advice given to you by your health care provider. Make sure you discuss any questions you have with your health care provider. Document Released: 08/25/2009 Document Revised: 04/05/2016 Document Reviewed: 12/03/2014 Elsevier Interactive Patient Education  2017 [employer].

## 2018-10-13 NOTE — Progress Notes (Unsigned)
Pt does "not want to have a colonoscopy or cologuard at this time"

## 2018-10-15 ENCOUNTER — Ambulatory Visit: Payer: Medicare Other

## 2018-10-16 ENCOUNTER — Ambulatory Visit: Payer: Medicare Other

## 2018-12-26 ENCOUNTER — Other Ambulatory Visit: Payer: Self-pay | Admitting: Family Medicine

## 2018-12-26 DIAGNOSIS — I10 Essential (primary) hypertension: Secondary | ICD-10-CM

## 2019-01-13 ENCOUNTER — Encounter: Payer: Self-pay | Admitting: Family Medicine

## 2019-01-13 ENCOUNTER — Other Ambulatory Visit: Payer: Self-pay

## 2019-01-13 ENCOUNTER — Ambulatory Visit: Payer: Medicare Other | Admitting: Family Medicine

## 2019-01-13 VITALS — BP 128/89 | HR 67 | Resp 16 | Ht 68.0 in | Wt 176.4 lb

## 2019-01-13 DIAGNOSIS — Z1239 Encounter for other screening for malignant neoplasm of breast: Secondary | ICD-10-CM

## 2019-01-13 DIAGNOSIS — J301 Allergic rhinitis due to pollen: Secondary | ICD-10-CM | POA: Diagnosis not present

## 2019-01-13 DIAGNOSIS — H6983 Other specified disorders of Eustachian tube, bilateral: Secondary | ICD-10-CM | POA: Diagnosis not present

## 2019-01-13 DIAGNOSIS — H6121 Impacted cerumen, right ear: Secondary | ICD-10-CM

## 2019-01-13 MED ORDER — CARBAMIDE PEROXIDE 6.5 % OT SOLN: 5.0000 [drp] | Freq: Two times a day (BID) | OTIC | 0 refills | Status: DC

## 2019-01-13 NOTE — Progress Notes (Signed)
Date:  01/13/2019   Name:  Bianca Hawkins   DOB:  Jan 08, 1946   MRN:  [id number]   Chief Complaint: Ear Fullness (L and R ); Breast Problem (needs breast exam before mammogram ); and Allergies (post nasal drip )  Patient needs breast exam for upcoming breast exam.  Ear Fullness   There is pain in both ears. This is a chronic problem. The current episode started more than 1 year ago. The problem has been waxing and waning. There has been no fever. Pertinent negatives include no abdominal pain, coughing, diarrhea, ear discharge, headaches, hearing loss, neck pain, rash, rhinorrhea, sore throat or vomiting. She has tried nothing for the symptoms. The treatment provided moderate relief. There is no history of a chronic ear infection, hearing loss or a tympanostomy tube.    Review of Systems  Constitutional: Negative.  Negative for chills, fatigue, fever and unexpected weight change.  HENT: Negative for congestion, ear discharge, ear pain, hearing loss, rhinorrhea, sinus pressure, sneezing and sore throat.   Eyes: Negative for photophobia, pain, discharge, redness and itching.  Respiratory: Negative for cough, shortness of breath, wheezing and stridor.   Gastrointestinal: Negative for abdominal pain, blood in stool, constipation, diarrhea, nausea and vomiting.  Endocrine: Negative for cold intolerance, heat intolerance, polydipsia, polyphagia and polyuria.  Genitourinary: Negative for dysuria, flank pain, frequency, hematuria, menstrual problem, pelvic pain, urgency, vaginal bleeding and vaginal discharge.  Musculoskeletal: Negative for arthralgias, back pain, myalgias and neck pain.  Skin: Negative for rash.  Allergic/Immunologic: Negative for environmental allergies and food allergies.  Neurological: Negative for dizziness, weakness, light-headedness, numbness and headaches.  Hematological: Negative for adenopathy. Does not bruise/bleed easily.  Psychiatric/Behavioral: Negative for  dysphoric mood. The patient is not nervous/anxious.     Patient Active Problem List   Diagnosis Date Noted  . Pure hypercholesterolemia 09/24/2018  . History of biliary T-tube placement 01/22/2018  . Recurrent major depressive episodes ([place]) 01/22/2018  . Age-related osteoporosis without current pathological fracture 09/09/2017  . Hyperlipidemia due to type 2 diabetes mellitus ([place]) 09/09/2017  . Panic attacks 04/16/2017  . Hyperparathyroidism, primary ([place]) 03/01/2017  . Multinodular goiter 03/01/2017  . Essential hypertension 06/28/2016  . Chronic anxiety 06/28/2016  . Type 2 diabetes mellitus without complication, without long-term current use of insulin ([place]) 08/09/2015  . Familial multiple lipoprotein-type hyperlipidemia 08/09/2015  . Pain in joint 08/09/2015  . Encounter for general adult medical examination without abnormal findings 08/09/2015  . Chronic obstructive pulmonary disease (COPD) ([place]) 08/09/2015  . H/O: depression 08/09/2015  . Gastroesophageal reflux disease without esophagitis 08/09/2015  . Blood glucose elevated 08/09/2015  . Encounter for screening for osteoporosis 08/09/2015  . Need for vaccination 08/09/2015  . Chronic MEE (middle ear effusion) 08/09/2015  . 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 Eggs Or Egg-Derived Products   . Other     Pepper  . Sulfa Antibiotics     Past Surgical History:  Procedure Laterality Date  . BIOPSY BREAST     benign  . BREAST BIOPSY Right 2014   core with clips;benign per pt  . COLONOSCOPY  2008   cleared for 10 years/ occults- 07/22/2015- negative  . INNER EAR SURGERY     benign tumor behind eardrum  . PARATHYROID EXPLORATION     nodule removed  . THYROID EXPLORATION  04/2017   nodule removed    Social History   Tobacco Use  . Smoking status: Former Smoker  Packs/day: 0.50    Years: 30.00    Pack years: 15.00    Types: Cigarettes    Last attempt to quit: 2003     Years since quitting: 17.1  . Smokeless tobacco: Never Used  Substance Use Topics  . Alcohol use: No    Alcohol/week: 0.0 standard drinks  . Drug use: No     Medication list has been reviewed and updated.  Current Meds  Medication Sig  . ACCU-CHEK AVIVA PLUS test strip USE AS DIRECTED  . aspirin 81 MG tablet Take 1 tablet by mouth daily.  . cetirizine (ZYRTEC) 5 MG tablet Take 1 tablet by mouth as needed.  . Cholecalciferol (VITAMIN D-1000 MAX ST) 1000 units tablet Take 1,000 mg by mouth daily.  . fluticasone (FLONASE) 50 MCG/ACT nasal spray Place 1 spray into both nostrils as needed.  Marland Kitchen glipiZIDE (GLUCOTROL XL) 5 MG 24 hr tablet   . linagliptin (TRADJENTA) 5 MG TABS tablet Take 5 mg by mouth daily.  . metFORMIN (GLUCOPHAGE) 1000 MG tablet Take 1 tablet (1,000 mg total) by mouth 2 (two) times daily. (Patient taking differently: Take 1,000 mg by mouth 2 (two) times daily. Dr Honor Junes)  . metoprolol tartrate (LOPRESSOR) 50 MG tablet Take 1 tablet (50 mg total) by mouth 2 (two) times daily.  Marland Kitchen omeprazole (PRILOSEC) 20 MG capsule TAKE 1 CAPSULE EVERY DAY  . PARoxetine (PAXIL) 10 MG tablet Take 1 tablet (10 mg total) by mouth daily.  . ramipril (ALTACE) 5 MG capsule TAKE 1 CAPSULE EVERY DAY    PHQ 2/9 Scores 01/13/2019 10/13/2018 09/24/2018 10/10/2017  PHQ - 2 Score 0 0 0 0  PHQ- 9 Score 0 0 0 -    Physical Exam Vitals signs and nursing note reviewed.  Constitutional:      General: She is not in acute distress.    Appearance: She is not ill-appearing, toxic-appearing or diaphoretic.  HENT:     Head: Normocephalic and atraumatic.     Jaw: There is normal jaw occlusion.     Right Ear: Hearing, ear canal and external ear normal. There is impacted cerumen. Tympanic membrane is retracted.     Left Ear: Hearing, ear canal and external ear normal. There is no impacted cerumen. Tympanic membrane is retracted.     Nose: Nose normal. No nasal deformity.     Right Turbinates: Not  enlarged or swollen.     Left Turbinates: Not enlarged or swollen.     Mouth/Throat:     Lips: Pink.     Mouth: Mucous membranes are moist.  Eyes:     General:        Right eye: No discharge.        Left eye: No discharge.     Conjunctiva/sclera: Conjunctivae normal.     Pupils: Pupils are equal, round, and reactive to light.  Neck:     Musculoskeletal: Normal range of motion and neck supple. No neck rigidity.     Thyroid: No thyromegaly.     Vascular: No JVD.  Cardiovascular:     Rate and Rhythm: Normal rate and regular rhythm.     Heart sounds: Normal heart sounds, S1 normal and S2 normal. No murmur. No systolic murmur. No diastolic murmur. No friction rub. No gallop. No S3 or S4 sounds.   Pulmonary:     Effort: Pulmonary effort is normal.     Breath sounds: Normal breath sounds.  Chest:     Chest wall: No mass.  Breasts: Breasts are symmetrical.        Right: Normal. No swelling, bleeding, inverted nipple, mass, nipple discharge, skin change or tenderness.        Left: Normal. No swelling, bleeding, inverted nipple, mass, nipple discharge, skin change or tenderness.  Abdominal:     General: Bowel sounds are normal.     Palpations: Abdomen is soft. There is no mass.     Tenderness: There is no abdominal tenderness. There is no guarding.  Musculoskeletal: Normal range of motion.     Right lower leg: No edema.     Left lower leg: No edema.  Lymphadenopathy:     Cervical: No cervical adenopathy.     Upper Body:     Right upper body: No supraclavicular or axillary adenopathy.     Left upper body: No supraclavicular or axillary adenopathy.  Skin:    General: Skin is warm and dry.  Neurological:     Mental Status: She is alert.     Deep Tendon Reflexes: Reflexes are normal and symmetric.     BP 128/89   Pulse 67   Resp 16   Ht 5\' 8"  (1.727 m)   Wt 176 lb 6.4 oz (80 kg)   SpO2 98%   BMI 26.82 kg/m   Assessment and Plan: 1. Breast cancer screening And undergoes  exam for any palpable concerns.  There was no area of concern noted over an area that experienced bruising back in December.  She was scheduled for digital screening mammogram in the future. - MM Digital Screening; Future  2. Impacted cerumen of right ear And has mild cerumen impaction in the right ear.  Debrox was prescribed patient was instructed on use of a bulb syringe with warm water to help extricate women. - carbamide peroxide (DEBROX) 6.5 % OTIC solution; Place 5 drops into the right ear 2 (two) times daily.  Dispense: 15 mL; Refill: 0  3. Eustachian tube dysfunction, bilateral Patient has awoken with eustachian tube concerns and ear popping told patient this is actually good that the ears seem to be equalizing pressure however patient still has concerned will initiate Afrin 24 hours prior to flying.  As well as taking fluticasone.  4. Seasonal allergic rhinitis due to pollen Patient has also concerned about persistent allergic rhinitis due to likely seasonal allergies to pollen.  Appointment was made with Dr. Donneta Romberg for allergy testing and if necessary desensitization. - Ambulatory referral to Allergy

## 2019-01-20 ENCOUNTER — Ambulatory Visit: Payer: Medicare Other

## 2019-01-27 ENCOUNTER — Ambulatory Visit
Admission: RE | Admit: 2019-01-27 | Discharge: 2019-01-27 | Disposition: A | Discharge status: Home / Self Care | Payer: Medicare Other | Source: Ambulatory Visit | Attending: Family Medicine | Admitting: Family Medicine

## 2019-01-27 ENCOUNTER — Other Ambulatory Visit: Payer: Self-pay

## 2019-01-27 DIAGNOSIS — Z1231 Encounter for screening mammogram for malignant neoplasm of breast: Secondary | ICD-10-CM

## 2019-02-20 ENCOUNTER — Encounter: Payer: Self-pay | Admitting: Family Medicine

## 2019-02-24 ENCOUNTER — Ambulatory Visit
Admission: RE | Admit: 2019-02-24 | Discharge: 2019-02-24 | Disposition: A | Discharge status: Home / Self Care | Payer: Medicare Other | Source: Ambulatory Visit | Attending: Family Medicine | Admitting: Family Medicine

## 2019-02-24 ENCOUNTER — Other Ambulatory Visit: Payer: Self-pay

## 2019-02-24 ENCOUNTER — Ambulatory Visit
Admission: RE | Admit: 2019-02-24 | Discharge: 2019-02-24 | Disposition: A | Discharge status: Home / Self Care | Payer: Medicare Other | Attending: Family Medicine | Admitting: Family Medicine

## 2019-02-24 ENCOUNTER — Ambulatory Visit: Payer: Medicare Other | Admitting: Family Medicine

## 2019-02-24 ENCOUNTER — Encounter: Payer: Self-pay | Admitting: Family Medicine

## 2019-02-24 VITALS — BP 130/78 | HR 60 | Ht 68.0 in | Wt 185.0 lb

## 2019-02-24 DIAGNOSIS — M79662 Pain in left lower leg: Secondary | ICD-10-CM | POA: Diagnosis present

## 2019-02-24 DIAGNOSIS — M25562 Pain in left knee: Secondary | ICD-10-CM

## 2019-02-24 MED ORDER — MELOXICAM 7.5 MG PO TABS: 7.5000 mg | ORAL_TABLET | Freq: Every day | ORAL | 0 refills | Status: DC

## 2019-02-24 NOTE — Progress Notes (Signed)
Date:  02/24/2019   Name:  Bianca Hawkins   DOB:  1946-02-19   MRN:  [id number]   Chief Complaint: Knee Pain (twisted L) leg before going to [place] on March 4. Has since had pain in knee and pain runs down "shin when I stand on it" Throbbing has subsided. No swelling, no redness)  Knee Pain   The incident occurred more than 1 week ago. The injury mechanism was a twisting injury. The pain is present in the left knee. The quality of the pain is described as aching. The pain is at a severity of 2/10. The pain is mild. The pain has been intermittent since onset. Pertinent negatives include no inability to bear weight, loss of motion, loss of sensation, muscle weakness, numbness or tingling. She reports no foreign bodies present. Nothing aggravates the symptoms. She has tried acetaminophen for the symptoms. The treatment provided moderate relief.    Review of Systems  Constitutional: Negative.  Negative for chills, fatigue, fever and unexpected weight change.  HENT: Negative for congestion, ear discharge, ear pain, rhinorrhea, sinus pressure, sneezing and sore throat.   Eyes: Negative for photophobia, pain, discharge, redness and itching.  Respiratory: Negative for cough, shortness of breath, wheezing and stridor.   Cardiovascular: Negative for chest pain, palpitations and leg swelling.  Gastrointestinal: Negative for abdominal pain, blood in stool, constipation, diarrhea, nausea and vomiting.  Endocrine: Negative for cold intolerance, heat intolerance, polydipsia, polyphagia and polyuria.  Genitourinary: Negative for dysuria, flank pain, frequency, hematuria, menstrual problem, pelvic pain, urgency, vaginal bleeding and vaginal discharge.  Musculoskeletal: Positive for arthralgias. Negative for back pain and myalgias.  Skin: Negative for rash.  Allergic/Immunologic: Negative for environmental allergies and food allergies.  Neurological: Negative for dizziness, tingling, weakness,  light-headedness, numbness and headaches.  Hematological: Negative for adenopathy. Does not bruise/bleed easily.  Psychiatric/Behavioral: Negative for dysphoric mood. The patient is not nervous/anxious.     Patient Active Problem List   Diagnosis Date Noted  . Pure hypercholesterolemia 09/24/2018  . History of biliary T-tube placement 01/22/2018  . Recurrent major depressive episodes ([place]) 01/22/2018  . Age-related osteoporosis without current pathological fracture 09/09/2017  . Hyperlipidemia due to type 2 diabetes mellitus ([place]) 09/09/2017  . Panic attacks 04/16/2017  . Hyperparathyroidism, primary ([place]) 03/01/2017  . Multinodular goiter 03/01/2017  . Essential hypertension 06/28/2016  . Chronic anxiety 06/28/2016  . Type 2 diabetes mellitus without complication, without long-term current use of insulin ([place]) 08/09/2015  . Familial multiple lipoprotein-type hyperlipidemia 08/09/2015  . Pain in joint 08/09/2015  . Encounter for general adult medical examination without abnormal findings 08/09/2015  . Chronic obstructive pulmonary disease (COPD) ([place]) 08/09/2015  . H/O: depression 08/09/2015  . Gastroesophageal reflux disease without esophagitis 08/09/2015  . Blood glucose elevated 08/09/2015  . Encounter for screening for osteoporosis 08/09/2015  . Need for vaccination 08/09/2015  . Chronic MEE (middle ear effusion) 08/09/2015  . 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 Eggs Or Egg-Derived Products   . Other     Pepper  . Sulfa Antibiotics     Past Surgical History:  Procedure Laterality Date  . BIOPSY BREAST     benign  . BREAST BIOPSY Right 2014   core with clips;benign per pt  . COLONOSCOPY  2008   cleared for 10 years/ occults- 07/22/2015- negative  . INNER EAR SURGERY     benign tumor behind eardrum  . PARATHYROID EXPLORATION  nodule removed  . THYROID EXPLORATION  04/2017   nodule removed    Social History    Tobacco Use  . Smoking status: Former Smoker    Packs/day: 0.50    Years: 30.00    Pack years: 15.00    Types: Cigarettes    Last attempt to quit: 2003    Years since quitting: 17.2  . Smokeless tobacco: Never Used  Substance Use Topics  . Alcohol use: No    Alcohol/week: 0.0 standard drinks  . Drug use: No     Medication list has been reviewed and updated.  Current Meds  Medication Sig  . ACCU-CHEK AVIVA PLUS test strip USE AS DIRECTED  . aspirin 81 MG tablet Take 1 tablet by mouth daily.  . cetirizine (ZYRTEC) 5 MG tablet Take 1 tablet by mouth as needed.  . Cholecalciferol (VITAMIN D-1000 MAX ST) 1000 units tablet Take 1,000 mg by mouth daily.  . fluticasone (FLONASE) 50 MCG/ACT nasal spray Place 1 spray into both nostrils as needed.  Marland Kitchen glipiZIDE (GLUCOTROL XL) 5 MG 24 hr tablet Dr Honor Junes  . linagliptin (TRADJENTA) 5 MG TABS tablet Take 5 mg by mouth daily. Dr Honor Junes  . metFORMIN (GLUCOPHAGE) 1000 MG tablet Take 1 tablet (1,000 mg total) by mouth 2 (two) times daily. (Patient taking differently: Take 1,000 mg by mouth 2 (two) times daily. Dr Honor Junes)  . metoprolol tartrate (LOPRESSOR) 50 MG tablet Take 1 tablet (50 mg total) by mouth 2 (two) times daily.  Marland Kitchen omeprazole (PRILOSEC) 20 MG capsule TAKE 1 CAPSULE EVERY DAY  . PARoxetine (PAXIL) 10 MG tablet Take 1 tablet (10 mg total) by mouth daily.  . ramipril (ALTACE) 5 MG capsule TAKE 1 CAPSULE EVERY DAY  . simvastatin (ZOCOR) 20 MG tablet Take 1 tablet (20 mg total) by mouth daily.    PHQ 2/9 Scores 01/13/2019 10/13/2018 09/24/2018 10/10/2017  PHQ - 2 Score 0 0 0 0  PHQ- 9 Score 0 0 0 -    BP Readings from Last 3 Encounters:  02/24/19 130/78  01/13/19 128/89  10/13/18 140/84    Physical Exam Vitals signs and nursing note reviewed.  Constitutional:      General: She is not in acute distress.    Appearance: Normal appearance. She is not diaphoretic.  HENT:     Head: Normocephalic and atraumatic.     Right  Ear: External ear normal.     Left Ear: External ear normal.     Nose: Nose normal.  Eyes:     General:        Right eye: No discharge.        Left eye: No discharge.     Conjunctiva/sclera: Conjunctivae normal.     Pupils: Pupils are equal, round, and reactive to light.  Neck:     Musculoskeletal: Normal range of motion and neck supple.     Thyroid: No thyromegaly.     Vascular: No JVD.  Cardiovascular:     Rate and Rhythm: Normal rate and regular rhythm.     Heart sounds: Normal heart sounds. No murmur. No friction rub. No gallop.   Pulmonary:     Effort: Pulmonary effort is normal. No respiratory distress.     Breath sounds: Normal breath sounds. No stridor. No wheezing, rhonchi or rales.  Abdominal:     General: Bowel sounds are normal.     Palpations: Abdomen is soft. There is no mass.     Tenderness: There is no abdominal tenderness.  There is no guarding.  Musculoskeletal: Normal range of motion.     Left knee: She exhibits bony tenderness. She exhibits normal range of motion, no swelling, no effusion, no ecchymosis, no deformity, no erythema, no LCL laxity, normal patellar mobility and no MCL laxity. Tenderness found. Lateral joint line tenderness noted. No medial joint line, no MCL, no LCL and no patellar tendon tenderness noted.     Comments: Tender proximal fibula  Lymphadenopathy:     Cervical: No cervical adenopathy.  Skin:    General: Skin is warm and dry.  Neurological:     Mental Status: She is alert.     Deep Tendon Reflexes: Reflexes are normal and symmetric.     Wt Readings from Last 3 Encounters:  02/24/19 185 lb (83.9 kg)  01/13/19 176 lb 6.4 oz (80 kg)  10/13/18 176 lb 6.4 oz (80 kg)    BP 130/78   Pulse 60   Ht 5\' 8"  (1.727 m)   Wt 185 lb (83.9 kg)   BMI 28.13 kg/m   Assessment and Plan: 1. 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   Patient does not have any swelling or exquisite weight line tenderness.  There is no loss of range of motion, no extension block, no laxity.  Initiate meloxicam 7.5 once a day - meloxicam (MOBIC) 7.5 MG tablet; Take 1 tablet (7.5 mg total) by mouth daily.  Dispense: 30 tablet; Refill: 0  2. Pain of left lower leg Chronic pain is extended down the left fibula with tenderness over the fibular head patient underwent x-ray which showed no fracture. - DG Tibia/Fibula Left; Future

## 2019-04-21 ENCOUNTER — Other Ambulatory Visit: Payer: Self-pay | Admitting: Family Medicine

## 2019-04-21 DIAGNOSIS — K219 Gastro-esophageal reflux disease without esophagitis: Secondary | ICD-10-CM

## 2019-04-21 DIAGNOSIS — F419 Anxiety disorder, unspecified: Secondary | ICD-10-CM

## 2019-04-21 DIAGNOSIS — Z8659 Personal history of other mental and behavioral disorders: Secondary | ICD-10-CM

## 2019-04-21 DIAGNOSIS — E7800 Pure hypercholesterolemia, unspecified: Secondary | ICD-10-CM

## 2019-05-20 LAB — HEMOGLOBIN A1C: Hemoglobin A1C: 6.9

## 2019-06-08 ENCOUNTER — Other Ambulatory Visit: Payer: Self-pay

## 2019-06-08 ENCOUNTER — Ambulatory Visit (INDEPENDENT_AMBULATORY_CARE_PROVIDER_SITE_OTHER): Payer: Medicare Other | Admitting: Family Medicine

## 2019-06-08 ENCOUNTER — Encounter: Payer: Self-pay | Admitting: Family Medicine

## 2019-06-08 VITALS — BP 150/90 | HR 88 | Ht 68.0 in | Wt 177.0 lb

## 2019-06-08 DIAGNOSIS — I10 Essential (primary) hypertension: Secondary | ICD-10-CM | POA: Diagnosis not present

## 2019-06-08 DIAGNOSIS — F419 Anxiety disorder, unspecified: Secondary | ICD-10-CM | POA: Diagnosis not present

## 2019-06-08 MED ORDER — BUSPIRONE HCL 5 MG PO TABS: 5.0000 mg | ORAL_TABLET | Freq: Two times a day (BID) | ORAL | 1 refills | Status: DC

## 2019-06-08 MED ORDER — METOPROLOL TARTRATE 100 MG PO TABS: 100.0000 mg | ORAL_TABLET | Freq: Two times a day (BID) | ORAL | 1 refills | Status: DC

## 2019-06-08 NOTE — Progress Notes (Addendum)
Date:  06/08/2019   Name:  Bianca Hawkins   DOB:  03-Apr-1946   MRN:  [id number]   Chief Complaint: Hypertension (increased ramipril to 10mg  daily and metoprolol to 100mg  bid- pt is still having readings )  Hypertension This is a new problem. The current episode started more than 1 year ago. The problem has been gradually worsening since onset. The problem is controlled. Pertinent negatives include no anxiety, blurred vision, chest pain, headaches, malaise/fatigue, neck pain, orthopnea, palpitations, peripheral edema, PND, shortness of breath or sweats. There are no associated agents to hypertension. Past treatments include ACE inhibitors and beta blockers. The current treatment provides no improvement. There is no history of angina, kidney disease, CAD/MI, CVA, heart failure, left ventricular hypertrophy, PVD or retinopathy. There is no history of chronic renal disease, a hypertension causing med or renovascular disease.    Review of Systems  Constitutional: Negative.  Negative for chills, fatigue, fever, malaise/fatigue and unexpected weight change.  HENT: Negative for congestion, ear discharge, ear pain, rhinorrhea, sinus pressure, sneezing and sore throat.   Eyes: Negative for blurred vision, photophobia, pain, discharge, redness and itching.  Respiratory: Negative for cough, shortness of breath, wheezing and stridor.   Cardiovascular: Negative for chest pain, palpitations, orthopnea and PND.  Gastrointestinal: Negative for abdominal pain, blood in stool, constipation, diarrhea, nausea and vomiting.  Endocrine: Negative for cold intolerance, heat intolerance, polydipsia, polyphagia and polyuria.  Genitourinary: Negative for dysuria, flank pain, frequency, hematuria, menstrual problem, pelvic pain, urgency, vaginal bleeding and vaginal discharge.  Musculoskeletal: Negative for arthralgias, back pain, myalgias and neck pain.  Skin: Negative for rash.  Allergic/Immunologic: Negative  for environmental allergies and food allergies.  Neurological: Negative for dizziness, weakness, light-headedness, numbness and headaches.  Hematological: Negative for adenopathy. Does not bruise/bleed easily.  Psychiatric/Behavioral: Negative for dysphoric mood. The patient is not nervous/anxious.     Patient Active Problem List   Diagnosis Date Noted  . Pure hypercholesterolemia 09/24/2018  . History of biliary T-tube placement 01/22/2018  . Recurrent major depressive episodes ([place]) 01/22/2018  . Age-related osteoporosis without current pathological fracture 09/09/2017  . Hyperlipidemia due to type 2 diabetes mellitus ([place]) 09/09/2017  . Panic attacks 04/16/2017  . Hyperparathyroidism, primary ([place]) 03/01/2017  . Multinodular goiter 03/01/2017  . Essential hypertension 06/28/2016  . Chronic anxiety 06/28/2016  . Type 2 diabetes mellitus without complication, without long-term current use of insulin ([place]) 08/09/2015  . Familial multiple lipoprotein-type hyperlipidemia 08/09/2015  . Pain in joint 08/09/2015  . Encounter for general adult medical examination without abnormal findings 08/09/2015  . Chronic obstructive pulmonary disease (COPD) ([place]) 08/09/2015  . H/O: depression 08/09/2015  . Gastroesophageal reflux disease without esophagitis 08/09/2015  . Blood glucose elevated 08/09/2015  . Encounter for screening for osteoporosis 08/09/2015  . Need for vaccination 08/09/2015  . Chronic MEE (middle ear effusion) 08/09/2015  . 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 Eggs Or Egg-Derived Products   . Other     Pepper  . Sulfa Antibiotics     Past Surgical History:  Procedure Laterality Date  . BIOPSY BREAST     benign  . BREAST BIOPSY Right 2014   core with clips;benign per pt  . COLONOSCOPY  2008   cleared for 10 years/ occults- 07/22/2015- negative  . INNER EAR SURGERY     benign tumor behind eardrum  . PARATHYROID EXPLORATION      nodule removed  . THYROID EXPLORATION  04/2017   nodule removed    Social History   Tobacco Use  . Smoking status: Former Smoker    Packs/day: 0.50    Years: 30.00    Pack years: 15.00    Types: Cigarettes    Quit date: 2003    Years since quitting: 17.5  . Smokeless tobacco: Never Used  Substance Use Topics  . Alcohol use: No    Alcohol/week: 0.0 standard drinks  . Drug use: No     Medication list has been reviewed and updated.  Current Meds  Medication Sig  . ACCU-CHEK AVIVA PLUS test strip USE AS DIRECTED  . aspirin 81 MG tablet Take 1 tablet by mouth daily.  . carbamide peroxide (DEBROX) 6.5 % OTIC solution Place 5 drops into the right ear 2 (two) times daily.  . cetirizine (ZYRTEC) 5 MG tablet Take 1 tablet by mouth as needed.  . Cholecalciferol (VITAMIN D-1000 MAX ST) 1000 units tablet Take 1,000 mg by mouth daily.  . fluticasone (FLONASE) 50 MCG/ACT nasal spray Place 1 spray into both nostrils as needed.  Marland Kitchen glipiZIDE (GLUCOTROL XL) 5 MG 24 hr tablet Dr Honor Junes  . linagliptin (TRADJENTA) 5 MG TABS tablet Take 5 mg by mouth daily. Dr Honor Junes  . meloxicam (MOBIC) 7.5 MG tablet Take 1 tablet (7.5 mg total) by mouth daily.  . metFORMIN (GLUCOPHAGE) 1000 MG tablet Take 1 tablet (1,000 mg total) by mouth 2 (two) times daily. (Patient taking differently: Take 1,000 mg by mouth 2 (two) times daily. Dr Honor Junes)  . metoprolol tartrate (LOPRESSOR) 50 MG tablet Take 1 tablet (50 mg total) by mouth 2 (two) times daily. (Patient taking differently: Take 100 mg by mouth 2 (two) times daily. )  . omeprazole (PRILOSEC) 20 MG capsule TAKE 1 CAPSULE BY MOUTH DAILY  . PARoxetine (PAXIL) 10 MG tablet TAKE 1 TABLET BY MOUTH DAILY  . ramipril (ALTACE) 10 MG capsule Take 1 capsule by mouth daily.  . simvastatin (ZOCOR) 20 MG tablet TAKE 1 TABLET BY MOUTH ONCE DAILY    PHQ 2/9 Scores 01/13/2019 10/13/2018 09/24/2018 10/10/2017  PHQ - 2 Score 0 0 0 0  PHQ- 9 Score 0 0 0 -    BP  Readings from Last 3 Encounters:  06/08/19 (!) 164/92  02/24/19 130/78  01/13/19 128/89    Physical Exam Vitals signs and nursing note reviewed.  Constitutional:      Appearance: She is well-developed.  HENT:     Head: Normocephalic.     Right Ear: Tympanic membrane, ear canal and external ear normal.     Left Ear: Tympanic membrane, ear canal and external ear normal.     Nose: Nose normal.     Mouth/Throat:     Mouth: Mucous membranes are moist.  Eyes:     General: Lids are everted, no foreign bodies appreciated. No scleral icterus.       Left eye: No foreign body or hordeolum.     Conjunctiva/sclera: Conjunctivae normal.     Right eye: Right conjunctiva is not injected.     Left eye: Left conjunctiva is not injected.     Pupils: Pupils are equal, round, and reactive to light.  Neck:     Musculoskeletal: Normal range of motion and neck supple.     Thyroid: No thyromegaly.     Vascular: No JVD.     Trachea: No tracheal deviation.  Cardiovascular:     Rate and Rhythm: Normal rate and regular rhythm.  Heart sounds: Normal heart sounds. No murmur. No friction rub. No gallop.   Pulmonary:     Effort: Pulmonary effort is normal. No respiratory distress.     Breath sounds: Normal breath sounds. No wheezing, rhonchi or rales.  Abdominal:     General: Bowel sounds are normal.     Palpations: Abdomen is soft. There is no mass.     Tenderness: There is no abdominal tenderness. There is no guarding or rebound.  Musculoskeletal: Normal range of motion.        General: No tenderness.  Lymphadenopathy:     Cervical: No cervical adenopathy.  Skin:    General: Skin is warm.     Findings: No rash.  Neurological:     Mental Status: She is alert and oriented to person, place, and time.     Cranial Nerves: No cranial nerve deficit.     Deep Tendon Reflexes: Reflexes normal.  Psychiatric:        Mood and Affect: Mood is not anxious or depressed.     Wt Readings from Last 3  Encounters:  06/08/19 177 lb (80.3 kg)  02/24/19 185 lb (83.9 kg)  01/13/19 176 lb 6.4 oz (80 kg)    BP (!) 164/92   Pulse 88   Ht 5\' 8"  (1.727 m)   Wt 177 lb (80.3 kg)   BMI 26.91 kg/m   Assessment and Plan: 1. Essential hypertension Chronic.  Controlled.  Continue metoprolol 100 mg twice a day. - metoprolol tartrate (LOPRESSOR) 100 MG tablet; Take 1 tablet (100 mg total) by mouth 2 (two) times daily.  Dispense: 180 tablet; Refill: 1  2. Chronic anxiety Chronic.  Moderately controlled.  Will hold on buspirone.

## 2019-06-11 ENCOUNTER — Telehealth: Payer: Self-pay

## 2019-06-11 NOTE — Telephone Encounter (Signed)
Pt called c/o feeling "whoozy"- wanted to know if it was the new medicine she had started. She is feeling better today and B/P is 151/71. Was told to drink plenty of fluids to stay hydrated. She is going to monitor and let us know if anything changes for the worse

## 2019-07-06 ENCOUNTER — Other Ambulatory Visit: Payer: Self-pay | Admitting: Family Medicine

## 2019-07-06 DIAGNOSIS — F419 Anxiety disorder, unspecified: Secondary | ICD-10-CM

## 2019-07-21 ENCOUNTER — Other Ambulatory Visit: Payer: Self-pay

## 2019-07-21 ENCOUNTER — Encounter: Payer: Self-pay | Admitting: Family Medicine

## 2019-07-21 ENCOUNTER — Ambulatory Visit: Payer: Medicare Other | Admitting: Family Medicine

## 2019-07-21 VITALS — BP 152/84 | HR 64 | Ht 68.0 in | Wt 177.0 lb

## 2019-07-21 DIAGNOSIS — F419 Anxiety disorder, unspecified: Secondary | ICD-10-CM

## 2019-07-21 DIAGNOSIS — I10 Essential (primary) hypertension: Secondary | ICD-10-CM | POA: Diagnosis not present

## 2019-07-21 DIAGNOSIS — F329 Major depressive disorder, single episode, unspecified: Secondary | ICD-10-CM | POA: Diagnosis not present

## 2019-07-21 MED ORDER — PAROXETINE HCL 10 MG PO TABS: 10.0000 mg | ORAL_TABLET | Freq: Every day | ORAL | 1 refills | Status: DC

## 2019-07-21 MED ORDER — RAMIPRIL 10 MG PO CAPS: 10.0000 mg | ORAL_CAPSULE | Freq: Every day | ORAL | 1 refills | Status: DC

## 2019-07-21 MED ORDER — BUSPIRONE HCL 5 MG PO TABS: 5.0000 mg | ORAL_TABLET | Freq: Two times a day (BID) | ORAL | 0 refills | Status: DC

## 2019-07-21 MED ORDER — METOPROLOL TARTRATE 100 MG PO TABS: 100.0000 mg | ORAL_TABLET | Freq: Two times a day (BID) | ORAL | 1 refills | Status: DC

## 2019-07-21 MED ORDER — HYDROCHLOROTHIAZIDE 12.5 MG PO CAPS: 12.5000 mg | ORAL_CAPSULE | Freq: Every day | ORAL | 0 refills | Status: DC

## 2019-07-21 NOTE — Progress Notes (Signed)
Date:  07/21/2019   Name:  Bianca Hawkins   DOB:  September 24, 1946   MRN:  [id number]   Chief Complaint: Follow-up (depression and anxiety- started on buspirone)  Anxiety Presents for follow-up visit. Symptoms include excessive worry and nervous/anxious behavior. Patient reports no chest pain, compulsions, confusion, decreased concentration, depressed mood, dizziness, dry mouth, feeling of choking, hyperventilation, impotence, insomnia, irritability, malaise, muscle tension, nausea, obsessions, palpitations, panic, restlessness, shortness of breath or suicidal ideas. Symptoms occur most days. The severity of symptoms is moderate.    Hypertension This is a chronic problem. The current episode started more than 1 year ago. The problem has been gradually worsening since onset. The problem is uncontrolled. Associated symptoms include anxiety. Pertinent negatives include no blurred vision, chest pain, headaches, malaise/fatigue, neck pain, orthopnea, palpitations, peripheral edema, PND or shortness of breath. There are no associated agents to hypertension. Past treatments include ACE inhibitors and beta blockers. The current treatment provides moderate improvement. There are no compliance problems.  There is no history of angina, kidney disease, CAD/MI, CVA, heart failure, left ventricular hypertrophy, PVD or retinopathy. There is no history of chronic renal disease, a hypertension causing med or renovascular disease.  Depression        This is a chronic problem.  The current episode started more than 1 year ago.   The problem occurs intermittently.  Associated symptoms include no decreased concentration, no fatigue, no helplessness, no hopelessness, does not have insomnia, not irritable, no restlessness, no decreased interest, no appetite change, no body aches, no myalgias, no headaches, no indigestion, not sad and no suicidal ideas.     The symptoms are aggravated by medication.  Past treatments  include SSRIs - Selective serotonin reuptake inhibitors.  Previous treatment provided mild relief.  Past medical history includes anxiety.     Review of Systems  Constitutional: Negative.  Negative for appetite change, chills, fatigue, fever, irritability, malaise/fatigue and unexpected weight change.  HENT: Negative for congestion, ear discharge, ear pain, postnasal drip, rhinorrhea, sinus pressure, sneezing and sore throat.   Eyes: Negative for blurred vision, photophobia, pain, discharge, redness and itching.  Respiratory: Negative for cough, shortness of breath, wheezing and stridor.   Cardiovascular: Negative for chest pain, palpitations, orthopnea and PND.  Gastrointestinal: Negative for abdominal pain, blood in stool, constipation, diarrhea, nausea and vomiting.  Endocrine: Negative for cold intolerance, heat intolerance, polydipsia, polyphagia and polyuria.  Genitourinary: Negative for dysuria, flank pain, frequency, hematuria, impotence, menstrual problem, pelvic pain, urgency, vaginal bleeding and vaginal discharge.  Musculoskeletal: Negative for arthralgias, back pain, myalgias and neck pain.  Skin: Negative for rash.  Allergic/Immunologic: Negative for environmental allergies and food allergies.  Neurological: Negative for dizziness, weakness, light-headedness, numbness and headaches.  Hematological: Negative for adenopathy. Does not bruise/bleed easily.  Psychiatric/Behavioral: Positive for depression. Negative for confusion, decreased concentration, dysphoric mood and suicidal ideas. The patient is nervous/anxious. The patient does not have insomnia.     Patient Active Problem List   Diagnosis Date Noted  . Pure hypercholesterolemia 09/24/2018  . History of biliary T-tube placement 01/22/2018  . Recurrent major depressive episodes ([place]) 01/22/2018  . Age-related osteoporosis without current pathological fracture 09/09/2017  . Hyperlipidemia due to type 2 diabetes mellitus  ([place]) 09/09/2017  . Panic attacks 04/16/2017  . Hyperparathyroidism, primary ([employer]) 03/01/2017  . Multinodular goiter 03/01/2017  . Essential hypertension 06/28/2016  . Chronic anxiety 06/28/2016  . Type 2 diabetes mellitus without complication, without long-term current use of insulin ([place]) 08/09/2015  .  Familial multiple lipoprotein-type hyperlipidemia 08/09/2015  . Pain in joint 08/09/2015  . Encounter for general adult medical examination without abnormal findings 08/09/2015  . Chronic obstructive pulmonary disease (COPD) ([place]) 08/09/2015  . H/O: depression 08/09/2015  . Gastroesophageal reflux disease without esophagitis 08/09/2015  . Blood glucose elevated 08/09/2015  . Encounter for screening for osteoporosis 08/09/2015  . Need for vaccination 08/09/2015  . Chronic MEE (middle ear effusion) 08/09/2015  . 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 Eggs Or Egg-Derived Products   . Other     Pepper  . Sulfa Antibiotics     Past Surgical History:  Procedure Laterality Date  . BIOPSY BREAST     benign  . BREAST BIOPSY Right 2014   core with clips;benign per pt  . COLONOSCOPY  2008   cleared for 10 years/ occults- 07/22/2015- negative  . INNER EAR SURGERY     benign tumor behind eardrum  . PARATHYROID EXPLORATION     nodule removed  . THYROID EXPLORATION  04/2017   nodule removed    Social History   Tobacco Use  . Smoking status: Former Smoker    Packs/day: 0.50    Years: 30.00    Pack years: 15.00    Types: Cigarettes    Quit date: 2003    Years since quitting: 17.6  . Smokeless tobacco: Never Used  Substance Use Topics  . Alcohol use: No    Alcohol/week: 0.0 standard drinks  . Drug use: No     Medication list has been reviewed and updated.  Current Meds  Medication Sig  . ACCU-CHEK AVIVA PLUS test strip USE AS DIRECTED  . aspirin 81 MG tablet Take 1 tablet by mouth daily.  . busPIRone (BUSPAR) 5 MG tablet TAKE ONE  TABLET TWICE DAILY  . carbamide peroxide (DEBROX) 6.5 % OTIC solution Place 5 drops into the right ear 2 (two) times daily.  . cetirizine (ZYRTEC) 5 MG tablet Take 1 tablet by mouth as needed.  . Cholecalciferol (VITAMIN D-1000 MAX ST) 1000 units tablet Take 1,000 mg by mouth daily.  . fluticasone (FLONASE) 50 MCG/ACT nasal spray Place 1 spray into both nostrils as needed.  Marland Kitchen glipiZIDE (GLUCOTROL XL) 5 MG 24 hr tablet Take 2.5 mg by mouth daily with breakfast. Dr Honor Junes  . linagliptin (TRADJENTA) 5 MG TABS tablet Take 5 mg by mouth daily. Dr Honor Junes  . metFORMIN (GLUCOPHAGE) 1000 MG tablet Take 1 tablet (1,000 mg total) by mouth 2 (two) times daily. (Patient taking differently: Take 1,000 mg by mouth 2 (two) times daily. Dr Honor Junes)  . metoprolol tartrate (LOPRESSOR) 100 MG tablet Take 1 tablet (100 mg total) by mouth 2 (two) times daily.  Marland Kitchen omeprazole (PRILOSEC) 20 MG capsule TAKE 1 CAPSULE BY MOUTH DAILY  . PARoxetine (PAXIL) 10 MG tablet TAKE 1 TABLET BY MOUTH DAILY  . ramipril (ALTACE) 10 MG capsule Take 1 capsule by mouth daily.  . simvastatin (ZOCOR) 20 MG tablet TAKE 1 TABLET BY MOUTH ONCE DAILY    PHQ 2/9 Scores 07/21/2019 01/13/2019 10/13/2018 09/24/2018  PHQ - 2 Score 0 0 0 0  PHQ- 9 Score 0 0 0 0    BP Readings from Last 3 Encounters:  07/21/19 (!) 160/82  06/08/19 (!) 150/90  02/24/19 130/78    Physical Exam Vitals signs and nursing note reviewed.  Constitutional:      General: She is not irritable.She is not in acute distress.  Appearance: She is not diaphoretic.  HENT:     Head: Normocephalic and atraumatic.     Right Ear: Tympanic membrane, ear canal and external ear normal. There is no impacted cerumen.     Left Ear: Tympanic membrane, ear canal and external ear normal. There is no impacted cerumen.     Nose: Nose normal. No congestion or rhinorrhea.     Mouth/Throat:     Mouth: Mucous membranes are moist.  Eyes:     General:        Right eye: No discharge.         Left eye: No discharge.     Conjunctiva/sclera: Conjunctivae normal.     Pupils: Pupils are equal, round, and reactive to light.  Neck:     Musculoskeletal: Normal range of motion and neck supple.     Thyroid: No thyromegaly.     Vascular: No JVD.  Cardiovascular:     Rate and Rhythm: Normal rate and regular rhythm.     Heart sounds: Normal heart sounds. No murmur. No friction rub. No gallop.   Pulmonary:     Effort: Pulmonary effort is normal.     Breath sounds: Normal breath sounds. No wheezing, rhonchi or rales.  Abdominal:     General: Bowel sounds are normal.     Palpations: Abdomen is soft. There is no mass.     Tenderness: There is no abdominal tenderness. There is no guarding.  Musculoskeletal: Normal range of motion.  Lymphadenopathy:     Cervical: No cervical adenopathy.  Skin:    General: Skin is warm and dry.     Capillary Refill: Capillary refill takes less than 2 seconds.  Neurological:     General: No focal deficit present.     Mental Status: She is alert.     Deep Tendon Reflexes: Reflexes are normal and symmetric.     Wt Readings from Last 3 Encounters:  07/21/19 177 lb (80.3 kg)  06/08/19 177 lb (80.3 kg)  02/24/19 185 lb (83.9 kg)    BP (!) 160/82   Pulse 64   Ht 5\' 8"  (1.727 m)   Wt 177 lb (80.3 kg)   BMI 26.91 kg/m   Assessment and Plan:  1. Essential hypertension Chronic.  Controlled.  Continue hydrochlorothiazide 12.5 once a day, ramipril 10 mg once a day.  And metoprolol 100 mg 1 tablet twice a day. - hydrochlorothiazide (MICROZIDE) 12.5 MG capsule; Take 1 capsule (12.5 mg total) by mouth daily.  Dispense: 60 capsule; Refill: 0 - ramipril (ALTACE) 10 MG capsule; Take 1 capsule (10 mg total) by mouth daily.  Dispense: 90 capsule; Refill: 1 - metoprolol tartrate (LOPRESSOR) 100 MG tablet; Take 1 tablet (100 mg total) by mouth 2 (two) times daily.  Dispense: 180 tablet; Refill: 1  2. Chronic anxiety Gad score was noted to be 0 patient  is tolerating Paxil 10 mg once a day and given her reactive anxiety to neighborhood issues patient needs to stay on Paxil.  Patient will continue on BuSpar 5 mg 1 twice a day for her anxiety as well. - PARoxetine (PAXIL) 10 MG tablet; Take 1 tablet (10 mg total) by mouth daily.  Dispense: 90 tablet; Refill: 1 - busPIRone (BUSPAR) 5 MG tablet; Take 1 tablet (5 mg total) by mouth 2 (two) times daily.  Dispense: 180 tablet; Refill: 0  3. Reactive depression Chronic.  Controlled.  PHQ score 0 patient will continue Paxil 10 mg once a day.

## 2019-07-27 ENCOUNTER — Other Ambulatory Visit: Payer: Self-pay | Admitting: Family Medicine

## 2019-07-27 DIAGNOSIS — K219 Gastro-esophageal reflux disease without esophagitis: Secondary | ICD-10-CM

## 2019-07-27 DIAGNOSIS — E7800 Pure hypercholesterolemia, unspecified: Secondary | ICD-10-CM

## 2019-07-28 ENCOUNTER — Other Ambulatory Visit: Payer: Self-pay

## 2019-07-28 DIAGNOSIS — K219 Gastro-esophageal reflux disease without esophagitis: Secondary | ICD-10-CM

## 2019-07-28 DIAGNOSIS — I10 Essential (primary) hypertension: Secondary | ICD-10-CM

## 2019-07-28 DIAGNOSIS — E7800 Pure hypercholesterolemia, unspecified: Secondary | ICD-10-CM

## 2019-07-28 MED ORDER — OMEPRAZOLE 20 MG PO CPDR: DELAYED_RELEASE_CAPSULE | ORAL | 1 refills | Status: DC

## 2019-07-28 MED ORDER — SIMVASTATIN 20 MG PO TABS: 20.0000 mg | ORAL_TABLET | Freq: Every day | ORAL | 1 refills | Status: DC

## 2019-07-31 ENCOUNTER — Encounter: Payer: Self-pay | Admitting: Family Medicine

## 2019-08-21 ENCOUNTER — Other Ambulatory Visit: Payer: Self-pay

## 2019-08-21 ENCOUNTER — Other Ambulatory Visit: Payer: Self-pay | Admitting: Family Medicine

## 2019-08-21 ENCOUNTER — Telehealth: Payer: Self-pay

## 2019-08-21 DIAGNOSIS — F419 Anxiety disorder, unspecified: Secondary | ICD-10-CM

## 2019-08-21 NOTE — Telephone Encounter (Signed)
Pt called stating she is dehydrated and her b/p is 126/60- she wants to "stop her fluid pill" until she sees [place] in a couple of weeks. Gave her the okay to do so as she said she is "doing much better on her blood pressure"

## 2019-09-01 ENCOUNTER — Encounter: Payer: Self-pay | Admitting: Family Medicine

## 2019-09-01 ENCOUNTER — Other Ambulatory Visit
Admission: RE | Admit: 2019-09-01 | Discharge: 2019-09-01 | Disposition: A | Discharge status: Home / Self Care | Payer: Medicare Other | Attending: Family Medicine | Admitting: Family Medicine

## 2019-09-01 ENCOUNTER — Ambulatory Visit: Payer: Medicare Other | Admitting: Family Medicine

## 2019-09-01 ENCOUNTER — Other Ambulatory Visit: Payer: Self-pay

## 2019-09-01 VITALS — BP 122/83 | HR 74 | Resp 16 | Ht 68.0 in | Wt 178.0 lb

## 2019-09-01 DIAGNOSIS — D696 Thrombocytopenia, unspecified: Secondary | ICD-10-CM | POA: Diagnosis present

## 2019-09-01 DIAGNOSIS — F419 Anxiety disorder, unspecified: Secondary | ICD-10-CM | POA: Diagnosis not present

## 2019-09-01 DIAGNOSIS — E7800 Pure hypercholesterolemia, unspecified: Secondary | ICD-10-CM

## 2019-09-01 DIAGNOSIS — I10 Essential (primary) hypertension: Secondary | ICD-10-CM | POA: Diagnosis not present

## 2019-09-01 DIAGNOSIS — K219 Gastro-esophageal reflux disease without esophagitis: Secondary | ICD-10-CM | POA: Diagnosis not present

## 2019-09-01 LAB — CBC WITH DIFFERENTIAL/PLATELET
Abs Immature Granulocytes: 0.03 10*3/uL (ref 0.00–0.07)
Basophils Absolute: 0 10*3/uL (ref 0.0–0.1)
Basophils Relative: 1 %
Eosinophils Absolute: 0.3 10*3/uL (ref 0.0–0.5)
Eosinophils Relative: 4 %
HCT: 37 % (ref 36.0–46.0)
Hemoglobin: 12.2 g/dL (ref 12.0–15.0)
Immature Granulocytes: 0 %
Lymphocytes Relative: 27 %
Lymphs Abs: 2.1 10*3/uL (ref 0.7–4.0)
MCH: 28.8 pg (ref 26.0–34.0)
MCHC: 33 g/dL (ref 30.0–36.0)
MCV: 87.3 fL (ref 80.0–100.0)
Monocytes Absolute: 0.5 10*3/uL (ref 0.1–1.0)
Monocytes Relative: 7 %
Neutro Abs: 4.9 10*3/uL (ref 1.7–7.7)
Neutrophils Relative %: 61 %
Platelets: 135 10*3/uL — ABNORMAL LOW (ref 150–400)
RBC: 4.24 MIL/uL (ref 3.87–5.11)
RDW: 13.8 % (ref 11.5–15.5)
WBC: 7.8 10*3/uL (ref 4.0–10.5)
nRBC: 0 % (ref 0.0–0.2)

## 2019-09-01 MED ORDER — PAROXETINE HCL 10 MG PO TABS: 10.0000 mg | ORAL_TABLET | Freq: Every day | ORAL | 1 refills | Status: DC

## 2019-09-01 MED ORDER — SIMVASTATIN 20 MG PO TABS: 20.0000 mg | ORAL_TABLET | Freq: Every day | ORAL | 1 refills | Status: DC

## 2019-09-01 MED ORDER — METOPROLOL TARTRATE 100 MG PO TABS: 100.0000 mg | ORAL_TABLET | Freq: Two times a day (BID) | ORAL | 1 refills | Status: DC

## 2019-09-01 MED ORDER — OMEPRAZOLE 20 MG PO CPDR: DELAYED_RELEASE_CAPSULE | ORAL | 1 refills | Status: DC

## 2019-09-01 MED ORDER — BUSPIRONE HCL 5 MG PO TABS: 5.0000 mg | ORAL_TABLET | Freq: Two times a day (BID) | ORAL | 1 refills | Status: DC

## 2019-09-01 MED ORDER — RAMIPRIL 10 MG PO CAPS: 10.0000 mg | ORAL_CAPSULE | Freq: Every day | ORAL | 1 refills | Status: DC

## 2019-09-01 NOTE — Progress Notes (Signed)
Date:  09/01/2019   Name:  Bianca Hawkins   DOB:  [birth year]   MRN:  [id number]   Chief Complaint: hyperparathyroidism (TSH ), platelet  (ordered CBC ), and Hypertension  Hypertension This is a chronic problem. The current episode started more than 1 year ago. The problem has been gradually improving since onset. The problem is controlled. Pertinent negatives include no anxiety, blurred vision, chest pain, headaches, malaise/fatigue, neck pain, orthopnea, palpitations, peripheral edema, PND, shortness of breath or sweats. There are no associated agents to hypertension. There are no known risk factors for coronary artery disease. Past treatments include ACE inhibitors and beta blockers. The current treatment provides moderate improvement. There are no compliance problems.  There is no history of angina, kidney disease, CAD/MI, CVA, heart failure, left ventricular hypertrophy, PVD or retinopathy. There is no history of chronic renal disease, a hypertension causing med or renovascular disease.  Depression        This is a chronic problem.  The current episode started more than 1 year ago.   The onset quality is gradual.   Associated symptoms include no decreased concentration, no fatigue, no helplessness, no hopelessness, does not have insomnia, not irritable, no restlessness, no decreased interest, no appetite change, no body aches, no myalgias, no headaches, no indigestion, not sad and no suicidal ideas.     The symptoms are aggravated by nothing.  Past treatments include SSRIs - Selective serotonin reuptake inhibitors.  Compliance with treatment is good.  Previous treatment provided moderate relief.   Pertinent negatives include no anxiety. Anxiety Presents for follow-up visit. Symptoms include nervous/anxious behavior. Patient reports no chest pain, compulsions, confusion, decreased concentration, dizziness, excessive worry, feeling of choking, hyperventilation, impotence, insomnia,  irritability, nausea, obsessions, palpitations, panic, restlessness, shortness of breath or suicidal ideas. Symptoms occur occasionally. The severity of symptoms is mild.    Hyperlipidemia This is a chronic problem. The current episode started more than 1 year ago. The problem is controlled. Recent lipid tests were reviewed and are normal. Exacerbating diseases include diabetes. She has no history of chronic renal disease. Pertinent negatives include no chest pain, focal sensory loss, focal weakness, leg pain, myalgias or shortness of breath. Current antihyperlipidemic treatment includes statins. The current treatment provides moderate improvement of lipids. There are no compliance problems.  Risk factors for coronary artery disease include dyslipidemia and hypertension.  Gastroesophageal Reflux She reports no abdominal pain, no chest pain, no choking, no coughing, no dysphagia, no nausea, no sore throat, no stridor or no wheezing. This is a recurrent problem. The current episode started more than 1 year ago. The problem has been unchanged. The symptoms are aggravated by certain foods. Pertinent negatives include no fatigue.    Review of Systems  Constitutional: Negative.  Negative for appetite change, chills, fatigue, fever, irritability, malaise/fatigue and unexpected weight change.  HENT: Negative for congestion, ear discharge, ear pain, rhinorrhea, sinus pressure, sneezing and sore throat.   Eyes: Negative for blurred vision, photophobia, pain, discharge, redness and itching.  Respiratory: Negative for cough, choking, shortness of breath, wheezing and stridor.   Cardiovascular: Negative for chest pain, palpitations, orthopnea and PND.  Gastrointestinal: Negative for abdominal pain, blood in stool, constipation, diarrhea, dysphagia, nausea and vomiting.  Endocrine: Negative for cold intolerance, heat intolerance, polydipsia, polyphagia and polyuria.  Genitourinary: Negative for dysuria, flank  pain, frequency, hematuria, impotence, menstrual problem, pelvic pain, urgency, vaginal bleeding and vaginal discharge.  Musculoskeletal: Negative for arthralgias, back pain, myalgias and neck pain.  Skin: Negative for rash.  Allergic/Immunologic: Negative for environmental allergies and food allergies.  Neurological: Negative for dizziness, focal weakness, weakness, light-headedness, numbness and headaches.  Hematological: Negative for adenopathy. Does not bruise/bleed easily.  Psychiatric/Behavioral: Positive for depression. Negative for confusion, decreased concentration, dysphoric mood and suicidal ideas. The patient is nervous/anxious. The patient does not have insomnia.     Patient Active Problem List   Diagnosis Date Noted  . Pure hypercholesterolemia 09/24/2018  . History of biliary T-tube placement 01/22/2018  . Recurrent major depressive episodes ([place]) 01/22/2018  . Age-related osteoporosis without current pathological fracture 09/09/2017  . Hyperlipidemia due to type 2 diabetes mellitus ([place]) 09/09/2017  . Panic attacks 04/16/2017  . Hyperparathyroidism, primary ([place]) 03/01/2017  . Multinodular goiter 03/01/2017  . Essential hypertension 06/28/2016  . Chronic anxiety 06/28/2016  . Type 2 diabetes mellitus without complication, without long-term current use of insulin ([place]) 08/09/2015  . Familial multiple lipoprotein-type hyperlipidemia 08/09/2015  . Pain in joint 08/09/2015  . Encounter for general adult medical examination without abnormal findings 08/09/2015  . Chronic obstructive pulmonary disease (COPD) ([place]) 08/09/2015  . H/O: depression 08/09/2015  . Gastroesophageal reflux disease without esophagitis 08/09/2015  . Blood glucose elevated 08/09/2015  . Encounter for screening for osteoporosis 08/09/2015  . Need for vaccination 08/09/2015  . Chronic MEE (middle ear effusion) 08/09/2015  . 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  Other Shortness Of Breath    Pepper  . Eggs Or Egg-Derived Products   . Sulfa Antibiotics     Past Surgical History:  Procedure Laterality Date  . BIOPSY BREAST     benign  . BREAST BIOPSY Right 2014   core with clips;benign per pt  . COLONOSCOPY  2008   cleared for 10 years/ occults- 07/22/2015- negative  . INNER EAR SURGERY     benign tumor behind eardrum  . PARATHYROID EXPLORATION     nodule removed  . THYROID EXPLORATION  04/2017   nodule removed    Social History   Tobacco Use  . Smoking status: Former Smoker    Packs/day: 0.50    Years: 30.00    Pack years: 15.00    Types: Cigarettes    Quit date: 2003    Years since quitting: 17.8  . Smokeless tobacco: Never Used  Substance Use Topics  . Alcohol use: No    Alcohol/week: 0.0 standard drinks  . Drug use: No     Medication list has been reviewed and updated.  Current Meds  Medication Sig  . ACCU-CHEK AVIVA PLUS test strip USE AS DIRECTED  . aspirin 81 MG tablet Take 1 tablet by mouth daily.  . busPIRone (BUSPAR) 5 MG tablet TAKE ONE TABLET TWICE DAILY  . carbamide peroxide (DEBROX) 6.5 % OTIC solution Place 5 drops into the right ear 2 (two) times daily.  . cetirizine (ZYRTEC) 5 MG tablet Take 1 tablet by mouth as needed.  . Cholecalciferol (VITAMIN D-1000 MAX ST) 1000 units tablet Take 1,000 mg by mouth daily.  . fluticasone (FLONASE) 50 MCG/ACT nasal spray Place 1 spray into both nostrils as needed.  Marland Kitchen glipiZIDE (GLUCOTROL XL) 5 MG 24 hr tablet Take 2.5 mg by mouth daily with breakfast. Dr Honor Junes  . linagliptin (TRADJENTA) 5 MG TABS tablet Take 5 mg by mouth daily. Dr Honor Junes  . metFORMIN (GLUCOPHAGE) 1000 MG tablet Take 1 tablet (1,000 mg total) by mouth 2 (two) times daily. (Patient taking differently: Take 1,000 mg  by mouth 2 (two) times daily. Dr Honor Junes)  . metoprolol tartrate (LOPRESSOR) 100 MG tablet Take 1 tablet (100 mg total) by mouth 2 (two) times daily.  Marland Kitchen omeprazole (PRILOSEC) 20 MG  capsule TAKE 1 CAPSULE BY MOUTH ONCE DAILY  . PARoxetine (PAXIL) 10 MG tablet Take 1 tablet (10 mg total) by mouth daily.  . ramipril (ALTACE) 10 MG capsule Take 1 capsule (10 mg total) by mouth daily.  . simvastatin (ZOCOR) 20 MG tablet Take 1 tablet (20 mg total) by mouth daily.    PHQ 2/9 Scores 07/21/2019 01/13/2019 10/13/2018 09/24/2018  PHQ - 2 Score 0 0 0 0  PHQ- 9 Score 0 0 0 0    BP Readings from Last 3 Encounters:  09/01/19 122/83  07/21/19 (!) 152/84  06/08/19 (!) 150/90    Physical Exam Vitals signs and nursing note reviewed.  Constitutional:      General: She is not irritable.She is not in acute distress.    Appearance: She is not diaphoretic.  HENT:     Head: Normocephalic and atraumatic.     Right Ear: Tympanic membrane, ear canal and external ear normal.     Left Ear: Tympanic membrane, ear canal and external ear normal.     Nose: Nose normal. No congestion or rhinorrhea.  Eyes:     General:        Right eye: No discharge.        Left eye: No discharge.     Conjunctiva/sclera: Conjunctivae normal.     Pupils: Pupils are equal, round, and reactive to light.  Neck:     Musculoskeletal: Normal range of motion and neck supple.     Thyroid: No thyromegaly.     Vascular: No JVD.  Cardiovascular:     Rate and Rhythm: Normal rate and regular rhythm.     Pulses: Normal pulses.     Heart sounds: Normal heart sounds. No murmur. No friction rub. No gallop.   Pulmonary:     Effort: Pulmonary effort is normal.     Breath sounds: Normal breath sounds. No wheezing or rhonchi.  Abdominal:     General: Bowel sounds are normal.     Palpations: Abdomen is soft. There is no mass.     Tenderness: There is no abdominal tenderness. There is no guarding.  Musculoskeletal: Normal range of motion.  Lymphadenopathy:     Cervical: No cervical adenopathy.  Skin:    General: Skin is warm and dry.     Findings: No bruising or erythema.  Neurological:     Mental Status: She is alert.      Deep Tendon Reflexes: Reflexes are normal and symmetric.     Wt Readings from Last 3 Encounters:  09/01/19 178 lb (80.7 kg)  07/21/19 177 lb (80.3 kg)  06/08/19 177 lb (80.3 kg)    BP 122/83   Pulse 74   Resp 16   Ht 5\' 8"  (1.727 m)   Wt 178 lb (80.7 kg)   SpO2 98%   BMI 27.06 kg/m   Assessment and Plan:  1. Chronic anxiety Chronic.  Controlled.  Gad score 1 - busPIRone (BUSPAR) 5 MG tablet; Take 1 tablet (5 mg total) by mouth 2 (two) times daily.  Dispense: 180 tablet; Refill: 1 - PARoxetine (PAXIL) 10 MG tablet; Take 1 tablet (10 mg total) by mouth daily.  Dispense: 90 tablet; Refill: 1  2. Essential hypertension Chronic.  Controlled.  Stable.  Patient with increase of medications has  now got good control of her blood pressure range.  Will continue ramipril 10 mg once a day and metoprolol 100 mg twice a day. - ramipril (ALTACE) 10 MG capsule; Take 1 capsule (10 mg total) by mouth daily.  Dispense: 90 capsule; Refill: 1 - metoprolol tartrate (LOPRESSOR) 100 MG tablet; Take 1 tablet (100 mg total) by mouth 2 (two) times daily.  Dispense: 180 tablet; Refill: 1  3. Gastroesophageal reflux disease without esophagitis Chronic.  Controlled.  Stable.  We will continue omeprazole 20 mg once a day. - omeprazole (PRILOSEC) 20 MG capsule; TAKE 1 CAPSULE BY MOUTH ONCE DAILY  Dispense: 90 capsule; Refill: 1  4. Pure hypercholesterolemia Review of cholesterol notes that LDLs in good range with the simvastatin 20 mg once a day with occasional elevation of triglycerides presumably from her diabetes. - simvastatin (ZOCOR) 20 MG tablet; Take 1 tablet (20 mg total) by mouth daily.  Dispense: 90 tablet; Refill: 1  5. Thrombocytopenia ([place]) Patient with a history of thrombocytopenia which on review was then reasonable range at 133,000 and although it was low this is sufficient for coagulation circumstances.  We will continue to monitor with CBC today. - CBC with Differential/Platelet

## 2019-10-21 ENCOUNTER — Ambulatory Visit (INDEPENDENT_AMBULATORY_CARE_PROVIDER_SITE_OTHER): Payer: Medicare Other

## 2019-10-21 VITALS — Ht 68.0 in | Wt 177.0 lb

## 2019-10-21 DIAGNOSIS — Z0000 Encounter for general adult medical examination without abnormal findings: Secondary | ICD-10-CM

## 2019-10-21 DIAGNOSIS — Z1231 Encounter for screening mammogram for malignant neoplasm of breast: Secondary | ICD-10-CM

## 2019-10-21 NOTE — Progress Notes (Signed)
Subjective:   Bianca Hawkins is a 73 y.o. female who presents for Medicare Annual (Subsequent) preventive examination.  Virtual Visit via Telephone Note  I connected with Bianca Hawkins on 10/21/19 at 10:40 AM EST by telephone and verified that I am speaking with the correct person using two identifiers.  Medicare Annual Wellness visit completed telephonically due to Covid-19 pandemic.   Location: Patient: home Provider: office   I discussed the limitations, risks, security and privacy concerns of performing an evaluation and management service by telephone and the availability of in person appointments. The patient expressed understanding and agreed to proceed.  Some vital signs may be absent or patient reported.   Bianca Marker, LPN    Review of Systems:   Cardiac Risk Factors include: advanced age (>56men, >25 women);diabetes mellitus;dyslipidemia;hypertension     Objective:     Vitals: Ht 5\' 8"  (1.727 m)    Wt 177 lb (80.3 kg)    BMI 26.91 kg/m   Body mass index is 26.91 kg/m.  Advanced Directives 10/21/2019 10/13/2018 10/10/2017 09/05/2015 08/09/2015  Does Patient Have a Medical Advance Directive? Yes Yes No;Yes Yes Yes  Type of Paramedic of [place];Living will Living will;Healthcare Power of [place];Living will Living will;Healthcare Power of Attorney -  Copy of [place] in Chart? No - copy requested No - copy requested No - copy requested No - copy requested No - copy requested    Tobacco Social History   Tobacco Use  Smoking Status Former Smoker   Packs/day: 0.50   Years: 30.00   Pack years: 15.00   Types: Cigarettes   Quit date: 2003   Years since quitting: 17.9  Smokeless Tobacco Never Used     Counseling given: Not Answered   Clinical Intake:  Pre-visit preparation completed: Yes  Pain : No/denies pain     BMI - recorded: 26.91 Nutritional Status: BMI  25 -29 Overweight Nutritional Risks: None Diabetes: Yes CBG done?: No Did pt. bring in CBG monitor from home?: No   Nutrition Risk Assessment:  Has the patient had any N/V/D within the last 2 months?  No  Does the patient have any non-healing wounds?  No  Has the patient had any unintentional weight loss or weight gain?  No   Diabetes:  Is the patient diabetic?  Yes  If diabetic, was a CBG obtained today?  No  Did the patient bring in their glucometer from home?  No  How often do you monitor your CBG's? Daily fasting in am per patient, today = 106.   Financial Strains and Diabetes Management:  Are you having any financial strains with the device, your supplies or your medication? No .  Does the patient want to be seen by Chronic Care Management for management of their diabetes?  No  Would the patient like to be referred to a Nutritionist or for Diabetic Management?  No   Diabetic Exams:  Diabetic Eye Exam: Completed 11/17/18 per patient. Need results from [facility].   Diabetic Foot Exam: Completed 08/27/18. Pt has been advised about the importance in completing this exam. Pt has upcoming appt with endocrinology.   How often do you need to have someone help you when you read instructions, pamphlets, or other written materials from your doctor or pharmacy?: 1 - Never  Interpreter Needed?: No  Information entered by :: Bianca Marker LPN  Past Medical History:  Diagnosis Date   Allergy  Anxiety    Depression    reccurent depression to due situational disturbance   Diabetes mellitus without complication (HCC)    GERD (gastroesophageal reflux disease)    Hyperlipidemia    Hypertension    Osteoporosis    Past Surgical History:  Procedure Laterality Date   BIOPSY BREAST     benign   BREAST BIOPSY Right 2014   core with clips;benign per pt   COLONOSCOPY  2008   cleared for 10 years/ occults- 07/22/2015- negative   INNER EAR SURGERY     benign tumor  behind eardrum   PARATHYROID EXPLORATION     nodule removed   THYROID EXPLORATION  04/2017   nodule removed   Family History  Problem Relation Age of Onset   Ulcerative colitis Mother    Heart attack Father    Heart disease Sister    Birth defects Sister    Healthy Brother    Stroke Daughter    Healthy Son    Breast cancer Neg Hx    Social History   Socioeconomic History   Marital status: Widowed    Spouse name: Not on file   Number of children: 2   Years of education: Not on file   Highest education level: Associate degree: academic program  Occupational History   Occupation: Retired  Scientist, product/process development strain: Not hard at [employer] insecurity    Worry: Never true    Inability: Never true   Transportation needs    Medical: No    Non-medical: No  Tobacco Use   Smoking status: Former Smoker    Packs/day: 0.50    Years: 30.00    Pack years: 15.00    Types: Cigarettes    Quit date: 2003    Years since quitting: 17.9   Smokeless tobacco: Never Used  Substance and Sexual Activity   Alcohol use: No    Alcohol/week: 0.0 standard drinks   Drug use: No   Sexual activity: Not Currently  Lifestyle   Physical activity    Days per week: 0 days    Minutes per session: 0 min   Stress: Not at all  Relationships   Social connections    Talks on phone: More than three times a week    Gets together: Once a week    Attends religious service: More than 4 times per year    Active member of club or organization: Yes    Attends meetings of clubs or organizations: More than 4 times per year    Relationship status: Widowed  Other Topics Concern   Not on file  Social History Narrative   Not on file    Outpatient Encounter Medications as of 10/21/2019  Medication Sig   ACCU-CHEK AVIVA PLUS test strip USE AS DIRECTED   aspirin 81 MG tablet Take 1 tablet by mouth daily.   cetirizine (ZYRTEC) 5 MG tablet Take 1 tablet by mouth as  needed.   Cholecalciferol (VITAMIN D-1000 MAX ST) 1000 units tablet Take 1,000 mg by mouth daily.   fluticasone (FLONASE) 50 MCG/ACT nasal spray Place 1 spray into both nostrils as needed.   glipiZIDE (GLUCOTROL XL) 5 MG 24 hr tablet Take 2.5 mg by mouth daily with breakfast. Dr Honor Hawkins   linagliptin (TRADJENTA) 5 MG TABS tablet Take 5 mg by mouth daily. Dr Honor Hawkins   metFORMIN (GLUCOPHAGE) 1000 MG tablet Take 1 tablet (1,000 mg total) by mouth 2 (two) times daily. (Patient taking differently:  Take 1,000 mg by mouth 2 (two) times daily. Dr Honor Hawkins)   metoprolol tartrate (LOPRESSOR) 100 MG tablet Take 1 tablet (100 mg total) by mouth 2 (two) times daily.   omeprazole (PRILOSEC) 20 MG capsule TAKE 1 CAPSULE BY MOUTH ONCE DAILY   PARoxetine (PAXIL) 10 MG tablet Take 1 tablet (10 mg total) by mouth daily.   ramipril (ALTACE) 10 MG capsule Take 1 capsule (10 mg total) by mouth daily.   simvastatin (ZOCOR) 20 MG tablet Take 1 tablet (20 mg total) by mouth daily.   busPIRone (BUSPAR) 5 MG tablet Take 1 tablet (5 mg total) by mouth 2 (two) times daily. (Patient not taking: Reported on 10/21/2019)   [DISCONTINUED] carbamide peroxide (DEBROX) 6.5 % OTIC solution Place 5 drops into the right ear 2 (two) times daily.   No facility-administered encounter medications on file as of 10/21/2019.     Activities of Daily Living In your present state of health, do you have any difficulty performing the following activities: 10/21/2019  Hearing? N  Comment declines hearing aids  Vision? N  Difficulty concentrating or making decisions? N  Walking or climbing stairs? N  Dressing or bathing? N  Doing errands, shopping? N  Preparing Food and eating ? N  Using the Toilet? N  In the past six months, have you accidently leaked urine? N  Do you have problems with loss of bowel control? N  Managing your Medications? N  Managing your Finances? N  Housekeeping or managing your Housekeeping? N  Some  recent data might be hidden    Patient Care Team: Juline Patch, MD as PCP - General (Family Medicine) Lonia Farber, MD as Consulting Physician (Internal Medicine)    Assessment:   This is a routine wellness examination for [place].  Exercise Activities and Dietary recommendations Current Exercise Habits: The patient does not participate in regular exercise at present, Exercise limited by: None identified  Goals     Exercise 150 min/wk Moderate Activity     Recommend to exercise at least 150 minutes per week       Fall Risk Fall Risk  10/21/2019 01/13/2019 10/13/2018 10/10/2017 05/28/2017  Falls in the past year? 0 0 0 No No  Number falls in past yr: 0 0 0 - -  Injury with Fall? 0 0 - - -  Risk for fall due to : - - - - History of fall(s)  Follow up Falls prevention discussed - - - -   [place]:  Any stairs in or around the home? No  If so, do they handrails? No   Home free of loose throw rugs in walkways, pet beds, electrical cords, etc? Yes  Adequate lighting in your home to reduce risk of falls? Yes   ASSISTIVE DEVICES UTILIZED TO PREVENT FALLS:  Life alert? No  Use of a cane, walker or w/c? No  Grab bars in the bathroom? Yes  Shower chair or bench in shower? No  Elevated toilet seat or a handicapped toilet? No   DME ORDERS:  DME order needed?  No   TIMED UP AND GO:  Was the test performed? No . Telephonic visit.   Education: Fall risk prevention has been discussed.  Intervention(s) required? No   Depression Screen PHQ 2/9 Scores 10/21/2019 07/21/2019 01/13/2019 10/13/2018  PHQ - 2 Score 0 0 0 0  PHQ- 9 Score - 0 0 0     Cognitive Function  6CIT Screen 10/21/2019 10/10/2017  What Year? 0 points 0 points  What month? 0 points 0 points  What time? 0 points 0 points  Count back from 20 0 points 0 points  Months in reverse 0 points 0 points  Repeat phrase 0 points 0 points  Total Score 0 0    Immunization  History  Administered Date(s) Administered   Pneumococcal Polysaccharide-23 09/03/2013   Tdap 09/03/2013   Zoster 11/13/2011    Qualifies for Shingles Vaccine? Yes  Zostavax completed 2013. Due for Shingrix. Education has been provided regarding the importance of this vaccine. Pt has been advised to call insurance company to determine out of pocket expense. Advised may also receive vaccine at local pharmacy or Health Dept. Verbalized acceptance and understanding.  Tdap: Up to date  Flu Vaccine: allergic to eggs  Pneumococcal Vaccine: Due for Pneumococcal vaccine. Does the patient want to receive this vaccine today?  No . Education has been provided regarding the importance of this vaccine but still declined. Advised may receive this vaccine at local pharmacy or Health Dept. Aware to provide a copy of the vaccination record if obtained from local pharmacy or Health Dept. Verbalized acceptance and understanding.   Screening Tests Health Maintenance  Topic Date Due   Fecal DNA (Cologuard)  07/21/2018   FOOT EXAM  09/01/2018   HEMOGLOBIN A1C  02/26/2019   OPHTHALMOLOGY EXAM  11/13/2019   MAMMOGRAM  01/27/2020   TETANUS/TDAP  09/04/2023   DEXA SCAN  Completed   Hepatitis C Screening  Completed   PNA vac Low Risk Adult  Completed   INFLUENZA VACCINE  Discontinued    Cancer Screenings:  Colorectal Screening: Cologuard completed 07/22/15. Repeat every 3 years; Per patient she has discussed this with Dr. Ronnald Ramp and would like to do a repeat screening colonoscopy but still wants to hold off due to pandemic. She is also hesitant to do colonoscopy prep stating she does not tolerate having to drink colon prep. Pt plans to discuss again at next appointment and declined referral to GI at this time.   Mammogram: Completed 01/27/19. Repeat every year. Ordered today. Pt provided with contact information and advised to call to schedule appt.   Bone Density: Completed 11/07/16. Results  reflect OSTEOPOROSIS. Repeat every 2 years. Ordered by endocrinology due to osteoporosis and taking prolia.   Lung Cancer Screening: (Low Dose CT Chest recommended if Age 33-80 years, 30 pack-year currently smoking OR have quit w/in 15years.) does not qualify.    Additional Screening:  Hepatitis C Screening: does qualify; Completed 09/09/17  Vision Screening: Recommended annual ophthalmology exams for early detection of glaucoma and other disorders of the eye. Is the patient up to date with their annual eye exam?  Yes  Who is the provider or what is the name of the office in which the pt attends annual eye exams? Dr. Ellin Mayhew  Dental Screening: Recommended annual dental exams for proper oral hygiene  Community Resource Referral:  CRR required this visit?  No      Plan:     I have personally reviewed and addressed the Medicare Annual Wellness questionnaire and have noted the following in the patients chart:  A. Medical and social history B. Use of alcohol, tobacco or illicit drugs  C. Current medications and supplements D. Functional ability and status E.  Nutritional status F.  Physical activity G. Advance directives H. List of other physicians I.  Hospitalizations, surgeries, and ER visits in previous 12 months J.  Vitals  K. Screenings such as hearing and vision if needed, cognitive and depression L. Referrals and appointments   In addition, I have reviewed and discussed with patient certain preventive protocols, quality metrics, and best practice recommendations. A written personalized care plan for preventive services as well as general preventive health recommendations were provided to patient.   Signed,  Bianca Marker, LPN Nurse Health Advisor   Nurse Notes: pt advised due for follow up with Dr. Ronnald Ramp 02/2020.

## 2019-10-21 NOTE — Patient Instructions (Signed)
Bianca Hawkins , Thank you for taking time to come for your Medicare Wellness Visit. I appreciate your ongoing commitment to your health goals. Please review the following plan we discussed and let me know if I can assist you in the future.   Screening recommendations/referrals: Colonoscopy: Cologuard done 07/22/15. Please discuss repeat colonoscopy screening with Dr. Ronnald Ramp at next appt.  Mammogram: done 01/27/19. Please call [phone] to schedule your mammogram after 01/27/20. Bone Density: done 11/07/16.  Recommended yearly ophthalmology/optometry visit for glaucoma screening and checkup Recommended yearly dental visit for hygiene and checkup  Vaccinations: Influenza vaccine: n/a Pneumococcal vaccine: done 09/03/13 Tdap vaccine: done 09/03/13 Shingles vaccine: Shingrix discussed. Please contact your pharmacy for coverage information.   Advanced directives: Please bring a copy of your health care power of attorney and living will to the office at your convenience.  Conditions/risks identified: Recommend increasing physical activity.   Next appointment: Please follow up in one year for your Medicare Annual Wellness visit.     Preventive Care 40 Years and Older, Female Preventive care refers to lifestyle choices and visits with your health care provider that can promote health and wellness. What does preventive care include?  A yearly physical exam. This is also called an annual well check.  Dental exams once or twice a year.  Routine eye exams. Ask your health care provider how often you should have your eyes checked.  Personal lifestyle choices, including:  Daily care of your teeth and gums.  Regular physical activity.  Eating a healthy diet.  Avoiding tobacco and drug use.  Limiting alcohol use.  Practicing safe sex.  Taking low-dose aspirin every day.  Taking vitamin and mineral supplements as recommended by your health care provider. What happens during an annual  well check? The services and screenings done by your health care provider during your annual well check will depend on your age, overall health, lifestyle risk factors, and family history of disease. Counseling  Your health care provider may ask you questions about your:  Alcohol use.  Tobacco use.  Drug use.  Emotional well-being.  Home and relationship well-being.  Sexual activity.  Eating habits.  History of falls.  Memory and ability to understand (cognition).  Work and work Statistician.  Reproductive health. Screening  You may have the following tests or measurements:  Height, weight, and BMI.  Blood pressure.  Lipid and cholesterol levels. These may be checked every 5 years, or more frequently if you are over 53 years old.  Skin check.  Lung cancer screening. You may have this screening every year starting at age 38 if you have a 30-pack-year history of smoking and currently smoke or have quit within the past 15 years.  Fecal occult blood test (FOBT) of the stool. You may have this test every year starting at age 69.  Flexible sigmoidoscopy or colonoscopy. You may have a sigmoidoscopy every 5 years or a colonoscopy every 10 years starting at age 77.  Hepatitis C blood test.  Hepatitis B blood test.  Sexually transmitted disease (STD) testing.  Diabetes screening. This is done by checking your blood sugar (glucose) after you have not eaten for a while (fasting). You may have this done every 1-3 years.  Bone density scan. This is done to screen for osteoporosis. You may have this done starting at age 68.  Mammogram. This may be done every 1-2 years. Talk to your health care provider about how often you should have regular mammograms. Talk with your health  care provider about your test results, treatment options, and if necessary, the need for more tests. Vaccines  Your health care provider may recommend certain vaccines, such as:  Influenza vaccine. This  is recommended every year.  Tetanus, diphtheria, and acellular pertussis (Tdap, Td) vaccine. You may need a Td booster every 10 years.  Zoster vaccine. You may need this after age 85.  Pneumococcal 13-valent conjugate (PCV13) vaccine. One dose is recommended after age 33.  Pneumococcal polysaccharide (PPSV23) vaccine. One dose is recommended after age 64. Talk to your health care provider about which screenings and vaccines you need and how often you need them. This information is not intended to replace advice given to you by your health care provider. Make sure you discuss any questions you have with your health care provider. Document Released: 11/25/2015 Document Revised: 07/18/2016 Document Reviewed: 08/30/2015 Elsevier Interactive Patient Education  2017 [employer] Prevention in the Home Falls can cause injuries. They can happen to people of all ages. There are many things you can do to make your home safe and to help prevent falls. What can I do on the outside of my home?  Regularly fix the edges of walkways and driveways and fix any cracks.  Remove anything that might make you trip as you walk through a door, such as a raised step or threshold.  Trim any bushes or trees on the path to your home.  Use bright outdoor lighting.  Clear any walking paths of anything that might make someone trip, such as rocks or tools.  Regularly check to see if handrails are loose or broken. Make sure that both sides of any steps have handrails.  Any raised decks and porches should have guardrails on the edges.  Have any leaves, snow, or ice cleared regularly.  Use sand or salt on walking paths during winter.  Clean up any spills in your garage right away. This includes oil or grease spills. What can I do in the bathroom?  Use night lights.  Install grab bars by the toilet and in the tub and shower. Do not use towel bars as grab bars.  Use non-skid mats or decals in the tub or  shower.  If you need to sit down in the shower, use a plastic, non-slip stool.  Keep the floor dry. Clean up any water that spills on the floor as soon as it happens.  Remove soap buildup in the tub or shower regularly.  Attach bath mats securely with double-sided non-slip rug tape.  Do not have throw rugs and other things on the floor that can make you trip. What can I do in the bedroom?  Use night lights.  Make sure that you have a light by your bed that is easy to reach.  Do not use any sheets or blankets that are too big for your bed. They should not hang down onto the floor.  Have a firm chair that has side arms. You can use this for support while you get dressed.  Do not have throw rugs and other things on the floor that can make you trip. What can I do in the kitchen?  Clean up any spills right away.  Avoid walking on wet floors.  Keep items that you use a lot in easy-to-reach places.  If you need to reach something above you, use a strong step stool that has a grab bar.  Keep electrical cords out of the way.  Do not use floor  polish or wax that makes floors slippery. If you must use wax, use non-skid floor wax.  Do not have throw rugs and other things on the floor that can make you trip. What can I do with my stairs?  Do not leave any items on the stairs.  Make sure that there are handrails on both sides of the stairs and use them. Fix handrails that are broken or loose. Make sure that handrails are as long as the stairways.  Check any carpeting to make sure that it is firmly attached to the stairs. Fix any carpet that is loose or worn.  Avoid having throw rugs at the top or bottom of the stairs. If you do have throw rugs, attach them to the floor with carpet tape.  Make sure that you have a light switch at the top of the stairs and the bottom of the stairs. If you do not have them, ask someone to add them for you. What else can I do to help prevent falls?   Wear shoes that:  Do not have high heels.  Have rubber bottoms.  Are comfortable and fit you well.  Are closed at the toe. Do not wear sandals.  If you use a stepladder:  Make sure that it is fully opened. Do not climb a closed stepladder.  Make sure that both sides of the stepladder are locked into place.  Ask someone to hold it for you, if possible.  Clearly mark and make sure that you can see:  Any grab bars or handrails.  First and last steps.  Where the edge of each step is.  Use tools that help you move around (mobility aids) if they are needed. These include:  Canes.  Walkers.  Scooters.  Crutches.  Turn on the lights when you go into a dark area. Replace any light bulbs as soon as they burn out.  Set up your furniture so you have a clear path. Avoid moving your furniture around.  If any of your floors are uneven, fix them.  If there are any pets around you, be aware of where they are.  Review your medicines with your doctor. Some medicines can make you feel dizzy. This can increase your chance of falling. Ask your doctor what other things that you can do to help prevent falls. This information is not intended to replace advice given to you by your health care provider. Make sure you discuss any questions you have with your health care provider. Document Released: 08/25/2009 Document Revised: 04/05/2016 Document Reviewed: 12/03/2014 Elsevier Interactive Patient Education  2017 [employer].

## 2019-10-27 ENCOUNTER — Other Ambulatory Visit: Payer: Self-pay

## 2019-10-27 DIAGNOSIS — J301 Allergic rhinitis due to pollen: Secondary | ICD-10-CM

## 2019-10-27 MED ORDER — FLUTICASONE PROPIONATE 50 MCG/ACT NA SUSP: 1.0000 | NASAL | 0 refills | Status: DC | PRN

## 2019-10-27 NOTE — Progress Notes (Unsigned)
[facility] in [facility]

## 2019-11-18 DIAGNOSIS — I10 Essential (primary) hypertension: Secondary | ICD-10-CM | POA: Diagnosis not present

## 2019-11-18 DIAGNOSIS — E1169 Type 2 diabetes mellitus with other specified complication: Secondary | ICD-10-CM | POA: Diagnosis not present

## 2019-11-18 DIAGNOSIS — E1159 Type 2 diabetes mellitus with other circulatory complications: Secondary | ICD-10-CM | POA: Diagnosis not present

## 2019-11-18 DIAGNOSIS — E785 Hyperlipidemia, unspecified: Secondary | ICD-10-CM | POA: Diagnosis not present

## 2019-11-18 DIAGNOSIS — E119 Type 2 diabetes mellitus without complications: Secondary | ICD-10-CM | POA: Diagnosis not present

## 2019-11-18 LAB — HEMOGLOBIN A1C: Hemoglobin A1C: 7.3

## 2019-11-25 DIAGNOSIS — E1169 Type 2 diabetes mellitus with other specified complication: Secondary | ICD-10-CM | POA: Diagnosis not present

## 2019-11-25 DIAGNOSIS — M810 Age-related osteoporosis without current pathological fracture: Secondary | ICD-10-CM | POA: Diagnosis not present

## 2019-11-25 DIAGNOSIS — E1159 Type 2 diabetes mellitus with other circulatory complications: Secondary | ICD-10-CM | POA: Diagnosis not present

## 2019-11-25 DIAGNOSIS — I10 Essential (primary) hypertension: Secondary | ICD-10-CM | POA: Diagnosis not present

## 2019-11-25 DIAGNOSIS — E042 Nontoxic multinodular goiter: Secondary | ICD-10-CM | POA: Diagnosis not present

## 2019-11-25 DIAGNOSIS — E785 Hyperlipidemia, unspecified: Secondary | ICD-10-CM | POA: Diagnosis not present

## 2019-11-25 DIAGNOSIS — E119 Type 2 diabetes mellitus without complications: Secondary | ICD-10-CM | POA: Diagnosis not present

## 2019-12-22 DIAGNOSIS — E119 Type 2 diabetes mellitus without complications: Secondary | ICD-10-CM | POA: Diagnosis not present

## 2019-12-22 DIAGNOSIS — H35033 Hypertensive retinopathy, bilateral: Secondary | ICD-10-CM | POA: Diagnosis not present

## 2019-12-22 LAB — HM DIABETES EYE EXAM

## 2020-01-14 ENCOUNTER — Encounter: Payer: Self-pay | Admitting: Family Medicine

## 2020-01-14 ENCOUNTER — Other Ambulatory Visit: Payer: Self-pay

## 2020-01-14 ENCOUNTER — Ambulatory Visit: Payer: Medicare PPO | Admitting: Family Medicine

## 2020-01-14 VITALS — BP 138/88 | HR 66 | Ht 68.0 in | Wt 181.0 lb

## 2020-01-14 DIAGNOSIS — M720 Palmar fascial fibromatosis [Dupuytren]: Secondary | ICD-10-CM

## 2020-01-14 NOTE — Patient Instructions (Signed)
Dupuytren's Contracture  Dupuytren's contracture is a condition in which tissue under the skin of the palm becomes thick. This causes one or more of the fingers to curl inward (contract) toward the palm. After a while, the fingers may not be able to straighten out. This condition affects some or all of the fingers and the palm of the hand. This condition may affect one or both hands.  Dupuytren's contracture is a long-term (chronic) condition that develops (progresses) slowly over time. There is no cure, but symptoms can be managed and progression can be slowed with treatment. This condition is usually not dangerous or painful, but it can interfere with everyday tasks.  What are the causes?    This condition is caused by tissue (fascia) in the palm that gets thicker and tighter. When the fascia thickens, it pulls on the cords of tissue (tendons) that control finger movement. This causes the fingers to contract.  The cause of fascia thickening is not known. However, the condition is often passed along from parent to child (inherited).  What increases the risk?  The following factors may make you more likely to develop this condition:  Being 40 years of age or older.  Being female.  Having a family history of this condition.  Using tobacco products, including cigarettes, chewing tobacco, and e-cigarettes.  Drinking alcohol excessively.  Having diabetes.  Having a seizure disorder.  What are the signs or symptoms?    Early symptoms of this condition may include:  Thick, puckered skin on the hand.  One or more lumps (nodules) on the palm. Nodules may be tender when they first appear, but they are generally painless.  Later symptoms of this condition may include:  Thick cords of tissue in the palm.  Fingers curled up toward the palm.  Inability to straighten the fingers into their normal position.  Though this condition is usually painless, you may have discomfort when holding or grabbing objects.  How is this  diagnosed?  This condition is diagnosed with a physical exam, which may include:  Looking at your hands and feeling your palms. This is to check for thickened fascia and nodules.  Measuring finger motion.  Doing the [place] tabletop test. You may be asked to try to put your hand on a surface, with your palm down and your fingers straight out.  How is this treated?  There is no cure for this condition, but treatment can relieve discomfort and make symptoms more manageable. Treatment options may include:  Physical therapy. This can strengthen your hand and increase flexibility.  Occupational therapy. This can help you with everyday tasks that may be more difficult because of your condition.  Shots (injections). Substances may be injected into your hand, such as:  Medicines that help to decrease swelling (corticosteroids).  Proteins (collagenase) to weaken thick tissue. After a collagenase injection, your health care provider may stretch your fingers.  Needle aponeurotomy. A needle is pushed through the skin and into the fascia. Moving the needle against the fascia can weaken or break up the thick tissue.  Surgery. This may be needed if your condition causes discomfort or interferes with everyday activities. Physical therapy is usually needed after surgery.  No treatment is guaranteed to cure this condition. Recurrence of symptoms is common.  Follow these instructions at home:  Hand care  Take these actions to help protect your hand from possible injury:  Use tools that have padded grips.  Wear protective gloves while you work with your   provider.  Manage any other conditions that you have, such as diabetes.  If physical therapy was prescribed, do exercises as told by your health care provider.  Do not use any products  that contain nicotine or tobacco, such as cigarettes, e-cigarettes, and chewing tobacco. If you need help quitting, ask your health care provider.  If you drink alcohol: ? Limit how much you use to:  0-1 drink a day for women.  0-2 drinks a day for men. ? Be aware of how much alcohol is in your drink. In the [place] one drink equals one 12 oz bottle of beer (355 mL), one 5 oz glass of wine (148 mL), or one 1 oz glass of hard liquor (44 mL).  Keep all follow-up visits as told by your health care provider. This is important. Contact a health care provider if:  You develop new symptoms, or your symptoms get worse.  You have pain that gets worse or does not get better with medicine.  You have difficulty or discomfort with everyday tasks.  You develop numbness or tingling. Get help right away if:  You have severe pain.  Your fingers change color or become unusually cold. Summary  Dupuytren's contracture is a condition in which tissue under the skin of the palm becomes thick.  This condition is caused by tissue (fascia) that thickens. When it thickens, it pulls on the cords of tissue (tendons) that control finger movement and makes the fingers to contract.  You are more likely to develop this condition if you are a man, are over 40 years of age, have a family history of the condition, and drink a lot of alcohol.  This condition can be treated with physical and occupational therapy, injections, and surgery.  Follow instructions about how to care for your hand. Get help right away if you have severe pain or your fingers change color or become cold. This information is not intended to replace advice given to you by your health care provider. Make sure you discuss any questions you have with your health care provider. Document Revised: 05/20/2018 Document Reviewed: 05/20/2018 Elsevier Patient Education  2020 Elsevier Inc.  

## 2020-01-14 NOTE — Progress Notes (Signed)
Date:  01/14/2020   Name:  Bianca Hawkins   DOB:  1946-09-20   MRN:  [id number]   Chief Complaint: Mass (right hand, found sunday doesnt know how long it been there, no pain, size has gotten smaller)  Patient noted a palpable area of firmness in the palm of her right hand at the flexor tendon of the fourth phalanx.  Hand Pain  Incident onset: no pain/swelling 4th flexor tendon. The injury mechanism was twisted (removing jar lids). The pain is present in the right hand. Quality: no pain. Pertinent negatives include no muscle weakness, numbness or tingling. The symptoms are aggravated by movement.    Lab Results  Component Value Date   CREATININE 0.7 12/11/2017   BUN 18 12/11/2017   NA 138 12/11/2017   K 6.0 (A) 12/11/2017   CL 101 10/15/2016   CO2 32 10/15/2016   Lab Results  Component Value Date   CHOL 157 09/25/2016   HDL 63 09/25/2016   LDLCALC 71 09/25/2016   TRIG 114 09/25/2016   CHOLHDL 2.5 09/25/2016   Lab Results  Component Value Date   TSH 0.55 12/11/2017   Lab Results  Component Value Date   HGBA1C 6.9 05/20/2019     Review of Systems  Constitutional: Negative.  Negative for chills, fatigue, fever and unexpected weight change.  HENT: Negative for congestion, ear discharge, ear pain, rhinorrhea, sinus pressure, sneezing and sore throat.   Eyes: Negative for photophobia, pain, discharge, redness and itching.  Respiratory: Negative for cough, shortness of breath, wheezing and stridor.   Gastrointestinal: Negative for abdominal pain, blood in stool, constipation, diarrhea, nausea and vomiting.  Endocrine: Negative for cold intolerance, heat intolerance, polydipsia, polyphagia and polyuria.  Genitourinary: Negative for dysuria, flank pain, frequency, hematuria, menstrual problem, pelvic pain, urgency, vaginal bleeding and vaginal discharge.  Musculoskeletal: Negative for arthralgias, back pain and myalgias.  Skin: Negative for rash.    Allergic/Immunologic: Negative for environmental allergies and food allergies.  Neurological: Negative for dizziness, tingling, weakness, light-headedness, numbness and headaches.  Hematological: Negative for adenopathy. Does not bruise/bleed easily.  Psychiatric/Behavioral: Negative for dysphoric mood. The patient is not nervous/anxious.     Patient Active Problem List   Diagnosis Date Noted  . Pure hypercholesterolemia 09/24/2018  . History of biliary T-tube placement 01/22/2018  . Recurrent major depressive episodes ([place]) 01/22/2018  . Age-related osteoporosis without current pathological fracture 09/09/2017  . Hyperlipidemia due to type 2 diabetes mellitus ([place]) 09/09/2017  . Panic attacks 04/16/2017  . Hyperparathyroidism, primary ([place]) 03/01/2017  . Multinodular goiter 03/01/2017  . Essential hypertension 06/28/2016  . Chronic anxiety 06/28/2016  . Type 2 diabetes mellitus without complication, without long-term current use of insulin ([place]) 08/09/2015  . Familial multiple lipoprotein-type hyperlipidemia 08/09/2015  . Pain in joint 08/09/2015  . Encounter for general adult medical examination without abnormal findings 08/09/2015  . Chronic obstructive pulmonary disease (COPD) ([place]) 08/09/2015  . H/O: depression 08/09/2015  . Gastroesophageal reflux disease without esophagitis 08/09/2015  . Blood glucose elevated 08/09/2015  . Encounter for screening for osteoporosis 08/09/2015  . Need for vaccination 08/09/2015  . Chronic MEE (middle ear effusion) 08/09/2015  . 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 Other Shortness Of Breath    Pepper  . Eggs Or Egg-Derived Products   . Sulfa Antibiotics     Past Surgical History:  Procedure Laterality Date  . BIOPSY BREAST     benign  . BREAST  BIOPSY Right 2014   core with clips;benign per pt  . COLONOSCOPY  2008   cleared for 10 years/ occults- 07/22/2015- negative  . INNER EAR SURGERY      benign tumor behind eardrum  . PARATHYROID EXPLORATION     nodule removed  . THYROID EXPLORATION  04/2017   nodule removed    Social History   Tobacco Use  . Smoking status: Former Smoker    Packs/day: 0.50    Years: 30.00    Pack years: 15.00    Types: Cigarettes    Quit date: 2003    Years since quitting: 18.1  . Smokeless tobacco: Never Used  Substance Use Topics  . Alcohol use: No    Alcohol/week: 0.0 standard drinks  . Drug use: No     Medication list has been reviewed and updated.  Current Meds  Medication Sig  . ACCU-CHEK AVIVA PLUS test strip USE AS DIRECTED  . aspirin 81 MG tablet Take 1 tablet by mouth daily.  . busPIRone (BUSPAR) 5 MG tablet Take 1 tablet (5 mg total) by mouth 2 (two) times daily. (Patient taking differently: Take 5 mg by mouth as needed. )  . cetirizine (ZYRTEC) 5 MG tablet Take 1 tablet by mouth as needed.  . Cholecalciferol (VITAMIN D-1000 MAX ST) 1000 units tablet Take 1,000 mg by mouth daily.  . fluticasone (FLONASE) 50 MCG/ACT nasal spray Place 1 spray into both nostrils as needed.  Marland Kitchen glipiZIDE (GLUCOTROL XL) 5 MG 24 hr tablet Take 5 mg by mouth daily with breakfast. Dr Honor Junes  . linagliptin (TRADJENTA) 5 MG TABS tablet Take 5 mg by mouth daily. Dr Honor Junes  . metFORMIN (GLUCOPHAGE) 1000 MG tablet Take 1 tablet (1,000 mg total) by mouth 2 (two) times daily. (Patient taking differently: Take 1,000 mg by mouth 2 (two) times daily. Dr Honor Junes)  . metoprolol tartrate (LOPRESSOR) 100 MG tablet Take 1 tablet (100 mg total) by mouth 2 (two) times daily.  Marland Kitchen omeprazole (PRILOSEC) 20 MG capsule TAKE 1 CAPSULE BY MOUTH ONCE DAILY  . PARoxetine (PAXIL) 10 MG tablet Take 1 tablet (10 mg total) by mouth daily.  . ramipril (ALTACE) 10 MG capsule Take 1 capsule (10 mg total) by mouth daily.  . simvastatin (ZOCOR) 20 MG tablet Take 1 tablet (20 mg total) by mouth daily.    PHQ 2/9 Scores 01/14/2020 10/21/2019 07/21/2019 01/13/2019  PHQ - 2 Score 0 0 0  0  PHQ- 9 Score 3 - 0 0    BP Readings from Last 3 Encounters:  01/14/20 138/88  09/01/19 122/83  07/21/19 (!) 152/84    Physical Exam Vitals and nursing note reviewed.  Constitutional:      General: She is not in acute distress.    Appearance: She is not diaphoretic.  HENT:     Head: Normocephalic and atraumatic.     Right Ear: External ear normal.     Left Ear: External ear normal.     Nose: Nose normal.  Eyes:     General:        Right eye: No discharge.        Left eye: No discharge.     Conjunctiva/sclera: Conjunctivae normal.     Pupils: Pupils are equal, round, and reactive to light.  Neck:     Thyroid: No thyromegaly.     Vascular: No JVD.  Cardiovascular:     Rate and Rhythm: Normal rate and regular rhythm.     Heart sounds: Normal  heart sounds. No murmur. No friction rub. No gallop.   Pulmonary:     Effort: Pulmonary effort is normal.     Breath sounds: Normal breath sounds.  Abdominal:     General: Bowel sounds are normal.     Palpations: Abdomen is soft. There is no mass.     Tenderness: There is no abdominal tenderness. There is no guarding.  Musculoskeletal:        General: Normal range of motion.     Right hand: Deformity present.     Cervical back: Normal range of motion and neck supple.     Comments: Firmness flex surface  Lymphadenopathy:     Cervical: No cervical adenopathy.  Skin:    General: Skin is warm and dry.  Neurological:     Mental Status: She is alert.     Deep Tendon Reflexes: Reflexes are normal and symmetric.     Wt Readings from Last 3 Encounters:  01/14/20 181 lb (82.1 kg)  10/21/19 177 lb (80.3 kg)  09/01/19 178 lb (80.7 kg)    BP 138/88   Pulse 66   Ht 5\' 8"  (1.727 m)   Wt 181 lb (82.1 kg)   BMI 27.52 kg/m   Assessment and Plan: 1. Dupuytren's contracture Newly noted onset.  Persistent.  Stable without contracture.  Patient noted a knot at the base of her fourth finger in the area of the flexor tendon.  This is  consistent with an early Dupuytren contracture formation.  We will give information on massaging and physical therapy to decrease the progression and refer to orthopedics for further consultation. - Ambulatory referral to Orthopedic Surgery

## 2020-01-19 ENCOUNTER — Encounter: Payer: Self-pay | Admitting: Family Medicine

## 2020-01-25 ENCOUNTER — Encounter: Payer: Self-pay | Admitting: Family Medicine

## 2020-01-25 ENCOUNTER — Other Ambulatory Visit: Payer: Self-pay

## 2020-01-25 DIAGNOSIS — S6391XA Sprain of unspecified part of right wrist and hand, initial encounter: Secondary | ICD-10-CM | POA: Diagnosis not present

## 2020-01-25 DIAGNOSIS — M25541 Pain in joints of right hand: Secondary | ICD-10-CM | POA: Diagnosis not present

## 2020-01-25 DIAGNOSIS — H6122 Impacted cerumen, left ear: Secondary | ICD-10-CM | POA: Diagnosis not present

## 2020-01-25 DIAGNOSIS — Z1231 Encounter for screening mammogram for malignant neoplasm of breast: Secondary | ICD-10-CM

## 2020-01-25 DIAGNOSIS — M720 Palmar fascial fibromatosis [Dupuytren]: Secondary | ICD-10-CM | POA: Diagnosis not present

## 2020-01-25 DIAGNOSIS — H93299 Other abnormal auditory perceptions, unspecified ear: Secondary | ICD-10-CM | POA: Diagnosis not present

## 2020-01-25 DIAGNOSIS — E119 Type 2 diabetes mellitus without complications: Secondary | ICD-10-CM | POA: Diagnosis not present

## 2020-01-26 DIAGNOSIS — M810 Age-related osteoporosis without current pathological fracture: Secondary | ICD-10-CM | POA: Diagnosis not present

## 2020-01-27 ENCOUNTER — Encounter: Payer: Self-pay | Admitting: Family Medicine

## 2020-03-01 ENCOUNTER — Ambulatory Visit
Admission: RE | Admit: 2020-03-01 | Discharge: 2020-03-01 | Disposition: A | Discharge status: Home / Self Care | Payer: Medicare PPO | Source: Ambulatory Visit | Attending: Family Medicine | Admitting: Family Medicine

## 2020-03-01 DIAGNOSIS — Z1231 Encounter for screening mammogram for malignant neoplasm of breast: Secondary | ICD-10-CM

## 2020-03-21 ENCOUNTER — Encounter: Payer: Self-pay | Admitting: Family Medicine

## 2020-03-21 ENCOUNTER — Telehealth: Payer: Self-pay | Admitting: Family Medicine

## 2020-03-21 NOTE — Telephone Encounter (Unsigned)
Copied from [place] [id number]. Topic: General - Other >> Mar 21, 2020  8:46 AM Celene Kras wrote: Reason for CRM: Pt called and is requesting to speak with Baxter Flattery. Please advise.

## 2020-03-21 NOTE — Telephone Encounter (Signed)
Error

## 2020-03-21 NOTE — Telephone Encounter (Signed)
Responded for pt to take the lady to the hospital

## 2020-05-23 ENCOUNTER — Other Ambulatory Visit: Payer: Self-pay

## 2020-05-23 ENCOUNTER — Ambulatory Visit: Payer: Medicare PPO | Admitting: Family Medicine

## 2020-05-23 ENCOUNTER — Encounter: Payer: Self-pay | Admitting: Family Medicine

## 2020-05-23 VITALS — BP 120/80 | HR 68 | Ht 68.0 in | Wt 179.0 lb

## 2020-05-23 DIAGNOSIS — M7631 Iliotibial band syndrome, right leg: Secondary | ICD-10-CM

## 2020-05-23 DIAGNOSIS — Z1211 Encounter for screening for malignant neoplasm of colon: Secondary | ICD-10-CM

## 2020-05-23 MED ORDER — MELOXICAM 15 MG PO TABS: 15.0000 mg | ORAL_TABLET | Freq: Every day | ORAL | 1 refills | Status: DC

## 2020-05-23 NOTE — Patient Instructions (Addendum)
Iliotibial Band Syndrome  Iliotibial band syndrome (ITBS) is a condition that often causes knee pain. It can also cause pain in the outside of your hip, thigh, and knee. The iliotibial band is a strip of tissue that runs from the outside of your hip and down your thigh to the outside of your knee. Repeatedly bending and straightening your knee can irritate the iliotibial band. What are the causes? This condition is caused by inflammation and irritation from the friction of the iliotibial band moving over the thigh bone (femur) when you repeatedly bend and straighten your knee. What increases the risk? This condition is more likely to develop in people who:  Frequently change elevation during their workouts.  Run very long distances.  Recently increased the length or intensity of their workouts.  Run downhill often, or just started running downhill.  Ride a bike very far or often. You may also be at greater risk if you start a new workout routine without first warming up or if you have a job that requires you to bend, squat, or climb frequently. What are the signs or symptoms? Symptoms of this condition include:  Pain along the outside of your knee that may be worse with activity, especially running or going up and down stairs.  A "snapping" sensation over your knee.  Swelling on the outside of your knee.  Pain or a feeling of tightness in your hip. How is this diagnosed? This condition is diagnosed based on your symptoms, medical history, and physical exam. You may also see a health care provider who specializes in reducing pain and increasing mobility (physical therapist). A physical therapist may do an exam to check your balance, movement, and way of walking or running (gait) to see whether the way you move could contribute to your injury. You may also have tests to measure your strength, flexibility, and range of motion. How is this treated? Treatment for this condition  includes:  Resting and limiting exercise.  Returning to activities gradually.  Doing range-of-motion and strengthening exercises (physical therapy) as told by your health care provider.  Including low-impact activities, such as swimming, in your exercise routine. Follow these instructions at home:  If directed, apply ice to the injured area. ? Put ice in a plastic bag. ? Place a towel between your skin and the bag. ? Leave the ice on for 20 minutes, 2-3 times per day.  Return to your normal activities as told by your health care provider. Ask your health care provider what activities are safe for you.  Keep all follow-up visits with your health care provider. This is important. Contact a health care provider if:  Your pain does not improve or gets worse despite treatment. This information is not intended to replace advice given to you by your health care provider. Make sure you discuss any questions you have with your health care provider. Document Revised: 10/11/2017 Document Reviewed: 11/30/2016 Elsevier Patient Education  [place].  Sciatica  Sciatica is pain, numbness, weakness, or tingling along the path of the sciatic nerve. The sciatic nerve starts in the lower back and runs down the back of each leg. The nerve controls the muscles in the lower leg and in the back of the knee. It also provides feeling (sensation) to the back of the thigh, the lower leg, and the sole of the foot. Sciatica is a symptom of another medical condition that pinches or puts pressure on the sciatic nerve. Sciatica most often only affects  one side of the body. Sciatica usually goes away on its own or with treatment. In some cases, sciatica may come back (recur). What are the causes? This condition is caused by pressure on the sciatic nerve or pinching of the nerve. This may be the result of:  A disk in between the bones of the spine bulging out too far (herniated disk).  Age-related changes  in the spinal disks.  A pain disorder that affects a muscle in the buttock.  Extra bone growth near the sciatic nerve.  A break (fracture) of the pelvis.  Pregnancy.  Tumor. This is rare. What increases the risk? The following factors may make you more likely to develop this condition:  Playing sports that place pressure or stress on the spine.  Having poor strength and flexibility.  A history of back injury or surgery.  Sitting for long periods of time.  Doing activities that involve repetitive bending or lifting.  Obesity. What are the signs or symptoms? Symptoms can vary from mild to very severe, and they may include:  Any of these problems in the lower back, leg, hip, or buttock: ? Mild tingling, numbness, or dull aches. ? Burning sensations. ? Sharp pains.  Numbness in the back of the calf or the sole of the foot.  Leg weakness.  Severe back pain that makes movement difficult. Symptoms may get worse when you cough, sneeze, or laugh, or when you sit or stand for long periods of time. How is this diagnosed? This condition may be diagnosed based on:  Your symptoms and medical history.  A physical exam.  Blood tests.  Imaging tests, such as: ? X-rays. ? MRI. ? CT scan. How is this treated? In many cases, this condition improves on its own without treatment. However, treatment may include:  Reducing or modifying physical activity.  Exercising and stretching.  Icing and applying heat to the affected area.  Medicines that help to: ? Relieve pain and swelling. ? Relax your muscles.  Injections of medicines that help to relieve pain, irritation, and inflammation around the sciatic nerve (steroids).  Surgery. Follow these instructions at home: Medicines  Take over-the-counter and prescription medicines only as told by your health care provider.  Ask your health care provider if the medicine prescribed to you: ? Requires you to avoid driving or  using heavy machinery. ? Can cause constipation. You may need to take these actions to prevent or treat constipation:  Drink enough fluid to keep your urine pale yellow.  Take over-the-counter or prescription medicines.  Eat foods that are high in fiber, such as beans, whole grains, and fresh fruits and vegetables.  Limit foods that are high in fat and processed sugars, such as fried or sweet foods. Managing pain      If directed, put ice on the affected area. ? Put ice in a plastic bag. ? Place a towel between your skin and the bag. ? Leave the ice on for 20 minutes, 2-3 times a day.  If directed, apply heat to the affected area. Use the heat source that your health care provider recommends, such as a moist heat pack or a heating pad. ? Place a towel between your skin and the heat source. ? Leave the heat on for 20-30 minutes. ? Remove the heat if your skin turns bright red. This is especially important if you are unable to feel pain, heat, or cold. You may have a greater risk of getting burned. Activity   Return  to your normal activities as told by your health care provider. Ask your health care provider what activities are safe for you.  Avoid activities that make your symptoms worse.  Take brief periods of rest throughout the day. ? When you rest for longer periods, mix in some mild activity or stretching between periods of rest. This will help to prevent stiffness and pain. ? Avoid sitting for long periods of time without moving. Get up and move around at least one time each hour.  Exercise and stretch regularly, as told by your health care provider.  Do not lift anything that is heavier than 10 lb (4.5 kg) while you have symptoms of sciatica. When you do not have symptoms, you should still avoid heavy lifting, especially repetitive heavy lifting.  When you lift objects, always use proper lifting technique, which includes: ? Bending your knees. ? Keeping the load close  to your body. ? Avoiding twisting. General instructions  Maintain a healthy weight. Excess weight puts extra stress on your back.  Wear supportive, comfortable shoes. Avoid wearing high heels.  Avoid sleeping on a mattress that is too soft or too hard. A mattress that is firm enough to support your back when you sleep may help to reduce your pain.  Keep all follow-up visits as told by your health care provider. This is important. Contact a health care provider if:  You have pain that: ? Wakes you up when you are sleeping. ? Gets worse when you lie down. ? Is worse than you have experienced in the past. ? Lasts longer than 4 weeks.  You have an unexplained weight loss. Get help right away if:  You are not able to control when you urinate or have bowel movements (incontinence).  You have: ? Weakness in your lower back, pelvis, buttocks, or legs that gets worse. ? Redness or swelling of your back. ? A burning sensation when you urinate. Summary  Sciatica is pain, numbness, weakness, or tingling along the path of the sciatic nerve.  This condition is caused by pressure on the sciatic nerve or pinching of the nerve.  Sciatica can cause pain, numbness, or tingling in the lower back, legs, hips, and buttocks.  Treatment often includes rest, exercise, medicines, and applying ice or heat. This information is not intended to replace advice given to you by your health care provider. Make sure you discuss any questions you have with your health care provider. Document Revised: 11/17/2018 Document Reviewed: 11/17/2018 Elsevier Patient Education  [place].

## 2020-05-23 NOTE — Progress Notes (Signed)
Date:  05/23/2020   Name:  Bianca Hawkins   DOB:  01/25/1946   MRN:  [id number]   Chief Complaint: Hip Pain (R) butt cheek and radiates around hip into R) leg to knee)  Hip Pain  The incident occurred 3 to 5 days ago (Thursday). Injury mechanism: did a lot of walking. The pain is present in the right thigh and right hip. The quality of the pain is described as aching. The pain is at a severity of 7/10. The pain is moderate. The pain has been fluctuating since onset. Pertinent negatives include no inability to bear weight, numbness or tingling. Nothing aggravates the symptoms. She has tried NSAIDs, acetaminophen and ice for the symptoms. The treatment provided moderate relief.    Lab Results  Component Value Date   CREATININE 0.7 12/11/2017   BUN 18 12/11/2017   NA 138 12/11/2017   K 6.0 (A) 12/11/2017   CL 101 10/15/2016   CO2 32 10/15/2016   Lab Results  Component Value Date   CHOL 157 09/25/2016   HDL 63 09/25/2016   LDLCALC 71 09/25/2016   TRIG 114 09/25/2016   CHOLHDL 2.5 09/25/2016   Lab Results  Component Value Date   TSH 0.55 12/11/2017   Lab Results  Component Value Date   HGBA1C 7.3 11/18/2019   Lab Results  Component Value Date   WBC 7.8 09/01/2019   HGB 12.2 09/01/2019   HCT 37.0 09/01/2019   MCV 87.3 09/01/2019   PLT 135 (L) 09/01/2019   Lab Results  Component Value Date   ALT 24 10/15/2016   AST 29 10/15/2016   ALKPHOS 50 10/15/2016   BILITOT 0.4 10/15/2016     Review of Systems  Constitutional: Negative.  Negative for chills, fatigue, fever and unexpected weight change.  HENT: Negative for congestion, ear discharge, ear pain, rhinorrhea, sinus pressure, sneezing and sore throat.   Eyes: Negative for photophobia, pain, discharge, redness and itching.  Respiratory: Negative for cough, shortness of breath, wheezing and stridor.   Cardiovascular: Negative for chest pain, palpitations and leg swelling.  Gastrointestinal: Negative for  abdominal pain, blood in stool, constipation, diarrhea, nausea and vomiting.  Endocrine: Negative for cold intolerance, heat intolerance, polydipsia, polyphagia and polyuria.  Genitourinary: Negative for dysuria, flank pain, frequency, hematuria, menstrual problem, pelvic pain, urgency, vaginal bleeding and vaginal discharge.  Musculoskeletal: Negative for arthralgias, back pain and myalgias.  Skin: Negative for rash.  Allergic/Immunologic: Negative for environmental allergies and food allergies.  Neurological: Negative for dizziness, tingling, weakness, light-headedness, numbness and headaches.  Hematological: Negative for adenopathy. Does not bruise/bleed easily.  Psychiatric/Behavioral: Negative for dysphoric mood. The patient is not nervous/anxious.     Patient Active Problem List   Diagnosis Date Noted  . Pure hypercholesterolemia 09/24/2018  . History of biliary T-tube placement 01/22/2018  . Recurrent major depressive episodes ([place]) 01/22/2018  . Age-related osteoporosis without current pathological fracture 09/09/2017  . Hyperlipidemia due to type 2 diabetes mellitus ([place]) 09/09/2017  . Panic attacks 04/16/2017  . Hyperparathyroidism, primary ([place]) 03/01/2017  . Multinodular goiter 03/01/2017  . Essential hypertension 06/28/2016  . Chronic anxiety 06/28/2016  . Type 2 diabetes mellitus without complication, without long-term current use of insulin ([place]) 08/09/2015  . Familial multiple lipoprotein-type hyperlipidemia 08/09/2015  . Pain in joint 08/09/2015  . Encounter for general adult medical examination without abnormal findings 08/09/2015  . Chronic obstructive pulmonary disease (COPD) ([place]) 08/09/2015  . H/O: depression 08/09/2015  . Gastroesophageal reflux disease without  esophagitis 08/09/2015  . Blood glucose elevated 08/09/2015  . Encounter for screening for osteoporosis 08/09/2015  . Need for vaccination 08/09/2015  . Chronic MEE (middle ear effusion) 08/09/2015  .  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 Other Shortness Of Breath    Pepper  . Eggs Or Egg-Derived Products   . Sulfa Antibiotics     Past Surgical History:  Procedure Laterality Date  . BIOPSY BREAST     benign  . BREAST BIOPSY Right 2014   core with clips;benign per pt  . COLONOSCOPY  2008   cleared for 10 years/ occults- 07/22/2015- negative  . INNER EAR SURGERY     benign tumor behind eardrum  . PARATHYROID EXPLORATION     nodule removed  . THYROID EXPLORATION  04/2017   nodule removed    Social History   Tobacco Use  . Smoking status: Former Smoker    Packs/day: 0.50    Years: 30.00    Pack years: 15.00    Types: Cigarettes    Quit date: 2003    Years since quitting: 18.5  . Smokeless tobacco: Never Used  Vaping Use  . Vaping Use: Never used  Substance Use Topics  . Alcohol use: No    Alcohol/week: 0.0 standard drinks  . Drug use: No     Medication list has been reviewed and updated.  Current Meds  Medication Sig  . ACCU-CHEK AVIVA PLUS test strip USE AS DIRECTED  . aspirin 81 MG tablet Take 1 tablet by mouth daily.  . busPIRone (BUSPAR) 5 MG tablet Take 1 tablet (5 mg total) by mouth 2 (two) times daily. (Patient taking differently: Take 5 mg by mouth as needed. )  . cetirizine (ZYRTEC) 5 MG tablet Take 1 tablet by mouth as needed.  . Cholecalciferol (VITAMIN D-1000 MAX ST) 1000 units tablet Take 1,000 mg by mouth daily.  . fluticasone (FLONASE) 50 MCG/ACT nasal spray Place 1 spray into both nostrils as needed.  Marland Kitchen glipiZIDE (GLUCOTROL XL) 5 MG 24 hr tablet Take 5 mg by mouth daily with breakfast. Dr Honor Junes  . linagliptin (TRADJENTA) 5 MG TABS tablet Take 5 mg by mouth daily. Dr Honor Junes  . metFORMIN (GLUCOPHAGE) 1000 MG tablet Take 1 tablet (1,000 mg total) by mouth 2 (two) times daily. (Patient taking differently: Take 1,000 mg by mouth 2 (two) times daily. Dr Honor Junes)  . metoprolol tartrate (LOPRESSOR) 100  MG tablet Take 1 tablet (100 mg total) by mouth 2 (two) times daily.  Marland Kitchen omeprazole (PRILOSEC) 20 MG capsule TAKE 1 CAPSULE BY MOUTH ONCE DAILY  . PARoxetine (PAXIL) 10 MG tablet Take 1 tablet (10 mg total) by mouth daily.  . ramipril (ALTACE) 10 MG capsule Take 1 capsule (10 mg total) by mouth daily.  . simvastatin (ZOCOR) 20 MG tablet Take 1 tablet (20 mg total) by mouth daily.    PHQ 2/9 Scores 05/23/2020 01/14/2020 10/21/2019 07/21/2019  PHQ - 2 Score 0 0 0 0  PHQ- 9 Score 0 3 - 0    GAD 7 : Generalized Anxiety Score 05/23/2020 01/14/2020  Nervous, Anxious, on Edge 0 3  Control/stop worrying 0 0  Worry too much - different things 0 0  Trouble relaxing 0 0  Restless 0 0  Easily annoyed or irritable 0 0  Afraid - awful might happen 0 0  Total GAD 7 Score 0 3  Anxiety Difficulty - Not difficult at all    BP  Readings from Last 3 Encounters:  05/23/20 120/80  01/14/20 138/88  09/01/19 122/83    Physical Exam Vitals and nursing note reviewed.  Constitutional:      General: She is not in acute distress.    Appearance: She is not diaphoretic.  HENT:     Head: Normocephalic and atraumatic.     Right Ear: External ear normal.     Left Ear: External ear normal.     Nose: Nose normal.  Eyes:     General:        Right eye: No discharge.        Left eye: No discharge.     Conjunctiva/sclera: Conjunctivae normal.     Pupils: Pupils are equal, round, and reactive to light.  Neck:     Thyroid: No thyromegaly.     Vascular: No JVD.  Cardiovascular:     Rate and Rhythm: Normal rate and regular rhythm.     Heart sounds: Normal heart sounds. No murmur heard.  No friction rub. No gallop.   Pulmonary:     Effort: Pulmonary effort is normal.     Breath sounds: Normal breath sounds.  Abdominal:     General: Bowel sounds are normal.     Palpations: Abdomen is soft. There is no mass.     Tenderness: There is no abdominal tenderness. There is no guarding.  Musculoskeletal:        General:  Normal range of motion.     Cervical back: Normal range of motion and neck supple.     Lumbar back: No swelling, deformity, spasms or tenderness. Negative right straight leg raise test.     Right hip: No tenderness or bony tenderness. Normal range of motion. Normal strength.     Right upper leg: Tenderness present. No swelling or bony tenderness.       Legs:     Comments: Initial pain ischial tuberosity/nontender now  Lymphadenopathy:     Cervical: No cervical adenopathy.  Skin:    General: Skin is warm and dry.  Neurological:     Mental Status: She is alert.     Deep Tendon Reflexes: Reflexes are normal and symmetric.     Wt Readings from Last 3 Encounters:  05/23/20 179 lb (81.2 kg)  01/14/20 181 lb (82.1 kg)  10/21/19 177 lb (80.3 kg)    BP 120/80   Pulse 68   Ht '5\' 8"'$  (1.727 m)   Wt 179 lb (81.2 kg)   BMI 27.22 kg/m   Assessment and Plan: 1. Iliotibial band syndrome of right side Acute.  Persistent.  Stable pending activity.  Exam notes tenderness along the lateral aspect of the right femur extending to the knee.  There is no pain with internal or external rotation of the hip no movement noted of the knee nor SLE.  Reflects most likely iliotibial band.  Will initiate meloxicam 15 mg once a day. - meloxicam (MOBIC) 15 MG tablet; Take 1 tablet (15 mg total) by mouth daily.  Dispense: 30 tablet; Refill: 1  2. Colon cancer screening Discussed with patient.  Patient is willing to do any FIT and information and kit has been distributed. - Fecal occult blood, imunochemical([facility])

## 2020-05-26 DIAGNOSIS — E785 Hyperlipidemia, unspecified: Secondary | ICD-10-CM | POA: Diagnosis not present

## 2020-05-26 DIAGNOSIS — E1159 Type 2 diabetes mellitus with other circulatory complications: Secondary | ICD-10-CM | POA: Diagnosis not present

## 2020-05-26 DIAGNOSIS — E1169 Type 2 diabetes mellitus with other specified complication: Secondary | ICD-10-CM | POA: Diagnosis not present

## 2020-05-26 DIAGNOSIS — I152 Hypertension secondary to endocrine disorders: Secondary | ICD-10-CM | POA: Diagnosis not present

## 2020-05-26 DIAGNOSIS — M810 Age-related osteoporosis without current pathological fracture: Secondary | ICD-10-CM | POA: Diagnosis not present

## 2020-05-26 LAB — HEMOGLOBIN A1C: Hemoglobin A1C: 6.8

## 2020-05-27 DIAGNOSIS — E1169 Type 2 diabetes mellitus with other specified complication: Secondary | ICD-10-CM | POA: Diagnosis not present

## 2020-05-27 DIAGNOSIS — E785 Hyperlipidemia, unspecified: Secondary | ICD-10-CM | POA: Diagnosis not present

## 2020-05-27 DIAGNOSIS — E1159 Type 2 diabetes mellitus with other circulatory complications: Secondary | ICD-10-CM | POA: Diagnosis not present

## 2020-05-27 DIAGNOSIS — I152 Hypertension secondary to endocrine disorders: Secondary | ICD-10-CM | POA: Diagnosis not present

## 2020-06-01 ENCOUNTER — Telehealth: Payer: Self-pay

## 2020-06-01 NOTE — Telephone Encounter (Signed)
I spoke to pt about the FIT test sent out with her on the 12th- she will be turning it in this Friday she stated

## 2020-06-03 DIAGNOSIS — Z1211 Encounter for screening for malignant neoplasm of colon: Secondary | ICD-10-CM | POA: Diagnosis not present

## 2020-06-04 LAB — FECAL OCCULT BLOOD, IMMUNOCHEMICAL: Fecal Occult Bld: NEGATIVE

## 2020-06-09 DIAGNOSIS — E042 Nontoxic multinodular goiter: Secondary | ICD-10-CM | POA: Diagnosis not present

## 2020-06-22 DIAGNOSIS — Z03818 Encounter for observation for suspected exposure to other biological agents ruled out: Secondary | ICD-10-CM | POA: Diagnosis not present

## 2020-06-22 DIAGNOSIS — Z20822 Contact with and (suspected) exposure to covid-19: Secondary | ICD-10-CM | POA: Diagnosis not present

## 2020-06-23 DIAGNOSIS — I152 Hypertension secondary to endocrine disorders: Secondary | ICD-10-CM | POA: Diagnosis not present

## 2020-06-23 DIAGNOSIS — E1169 Type 2 diabetes mellitus with other specified complication: Secondary | ICD-10-CM | POA: Diagnosis not present

## 2020-06-23 DIAGNOSIS — E785 Hyperlipidemia, unspecified: Secondary | ICD-10-CM | POA: Diagnosis not present

## 2020-06-23 DIAGNOSIS — E1159 Type 2 diabetes mellitus with other circulatory complications: Secondary | ICD-10-CM | POA: Diagnosis not present

## 2020-06-23 DIAGNOSIS — E119 Type 2 diabetes mellitus without complications: Secondary | ICD-10-CM | POA: Diagnosis not present

## 2020-06-23 DIAGNOSIS — M810 Age-related osteoporosis without current pathological fracture: Secondary | ICD-10-CM | POA: Diagnosis not present

## 2020-07-07 DIAGNOSIS — Z872 Personal history of diseases of the skin and subcutaneous tissue: Secondary | ICD-10-CM | POA: Diagnosis not present

## 2020-07-07 DIAGNOSIS — L578 Other skin changes due to chronic exposure to nonionizing radiation: Secondary | ICD-10-CM | POA: Diagnosis not present

## 2020-07-21 ENCOUNTER — Encounter: Payer: Self-pay | Admitting: Family Medicine

## 2020-07-22 ENCOUNTER — Other Ambulatory Visit: Payer: Self-pay

## 2020-07-22 ENCOUNTER — Ambulatory Visit: Payer: Self-pay | Admitting: Family Medicine

## 2020-07-22 ENCOUNTER — Encounter: Payer: Self-pay | Admitting: Family Medicine

## 2020-07-22 ENCOUNTER — Ambulatory Visit: Payer: Medicare PPO | Admitting: Family Medicine

## 2020-07-22 VITALS — BP 122/80 | HR 78 | Ht 68.0 in | Wt 177.0 lb

## 2020-07-22 DIAGNOSIS — M7662 Achilles tendinitis, left leg: Secondary | ICD-10-CM

## 2020-07-22 MED ORDER — MELOXICAM 15 MG PO TABS: 15.0000 mg | ORAL_TABLET | Freq: Every day | ORAL | 2 refills | Status: DC

## 2020-07-22 MED ORDER — DICLOFENAC SODIUM 1 % EX GEL: 2.0000 g | Freq: Four times a day (QID) | CUTANEOUS | 0 refills | Status: DC

## 2020-07-22 NOTE — Patient Instructions (Signed)
[employer]'s Emergency Medicine: Concepts and Clinical Practice (9th ed., pp. 1392-1401). [place], [place]: [employer]. Retrieved from https://www.clinicalkey.com/#!/content/[email or web address]">  Achilles Tendinitis  Achilles tendinitis is inflammation of the tough, cord-like band that attaches the lower leg muscles to the heel bone (Achilles tendon). This is usually caused by overusing the tendon and the ankle joint. Achilles tendinitis usually gets better over time with treatment and caring for yourself at home. It can take weeks or months to heal completely. What are the causes? This condition may be caused by:  A sudden increase in exercise or activity, such as running.  Doing the same exercises or activities, such as jumping, over and over.  Not warming up calf muscles before exercising.  Exercising in shoes that are worn out or not made for exercise.  Having arthritis or a bone growth (spur) on the back of the heel bone. This can rub against the tendon and hurt it.  Age-related wear and tear. Tendons become less flexible with age and are more likely to be injured. What are the signs or symptoms? Common symptoms of this condition include:  Pain in the Achilles tendon or in the back of the leg, just above the heel. The pain usually gets worse with exercise.  Stiffness or soreness in the back of the leg, especially in the morning.  Swelling of the skin over the Achilles tendon.  Thickening of the tendon.  Trouble standing on tiptoe. How is this diagnosed? This condition is diagnosed based on your symptoms and a physical exam. You may have tests, including:  X-rays.  MRI. How is this treated? The goal of treatment is to relieve symptoms and help your injury heal. Treatment may include:  Decreasing or stopping activities that caused the tendinitis. This may mean switching to low-impact exercises like biking or swimming.  Icing the injured  area.  Doing physical therapy, including strengthening and stretching exercises.  Taking NSAIDs, such as ibuprofen, to help relieve pain and swelling.  Using supportive shoes, wraps, heel lifts, or a walking boot (air cast).  Having surgery. This may be done if your symptoms do not improve after other treatments.  Using high-energy shock wave impulses to stimulate the healing process (extracorporeal shock wave therapy). This is rare.  Having an injection of medicines that help relieve inflammation (corticosteroids). This is rare. Follow these instructions at home: If you have an air cast:  Wear the air cast as told by your health care provider. Remove it only as told by your health care provider.  Loosen it if your toes tingle, become numb, or turn cold and blue.  Keep it clean.  If the air cast is not waterproof: ? Do not let it get wet. ? Cover it with a watertight covering when you take a bath or shower. Managing pain, stiffness, and swelling   If directed, put ice on the injured area. To do this: ? If you have a removable air cast, remove it as told by your health care provider. ? Put ice in a plastic bag. ? Place a towel between your skin and the bag. ? Leave the ice on for 20 minutes, 2-3 times a day.  Move your toes often to reduce stiffness and swelling.  Raise (elevate) your foot above the level of your heart while you are sitting or lying down. Activity  Gradually return to your normal activities as told by your health care provider. Ask your health care provider what activities are safe for you.  Do not do   activities that cause pain.  Consider doing low-impact exercises, like cycling or swimming.  Ask your health care provider when it is safe to drive if you have an air cast on your foot.  If physical therapy was prescribed, do exercises as told by your health care provider or physical therapist. General instructions  If directed, wrap your foot with an  elastic bandage or other wrap. This can help to keep your tendon from moving too much while it heals. Your health care provider will show you how to wrap your foot correctly.  Wear supportive shoes or heel lifts only as told by your health care provider.  Take over-the-counter and prescription medicines only as told by your health care provider.  Keep all follow-up visits as told by your health care provider. This is important. Contact a health care provider if you:  Have symptoms that get worse.  Have pain that does not get better with medicine.  Develop new, unexplained symptoms.  Develop warmth and swelling in your foot.  Have a fever. Get help right away if you:  Have a sudden popping sound or sensation in your Achilles tendon followed by severe pain.  Cannot move your toes or foot.  Cannot put any weight on your foot.  Your foot or toes become numb and look white or blue even after loosening your bandage or air cast. Summary  Achilles tendinitis is inflammation of the tough, cord-like band that attaches the lower leg muscles to the heel bone (Achilles tendon).  This condition is usually caused by overusing the tendon and the ankle joint. It can also be caused by arthritis or normal aging.  The most common symptoms of this condition include pain, swelling, or stiffness in the Achilles tendon or in the back of the leg.  This condition is usually treated by decreasing or stopping activities that caused the tendinitis, icing the injured area, taking NSAIDs, and doing physical therapy. This information is not intended to replace advice given to you by your health care provider. Make sure you discuss any questions you have with your health care provider. Document Revised: 03/16/2019 Document Reviewed: 03/16/2019 Elsevier Patient Education  2020 [employer]  

## 2020-07-22 NOTE — Progress Notes (Signed)
Date:  07/22/2020   Name:  Bianca Hawkins   DOB:  06-08-1946   MRN:  [id number]   Chief Complaint: Ankle Pain (L) ankle pain and swelling during the day- started after driving to [facility]- drove with L) foot tensed up)  Ankle Pain  The incident occurred 3 to 5 days ago. The pain is present in the left heel and left ankle. The quality of the pain is described as aching. The pain is mild. The pain has been improving since onset. Pertinent negatives include no numbness. The symptoms are aggravated by weight bearing, palpation and movement. She has tried NSAIDs for the symptoms. The treatment provided mild relief.    Lab Results  Component Value Date   CREATININE 0.7 12/11/2017   BUN 18 12/11/2017   NA 138 12/11/2017   K 6.0 (A) 12/11/2017   CL 101 10/15/2016   CO2 32 10/15/2016   Lab Results  Component Value Date   CHOL 157 09/25/2016   HDL 63 09/25/2016   LDLCALC 71 09/25/2016   TRIG 114 09/25/2016   CHOLHDL 2.5 09/25/2016   Lab Results  Component Value Date   TSH 0.55 12/11/2017   Lab Results  Component Value Date   HGBA1C 6.8 05/26/2020   Lab Results  Component Value Date   WBC 7.8 09/01/2019   HGB 12.2 09/01/2019   HCT 37.0 09/01/2019   MCV 87.3 09/01/2019   PLT 135 (L) 09/01/2019   Lab Results  Component Value Date   ALT 24 10/15/2016   AST 29 10/15/2016   ALKPHOS 50 10/15/2016   BILITOT 0.4 10/15/2016     Review of Systems  Constitutional: Negative.  Negative for chills, fatigue, fever and unexpected weight change.  HENT: Negative for congestion, ear discharge, ear pain, rhinorrhea, sinus pressure, sneezing and sore throat.   Eyes: Negative for photophobia, pain, discharge, redness and itching.  Respiratory: Negative for cough, shortness of breath, wheezing and stridor.   Gastrointestinal: Negative for abdominal pain, blood in stool, constipation, diarrhea, nausea and vomiting.  Endocrine: Negative for cold intolerance, heat intolerance, polydipsia,  polyphagia and polyuria.  Genitourinary: Negative for dysuria, flank pain, frequency, hematuria, menstrual problem, pelvic pain, urgency, vaginal bleeding and vaginal discharge.  Musculoskeletal: Negative for arthralgias, back pain and myalgias.  Skin: Negative for rash.  Allergic/Immunologic: Negative for environmental allergies and food allergies.  Neurological: Negative for dizziness, weakness, light-headedness, numbness and headaches.  Hematological: Negative for adenopathy. Does not bruise/bleed easily.  Psychiatric/Behavioral: Negative for dysphoric mood. The patient is not nervous/anxious.     Patient Active Problem List   Diagnosis Date Noted  . Pure hypercholesterolemia 09/24/2018  . History of biliary T-tube placement 01/22/2018  . Recurrent major depressive episodes ([place]) 01/22/2018  . Age-related osteoporosis without current pathological fracture 09/09/2017  . Hyperlipidemia due to type 2 diabetes mellitus ([place]) 09/09/2017  . Panic attacks 04/16/2017  . Hyperparathyroidism, primary ([place]) 03/01/2017  . Multinodular goiter 03/01/2017  . Essential hypertension 06/28/2016  . Chronic anxiety 06/28/2016  . Type 2 diabetes mellitus without complication, without long-term current use of insulin ([place]) 08/09/2015  . Familial multiple lipoprotein-type hyperlipidemia 08/09/2015  . Pain in joint 08/09/2015  . Encounter for general adult medical examination without abnormal findings 08/09/2015  . Chronic obstructive pulmonary disease (COPD) ([place]) 08/09/2015  . H/O: depression 08/09/2015  . Gastroesophageal reflux disease without esophagitis 08/09/2015  . Blood glucose elevated 08/09/2015  . Encounter for screening for osteoporosis 08/09/2015  . Need for vaccination 08/09/2015  .  Chronic MEE (middle ear effusion) 08/09/2015  . 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 Other Shortness Of Breath    Pepper  . Eggs Or Egg-Derived Products   . Sulfa  Antibiotics     Past Surgical History:  Procedure Laterality Date  . BIOPSY BREAST     benign  . BREAST BIOPSY Right 2014   core with clips;benign per pt  . COLONOSCOPY  2008   cleared for 10 years/ occults- 07/22/2015- negative  . INNER EAR SURGERY     benign tumor behind eardrum  . PARATHYROID EXPLORATION     nodule removed  . THYROID EXPLORATION  04/2017   nodule removed    Social History   Tobacco Use  . Smoking status: Former Smoker    Packs/day: 0.50    Years: 30.00    Pack years: 15.00    Types: Cigarettes    Quit date: 2003    Years since quitting: 18.7  . Smokeless tobacco: Never Used  Vaping Use  . Vaping Use: Never used  Substance Use Topics  . Alcohol use: No    Alcohol/week: 0.0 standard drinks  . Drug use: No     Medication list has been reviewed and updated.  Current Meds  Medication Sig  . ACCU-CHEK AVIVA PLUS test strip USE AS DIRECTED  . aspirin 81 MG tablet Take 1 tablet by mouth daily.  . busPIRone (BUSPAR) 5 MG tablet Take 1 tablet (5 mg total) by mouth 2 (two) times daily. (Patient taking differently: Take 5 mg by mouth as needed. )  . cetirizine (ZYRTEC) 5 MG tablet Take 1 tablet by mouth as needed.  . Cholecalciferol (VITAMIN D-1000 MAX ST) 1000 units tablet Take 1,000 mg by mouth daily.  . fluticasone (FLONASE) 50 MCG/ACT nasal spray Place 1 spray into both nostrils as needed.  Marland Kitchen glipiZIDE (GLUCOTROL XL) 5 MG 24 hr tablet Take 5 mg by mouth daily with breakfast. Dr Honor Junes  . linagliptin (TRADJENTA) 5 MG TABS tablet Take 5 mg by mouth daily. Dr Honor Junes  . meloxicam (MOBIC) 15 MG tablet Take 1 tablet (15 mg total) by mouth daily.  . metFORMIN (GLUCOPHAGE) 1000 MG tablet Take 1 tablet (1,000 mg total) by mouth 2 (two) times daily. (Patient taking differently: Take 1,000 mg by mouth 2 (two) times daily. Dr Honor Junes)  . metoprolol tartrate (LOPRESSOR) 100 MG tablet Take 1 tablet (100 mg total) by mouth 2 (two) times daily.  Marland Kitchen omeprazole  (PRILOSEC) 20 MG capsule TAKE 1 CAPSULE BY MOUTH ONCE DAILY  . PARoxetine (PAXIL) 10 MG tablet Take 1 tablet (10 mg total) by mouth daily.  . ramipril (ALTACE) 10 MG capsule Take 1 capsule (10 mg total) by mouth daily.  . simvastatin (ZOCOR) 20 MG tablet Take 1 tablet (20 mg total) by mouth daily.    PHQ 2/9 Scores 07/22/2020 05/23/2020 01/14/2020 10/21/2019  PHQ - 2 Score 0 0 0 0  PHQ- 9 Score 0 0 3 -    GAD 7 : Generalized Anxiety Score 07/22/2020 05/23/2020 01/14/2020  Nervous, Anxious, on Edge 0 0 3  Control/stop worrying 0 0 0  Worry too much - different things 0 0 0  Trouble relaxing 0 0 0  Restless 0 0 0  Easily annoyed or irritable 0 0 0  Afraid - awful might happen 0 0 0  Total GAD 7 Score 0 0 3  Anxiety Difficulty - - Not difficult at all  BP Readings from Last 3 Encounters:  07/22/20 122/80  05/23/20 120/80  01/14/20 138/88    Physical Exam Vitals and nursing note reviewed.  Constitutional:      Appearance: She is well-developed.  HENT:     Head: Normocephalic.     Right Ear: Tympanic membrane, ear canal and external ear normal. There is no impacted cerumen.     Left Ear: Tympanic membrane, ear canal and external ear normal. There is no impacted cerumen.     Nose: Nose normal. No congestion or rhinorrhea.     Mouth/Throat:     Mouth: Mucous membranes are moist.  Eyes:     General: Lids are everted, no foreign bodies appreciated. No scleral icterus.       Left eye: No foreign body or hordeolum.     Conjunctiva/sclera: Conjunctivae normal.     Right eye: Right conjunctiva is not injected.     Left eye: Left conjunctiva is not injected.     Pupils: Pupils are equal, round, and reactive to light.  Neck:     Thyroid: No thyromegaly.     Vascular: No JVD.     Trachea: No tracheal deviation.  Cardiovascular:     Rate and Rhythm: Normal rate and regular rhythm.     Heart sounds: Normal heart sounds. No murmur heard.  No friction rub. No gallop.   Pulmonary:      Effort: Pulmonary effort is normal. No respiratory distress.     Breath sounds: Normal breath sounds. No stridor. No wheezing, rhonchi or rales.  Chest:     Chest wall: No tenderness.  Abdominal:     General: Bowel sounds are normal.     Palpations: Abdomen is soft. There is no mass.     Tenderness: There is no abdominal tenderness. There is no right CVA tenderness, left CVA tenderness, guarding or rebound.  Musculoskeletal:        General: No tenderness. Normal range of motion.     Cervical back: Normal range of motion and neck supple.  Lymphadenopathy:     Cervical: No cervical adenopathy.  Skin:    General: Skin is warm.     Coloration: Skin is not jaundiced or pale.     Findings: No bruising, erythema, lesion or rash.  Neurological:     Mental Status: She is alert and oriented to person, place, and time.     Cranial Nerves: No cranial nerve deficit.     Deep Tendon Reflexes: Reflexes normal.  Psychiatric:        Mood and Affect: Mood is not anxious or depressed.     Wt Readings from Last 3 Encounters:  07/22/20 177 lb (80.3 kg)  05/23/20 179 lb (81.2 kg)  01/14/20 181 lb (82.1 kg)    BP 122/80   Pulse 78   Ht 5\' 8"  (1.727 m)   Wt 177 lb (80.3 kg)   BMI 26.91 kg/m   Assessment and Plan: 1. Achilles tendinitis of left lower extremity Acute.  Persistent.  Gradually improving since patient has started her on meloxicam but patient has a short supply at this needs tomorrow if we are to continue.  We will continue meloxicam 15 mg once a day but have also suggested that she may want to pick up some over-the-counter Voltaren gel to use once to twice a day prior to activity specifically on the area of concern. - meloxicam (MOBIC) 15 MG tablet; Take 1 tablet (15 mg total) by mouth daily.  Dispense: 30 tablet; Refill: 2

## 2020-08-02 DIAGNOSIS — M810 Age-related osteoporosis without current pathological fracture: Secondary | ICD-10-CM | POA: Diagnosis not present

## 2020-08-09 ENCOUNTER — Other Ambulatory Visit: Payer: Self-pay | Admitting: Family Medicine

## 2020-08-09 DIAGNOSIS — F419 Anxiety disorder, unspecified: Secondary | ICD-10-CM

## 2020-08-09 DIAGNOSIS — E7800 Pure hypercholesterolemia, unspecified: Secondary | ICD-10-CM

## 2020-08-09 DIAGNOSIS — K219 Gastro-esophageal reflux disease without esophagitis: Secondary | ICD-10-CM

## 2020-08-09 NOTE — Telephone Encounter (Signed)
Requested medication (s) are due for refill today - yes  Requested medication (s) are on the active medication list -yes  Future visit scheduled -no  Last refill: 05/05/20  Notes to clinic: Patient fails lab protocol- 2017- Sent for PCP review   Requested Prescriptions  Pending Prescriptions Disp Refills   simvastatin (ZOCOR) 20 MG tablet [Pharmacy Med Name: SIMVASTATIN 20 MG TAB] 90 tablet 1    Sig: TAKE ONE TABLET EVERY DAY      Cardiovascular:  Antilipid - Statins Failed - 08/09/2020  2:03 PM      Failed - Total Cholesterol in normal range and within 360 days    Cholesterol, Total  Date Value Ref Range Status  09/25/2016 157 100 - 199 mg/dL Final          Failed - LDL in normal range and within 360 days    LDL Calculated  Date Value Ref Range Status  09/25/2016 71 0 - 99 mg/dL Final          Failed - HDL in normal range and within 360 days    HDL  Date Value Ref Range Status  09/25/2016 63 >39 mg/dL Final          Failed - Triglycerides in normal range and within 360 days    Triglycerides  Date Value Ref Range Status  09/25/2016 114 0 - 149 mg/dL Final          Passed - Patient is not pregnant      Passed - Valid encounter within last 12 months    Recent Outpatient Visits           2 weeks ago Achilles tendinitis of left lower extremity   [facility] Juline Patch, [facility]   2 months ago Iliotibial band syndrome of right side   [facility] Jul[facility], [facility]   6 months ago Dupuytren's contracture   [facility] Juline Patch, [facility]   11 months ago Essential hypertension   [facility] Jul[facility], [facility]   1 year ago Essential hypertension   [facility] Juline Patch, MD               Signed Prescriptions Disp Refills   omeprazole (PRILOSEC) 20 MG capsule 90 capsule 0    Sig: TAKE 1 CAPSULE EVERY DAY      Gastroenterology: Proton Pump Inhibitors Passed - 08/09/2020  2:03 PM      Passed - Valid  encounter within last 12 months    Recent Outpatient Visits           2 weeks ago Achilles tendinitis of left lower extremity   [facility], [facility]   2 months ago Iliotibial band syndrome of right side   [facility] Juline Patch, [facility]   6 months ago Dupuytren's contracture   [facility] Juline Patch, MD   11 months ago Essential hypertension   [facility] Juline Patch, [facility]   1 year ago Essential hypertension   [facility] Juline Patch, MD                PARoxetine (PAXIL) 10 MG tablet 90 tablet 0    Sig: TAKE ONE TABLET EVERY DAY      Psychiatry:  Antidepressants - SSRI Passed - 08/09/2020  2:03 PM      Passed - Completed PHQ-2 or PHQ-9 in the last 360  days.      Passed - Valid encounter within last 6 months    Recent Outpatient Visits           2 weeks ago Achilles tendinitis of left lower extremity   [facility] Juline Patch, [facility]   2 months ago Iliotibial band syndrome of right side   [facility] Juline Patch, MD   6 months ago Dupuytren's contracture   [facility] Juline Patch, MD   11 months ago Essential hypertension   [facility] Jul[facility] Patch, [facility]   1 year ago Essential hypertension   [facility] Juline Patch, MD                  Requested Prescriptions  Pending Prescriptions Disp Refills   simvastatin (ZOCOR) 20 MG tablet [Pharmacy Med Name: SIMVASTATIN 20 MG TAB] 90 tablet 1    Sig: TAKE ONE TABLET EVERY DAY      Cardiovascular:  Antilipid - Statins Failed - 08/09/2020  2:03 PM      Failed - Total Cholesterol in normal range and within 360 days    Cholesterol, Total  Date Value Ref Range Status  09/25/2016 157 100 - 199 mg/dL Final          Failed - LDL in normal range and within 360 days    LDL Calculated  Date Value Ref Range Status  09/25/2016 71 0 - 99 mg/dL Final          Failed - HDL in normal range  and within 360 days    HDL  Date Value Ref Range Status  09/25/2016 63 >39 mg/dL Final          Failed - Triglycerides in normal range and within 360 days    Triglycerides  Date Value Ref Range Status  09/25/2016 114 0 - 149 mg/dL Final          Passed - Patient is not pregnant      Passed - Valid encounter within last 12 months    Recent Outpatient Visits           2 weeks ago Achilles tendinitis of left lower extremity   [facility] Juline Patch, [facility]   2 months ago Iliotibial band syndrome of right side   [facility] Jul[facility] Patch, [facility]   6 months ago Dupuytren's contracture   [facility] Juline Patch, MD   11 months ago Essential hypertension   [facility] Jul[facility] Patch, [facility]   1 year ago Essential hypertension   [facility] Juline Patch, MD               Signed Prescriptions Disp Refills   omeprazole (PRILOSEC) 20 MG capsule 90 capsule 0    Sig: TAKE [place]      Gastroenterology: Proton Pump Inhibitors Passed - 08/09/2020  2:03 PM      Passed - Valid encounter within last 12 months    Recent Outpatient Visits           2 weeks ago Achilles tendinitis of left lower extremity   [facility] Jul[facility] Patch, MD   2 months ago Iliotibial band syndrome of right side   [facility] Jul[facility] Patch, MD   6 months ago Dupuytren's contracture   [facility] Juline Patch, MD   11 months ago Essential hypertension  [facility] Jul[facility] Patch, [facility]   1 year ago Essential hypertension   [facility] Juline Patch, MD                PARoxetine (PAXIL) 10 MG tablet 90 tablet 0    Sig: TAKE ONE TABLET EVERY DAY      Psychiatry:  Antidepressants - SSRI Passed - 08/09/2020  2:03 PM      Passed - Completed PHQ-2 or PHQ-9 in the last 360 days.      Passed - Valid encounter within last 6 months    Recent Outpatient Visits           2 weeks  ago Achilles tendinitis of left lower extremity   [facility] Patch, [facility]   2 months ago Iliotibial band syndrome of right side   [facility] Jul[facility] Patch, MD   6 months ago Dupuytren's contracture   [facility] Juline Patch, MD   11 months ago Essential hypertension   [facility] Jul[facility] Patch, [facility]   1 year ago Essential hypertension   [facility] Juline Patch, [facility]

## 2020-08-10 DIAGNOSIS — M810 Age-related osteoporosis without current pathological fracture: Secondary | ICD-10-CM | POA: Diagnosis not present

## 2020-09-08 ENCOUNTER — Other Ambulatory Visit: Payer: Self-pay | Admitting: Family Medicine

## 2020-09-08 DIAGNOSIS — I10 Essential (primary) hypertension: Secondary | ICD-10-CM

## 2020-09-08 DIAGNOSIS — E7800 Pure hypercholesterolemia, unspecified: Secondary | ICD-10-CM

## 2020-09-08 NOTE — Telephone Encounter (Signed)
Requested Prescriptions  Pending Prescriptions Disp Refills   simvastatin (ZOCOR) 20 MG tablet [Pharmacy Med Name: SIMVASTATIN 20 MG TAB] 90 tablet 0    Sig: TAKE 1 TABLET BY MOUTH DAILY     Cardiovascular:  Antilipid - Statins Failed - 09/08/2020  2:26 PM      Failed - Total Cholesterol in normal range and within 360 days    Cholesterol, Total  Date Value Ref Range Status  09/25/2016 157 100 - 199 mg/dL Final         Failed - LDL in normal range and within 360 days    LDL Calculated  Date Value Ref Range Status  09/25/2016 71 0 - 99 mg/dL Final         Failed - HDL in normal range and within 360 days    HDL  Date Value Ref Range Status  09/25/2016 63 >39 mg/dL Final         Failed - Triglycerides in normal range and within 360 days    Triglycerides  Date Value Ref Range Status  09/25/2016 114 0 - 149 mg/dL Final         Passed - Patient is not pregnant      Passed - Valid encounter within last 12 months    Recent Outpatient Visits          1 month ago Achilles tendinitis of left lower extremity   [facility] Patch, MD   3 months ago Iliotibial band syndrome of right side   [facility] Jul[facility] Patch, [facility]   7 months ago Dupuytren's contracture   [facility] Jul[facility] Patch, [facility]   1 year ago Essential hypertension   [facility] Jul[facility] Patch, MD   1 year ago Essential hypertension   [facility] Jul[facility] Patch, [facility]              metoprolol tartrate (LOPRESSOR) 100 MG tablet [Pharmacy Med Name: METOPROLOL TARTRATE 100 MG TAB] 60 tablet 0    Sig: TAKE ONE TABLET TWICE DAILY     Cardiovascular:  Beta Blockers Passed - 09/08/2020  2:26 PM      Passed - Last BP in normal range    BP Readings from Last 1 Encounters:  07/22/20 122/80         Passed - Last Heart Rate in normal range    Pulse Readings from Last 1 Encounters:  07/22/20 78         Passed - Valid encounter within last 6 months     Recent Outpatient Visits          1 month ago Achilles tendinitis of left lower extremity   [facility] Juline Patch, MD   3 months ago Iliotibial band syndrome of right side   [facility] Juline Patch, MD   7 months ago Dupuytren's contracture   [facility] Jul[facility], [facility]   1 year ago Essential hypertension   [facility] Jul[facility] Patch, [facility]   1 year ago Essential hypertension   [facility] Juline Patch, [facility]             Called left VM to schedule appointment. Last visit one year ago for Hypertension. Given 30 day courtesy refill.  Simvastatin will be routed back to office Labs last completed 2017.

## 2020-09-08 NOTE — Telephone Encounter (Signed)
Requested medication (s) are due for refill today: yes  Requested medication (s) are on the active medication list: yes  Last refill:  08/09/20  #90  Bianca Hawkins  Future visit scheduled: No called left VM to return call to office to schedule  Notes to clinic:  labs out of date    Requested Prescriptions  Pending Prescriptions Disp Refills   simvastatin (ZOCOR) 20 MG tablet [Pharmacy Med Name: SIMVASTATIN 20 MG TAB] 90 tablet 0    Sig: TAKE 1 TABLET BY MOUTH DAILY      Cardiovascular:  Antilipid - Statins Failed - 09/08/2020  2:26 PM      Failed - Total Cholesterol in normal range and within 360 days    Cholesterol, Total  Date Value Ref Range Status  09/25/2016 157 100 - 199 mg/dL Final          Failed - LDL in normal range and within 360 days    LDL Calculated  Date Value Ref Range Status  09/25/2016 71 0 - 99 mg/dL Final          Failed - HDL in normal range and within 360 days    HDL  Date Value Ref Range Status  09/25/2016 63 >39 mg/dL Final          Failed - Triglycerides in normal range and within 360 days    Triglycerides  Date Value Ref Range Status  09/25/2016 114 0 - 149 mg/dL Final          Passed - Patient is not pregnant      Passed - Valid encounter within last 12 months    Recent Outpatient Visits           1 month ago Achilles tendinitis of left lower extremity   [facility] Patch, [facility]   3 months ago Iliotibial band syndrome of right side   [facility] Jul[facility] Patch, MD   7 months ago Dupuytren's contracture   [facility] Jul[facility] Patch, [facility]   1 year ago Essential hypertension   [facility] Jul[facility] Patch, MD   1 year ago Essential hypertension   [facility] Juline Patch, MD               Signed Prescriptions Disp Refills   metoprolol tartrate (LOPRESSOR) 100 MG tablet 60 tablet 0    Sig: TAKE ONE TABLET TWICE DAILY      Cardiovascular:  Beta Blockers Passed -  09/08/2020  2:26 PM      Passed - Last BP in normal range    BP Readings from Last 1 Encounters:  07/22/20 122/80          Passed - Last Heart Rate in normal range    Pulse Readings from Last 1 Encounters:  07/22/20 78          Passed - Valid encounter within last 6 months    Recent Outpatient Visits           1 month ago Achilles tendinitis of left lower extremity   [facility] Jul[facility] Patch, MD   3 months ago Iliotibial band syndrome of right side   [facility] Juline Patch, MD   7 months ago Dupuytren's contracture   [facility] Jul[facility] Patch, MD   1 year ago Essential hypertension   [facility] Juline Patch, MD   1 year ago Essential hypertension  [facility] Juline Patch, MD

## 2020-09-12 ENCOUNTER — Encounter: Payer: Self-pay | Admitting: Family Medicine

## 2020-09-12 ENCOUNTER — Other Ambulatory Visit: Payer: Self-pay | Admitting: Family Medicine

## 2020-09-12 DIAGNOSIS — I10 Essential (primary) hypertension: Secondary | ICD-10-CM

## 2020-10-24 ENCOUNTER — Other Ambulatory Visit: Payer: Self-pay

## 2020-10-24 ENCOUNTER — Ambulatory Visit: Payer: Medicare PPO | Admitting: Family Medicine

## 2020-10-24 ENCOUNTER — Ambulatory Visit (INDEPENDENT_AMBULATORY_CARE_PROVIDER_SITE_OTHER): Payer: Medicare PPO

## 2020-10-24 ENCOUNTER — Encounter: Payer: Self-pay | Admitting: Family Medicine

## 2020-10-24 VITALS — BP 130/86 | HR 80 | Ht 68.0 in | Wt 174.0 lb

## 2020-10-24 DIAGNOSIS — F419 Anxiety disorder, unspecified: Secondary | ICD-10-CM

## 2020-10-24 DIAGNOSIS — K219 Gastro-esophageal reflux disease without esophagitis: Secondary | ICD-10-CM

## 2020-10-24 DIAGNOSIS — Z0000 Encounter for general adult medical examination without abnormal findings: Secondary | ICD-10-CM

## 2020-10-24 DIAGNOSIS — E7800 Pure hypercholesterolemia, unspecified: Secondary | ICD-10-CM | POA: Diagnosis not present

## 2020-10-24 DIAGNOSIS — M7662 Achilles tendinitis, left leg: Secondary | ICD-10-CM

## 2020-10-24 DIAGNOSIS — I10 Essential (primary) hypertension: Secondary | ICD-10-CM | POA: Diagnosis not present

## 2020-10-24 MED ORDER — OMEPRAZOLE 20 MG PO CPDR: DELAYED_RELEASE_CAPSULE | ORAL | 1 refills | Status: DC

## 2020-10-24 MED ORDER — RAMIPRIL 10 MG PO CAPS: 10.0000 mg | ORAL_CAPSULE | Freq: Every day | ORAL | 1 refills | Status: DC

## 2020-10-24 MED ORDER — SIMVASTATIN 20 MG PO TABS: 20.0000 mg | ORAL_TABLET | Freq: Every day | ORAL | 1 refills | Status: DC

## 2020-10-24 MED ORDER — MELOXICAM 15 MG PO TABS: 15.0000 mg | ORAL_TABLET | Freq: Every day | ORAL | 5 refills | Status: DC

## 2020-10-24 MED ORDER — METOPROLOL TARTRATE 100 MG PO TABS: 100.0000 mg | ORAL_TABLET | Freq: Two times a day (BID) | ORAL | 1 refills | Status: DC

## 2020-10-24 MED ORDER — PAROXETINE HCL 10 MG PO TABS: 10.0000 mg | ORAL_TABLET | Freq: Every day | ORAL | 1 refills | Status: DC

## 2020-10-24 NOTE — Progress Notes (Signed)
Subjective:   Bianca Hawkins is a 74 y.o. female who presents for Medicare Annual (Subsequent) preventive examination.  Virtual Visit via Telephone Note  I connected with  Bianca Hawkins on 10/24/20 at 10:40 AM EST by telephone and verified that I am speaking with the correct person using two identifiers.  Location: Patient: home Provider: [facility] Persons participating in the virtual visit: [place]   I discussed the limitations, risks, security and privacy concerns of performing an evaluation and management service by telephone and the availability of in person appointments. The patient expressed understanding and agreed to proceed.  Interactive audio and video telecommunications were attempted between this nurse and patient, however failed, due to patient having technical difficulties OR patient did not have access to video capability.  We continued and completed visit with audio only.  Some vital signs may be absent or patient reported.   Clemetine Marker, LPN    Review of Systems     Cardiac Risk Factors include: advanced age (>41men, >63 women);diabetes mellitus;dyslipidemia;hypertension     Objective:    Today's Vitals   10/24/20 1056  PainSc: 2    There is no height or weight on file to calculate BMI.  Advanced Directives 10/24/2020 10/21/2019 10/13/2018 10/10/2017 09/05/2015 08/09/2015  Does Patient Have a Medical Advance Directive? Yes Yes Yes No;Yes Yes Yes  Type of Paramedic of [place];Living will [place];Living will Living will;Healthcare Power of [place];Living will Living will;Healthcare Power of Attorney -  Copy of [place] in Chart? No - copy requested No - copy requested No - copy requested No - copy requested No - copy requested No - copy requested    Current Medications (verified) Outpatient Encounter Medications as of 10/24/2020   Medication Sig   ACCU-CHEK AVIVA PLUS test strip USE AS DIRECTED   aspirin 81 MG tablet Take 1 tablet by mouth daily.   busPIRone (BUSPAR) 5 MG tablet Take 1 tablet (5 mg total) by mouth 2 (two) times daily. (Patient taking differently: Take 5 mg by mouth as needed.)   cetirizine (ZYRTEC) 5 MG tablet Take 1 tablet by mouth as needed.   Cholecalciferol 25 MCG (1000 UT) tablet Take 1,000 mg by mouth daily.   diclofenac Sodium (VOLTAREN) 1 % GEL Apply 2 g topically 4 (four) times daily.   fluticasone (FLONASE) 50 MCG/ACT nasal spray Place 1 spray into both nostrils as needed.   glipiZIDE (GLUCOTROL XL) 5 MG 24 hr tablet Take 5 mg by mouth daily with breakfast. Dr Honor Hawkins   linagliptin (TRADJENTA) 5 MG TABS tablet Take 5 mg by mouth daily. Dr Honor Hawkins   meloxicam (MOBIC) 15 MG tablet Take 1 tablet (15 mg total) by mouth daily.   metFORMIN (GLUCOPHAGE) 1000 MG tablet Take 1 tablet (1,000 mg total) by mouth 2 (two) times daily. (Patient taking differently: Take 1,000 mg by mouth 2 (two) times daily. Dr Honor Hawkins)   metoprolol tartrate (LOPRESSOR) 100 MG tablet TAKE ONE TABLET TWICE DAILY   omeprazole (PRILOSEC) 20 MG capsule TAKE 1 CAPSULE EVERY DAY   PARoxetine (PAXIL) 10 MG tablet TAKE ONE TABLET EVERY DAY   ramipril (ALTACE) 10 MG capsule TAKE 1 CAPSULE EVERY DAY   simvastatin (ZOCOR) 20 MG tablet TAKE 1 TABLET BY MOUTH DAILY   No facility-administered encounter medications on file as of 10/24/2020.    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   Diabetes mellitus without complication ([place])    GERD (gastroesophageal reflux disease)    Hyperlipidemia    Hypertension    Osteoporosis    Past Surgical History:  Procedure Laterality Date   BIOPSY BREAST     benign   BREAST BIOPSY  Right 2014   core with clips;benign per pt   COLONOSCOPY  2008   cleared for 10 years/ occults- 07/22/2015- negative   INNER EAR SURGERY     benign tumor behind eardrum   PARATHYROID EXPLORATION     nodule removed   THYROID EXPLORATION  04/2017   nodule removed   Family History  Problem Relation Age of Onset   Ulcerative colitis Mother    Heart attack Father    Heart disease Sister    Birth defects Sister    Healthy Brother    Stroke Daughter    Healthy Son    Breast cancer Neg Hx    Social History   Socioeconomic History   Marital status: Widowed    Spouse name: Not on file   Number of children: 2   Years of education: Not on file   Highest education level: Associate degree: academic program  Occupational History   Occupation: Retired  Tobacco Use   Smoking status: Former Smoker    Packs/day: 0.50    Years: 30.00    Pack years: 15.00    Types: Cigarettes    Quit date: 2003    Years since quitting: 18.9   Smokeless tobacco: Never Used  Scientific laboratory technician Use: Never used  Substance and Sexual Activity   Alcohol use: No    Alcohol/week: 0.0 standard drinks   Drug use: No   Sexual activity: Not Currently  Other Topics Concern   Not on file  Social History Narrative   Pt lives alone   Social Determinants of Health   Financial Resource Strain: Low Risk    Difficulty of Paying Living Expenses: Not hard at all  Food Insecurity: No Food Insecurity   Worried About Charity fundraiser in the Last Year: Never true   Arboriculturist in the Last Year: Never true  Transportation Needs: No Transportation Needs   Lack of Transportation (Medical): No   Lack of Transportation (Non-Medical): No  Physical Activity: Inactive   Days of Exercise per Week: 0 days   Minutes of Exercise per Session: 0 min  Stress: No Stress Concern Present   Feeling of Stress : Not at all  Social Connections: Moderately Integrated   Frequency of  Communication with Friends and Family: More than three times a week   Frequency of Social Gatherings with Friends and Family: Once a week   Attends Religious Services: More than 4 times per year   Active Member of [employer] or Organizations: Yes   Attends Archivist Meetings: More than 4 times per year   Marital Status: Widowed    Tobacco Counseling Counseling given: Not Answered   Clinical Intake:  Pre-visit preparation completed: Yes  Pain : 0-10 Pain Score: 2  Pain Type: Acute pain Pain Location: Ankle (right knee) Pain Orientation: Left Pain Descriptors / Indicators: Aching,Sore Pain Onset: 1 to 4 weeks ago Pain Frequency: Constant     Nutritional Risks: None Diabetes: Yes CBG done?: No Did pt. bring in CBG monitor from home?:  No  How often do you need to have someone help you when you read instructions, pamphlets, or other written materials from your doctor or pharmacy?: 1 - Never  Nutrition Risk Assessment:  Has the patient had any N/V/D within the last 2 months?  No  Does the patient have any non-healing wounds?  No  Has the patient had any unintentional weight loss or weight gain?  No   Diabetes:  Is the patient diabetic?  Yes  If diabetic, was a CBG obtained today?  No  Did the patient bring in their glucometer from home?  No  How often do you monitor your CBG's? daily.   Financial Strains and Diabetes Management:  Are you having any financial strains with the device, your supplies or your medication? No .  Does the patient want to be seen by Chronic Care Management for management of their diabetes?  No  Would the patient like to be referred to a Nutritionist or for Diabetic Management?  No   Diabetic Exams:  Diabetic Eye Exam: Completed 12/22/19 negative retinopathy Dr. Ellin Mayhew  Diabetic Foot Exam: Completed 11/18/19.   Interpreter Needed?: No  Information entered by :: Clemetine Marker LPN   Activities of Daily Living In your present state  of health, do you have any difficulty performing the following activities: 10/24/2020  Hearing? Y  Comment declines hearing aids  Vision? N  Difficulty concentrating or making decisions? N  Walking or climbing stairs? N  Dressing or bathing? N  Doing errands, shopping? N  Preparing Food and eating ? N  Using the Toilet? N  In the past six months, have you accidently leaked urine? Y  Comment wears pads for protection at times  Do you have problems with loss of bowel control? N  Managing your Medications? N  Managing your Finances? N  Housekeeping or managing your Housekeeping? N  Some recent data might be hidden    Patient Care Team: Juline Patch, MD as PCP - General (Family Medicine) Lonia Farber, MD as Consulting Physician (Internal Medicine)  Indicate any recent Medical Services you may have received from other than [facility] providers in the past year (date may be approximate).     Assessment:   This is a routine wellness examination for [place].  Hearing/Vision screen  Hearing Screening   125Hz  250Hz  500Hz  1000Hz  2000Hz  3000Hz  4000Hz  6000Hz  8000Hz   Right ear:           Left ear:           Comments: Pt c/o mild hearing difficulty, does not need hearing aids; sees ENT  Vision Screening Comments: Annual vision screenings with Dr. Orion Modest  Dietary issues and exercise activities discussed: Current Exercise Habits: The patient does not participate in regular exercise at present, Exercise limited by: None identified  Goals     DIET - INCREASE WATER INTAKE     Recommend drinking 6-8 glasses of water per day      Exercise 150 min/wk Moderate Activity     Recommend to exercise at least 150 minutes per week      Depression Screen PHQ 2/9 Scores 10/24/2020 07/22/2020 05/23/2020 01/14/2020 10/21/2019 07/21/2019 01/13/2019  PHQ - 2 Score 0 0 0 0 0 0 0  PHQ- 9 Score - 0 0 3 - 0 0    Fall Risk Fall Risk  10/24/2020 07/22/2020 10/21/2019 01/13/2019 10/13/2018  Falls in the  past year? 0 0 0 0 0  Number falls in past yr:  0 - 0 0 0  Injury with Fall? 0 - 0 0 -  Risk for fall due to : No Fall Risks - - - -  Follow up Falls prevention discussed Falls evaluation completed Falls prevention discussed - -    FALL RISK PREVENTION PERTAINING TO THE HOME:  Any stairs in or around the home? No  If so, are there any without handrails? No  Home free of loose throw rugs in walkways, pet beds, electrical cords, etc? Yes  Adequate lighting in your home to reduce risk of falls? Yes   ASSISTIVE DEVICES UTILIZED TO PREVENT FALLS:  Life alert? No  Use of a cane, walker or w/c? No  Grab bars in the bathroom? Yes  Shower chair or bench in shower? No  Elevated toilet seat or a handicapped toilet? No   TIMED UP AND GO:  Was the test performed? No . Telephonic visit.   Cognitive Function: Normal cognitive status assessed by direct observation by this Nurse Health Advisor. No abnormalities found.       6CIT Screen 10/21/2019 10/10/2017  What Year? 0 points 0 points  What month? 0 points 0 points  What time? 0 points 0 points  Count back from 20 0 points 0 points  Months in reverse 0 points 0 points  Repeat phrase 0 points 0 points  Total Score 0 0    Immunizations Immunization History  Administered Date(s) Administered   PFIZER SARS-COV-2 Vaccination 12/30/2019, 01/20/2020, 08/30/2020   Pneumococcal Polysaccharide-23 09/03/2013   Tdap 09/03/2013   Zoster 11/13/2011    TDAP status: Up to date  Flu vaccine: n/a due to egg allergy  Pneumonia vaccine: PPSV23 completed with poor reaction; declines PCV13  Covid-19 vaccine status: Completed vaccines  Qualifies for Shingles Vaccine? Yes   Zostavax completed Yes   Shingrix Completed?: No.    Education has been provided regarding the importance of this vaccine. Patient has been advised to call insurance company to determine out of pocket expense if they have not yet received this vaccine. Advised may also  receive vaccine at local pharmacy or Health Dept. Verbalized acceptance and understanding.  Screening Tests Health Maintenance  Topic Date Due   FOOT EXAM  11/17/2020   HEMOGLOBIN A1C  11/26/2020   OPHTHALMOLOGY EXAM  12/21/2020   MAMMOGRAM  03/01/2021   COLON CANCER SCREENING ANNUAL FOBT  06/03/2021   TETANUS/TDAP  09/04/2023   DEXA SCAN  Completed   COVID-19 Vaccine  Completed   Hepatitis C Screening  Completed   PNA vac Low Risk Adult  Completed   INFLUENZA VACCINE  Discontinued   Fecal DNA (Cologuard)  Discontinued    Health Maintenance  There are no preventive care reminders to display for this patient.  Colorectal cancer screening: Type of screening: FOBT/FIT. Completed 06/03/20. Repeat every 1 years  Mammogram status: Completed 03/01/20. Repeat every year  Bone Density status: Completed 05/26/20. Results reflect: Bone density results: OSTEOPOROSIS. Repeat every 2 years.  Lung Cancer Screening: (Low Dose CT Chest recommended if Age 80-80 years, 30 pack-year currently smoking OR have quit w/in 15years.) does not qualify.   Additional Screening:  Hepatitis C Screening: does qualify; Completed 09/09/17  Vision Screening: Recommended annual ophthalmology exams for early detection of glaucoma and other disorders of the eye. Is the patient up to date with their annual eye exam?  Yes  Who is the provider or what is the name of the office in which the patient attends annual eye exams? Dr. Ellin Mayhew  Dental Screening: Recommended annual dental exams for proper oral hygiene  Community Resource Referral / Chronic Care Management: CRR required this visit?  No   CCM required this visit?  No      Plan:     I have personally reviewed and noted the following in the patients chart:    Medical and social history  Use of alcohol, tobacco or illicit drugs   Current medications and supplements  Functional ability and status  Nutritional status  Physical  activity  Advanced directives  List of other physicians  Hospitalizations, surgeries, and ER visits in previous 12 months  Vitals  Screenings to include cognitive, depression, and falls  Referrals and appointments  In addition, I have reviewed and discussed with patient certain preventive protocols, quality metrics, and best practice recommendations. A written personalized care plan for preventive services as well as general preventive health recommendations were provided to patient.     Clemetine Marker, LPN   [id number]   Nurse Notes: pt seeing Dr. Ronnald Ramp later today for med refill; pt also twisted ankle and knee while slipping on stairs 2 weeks ago but did not fall.

## 2020-10-24 NOTE — Progress Notes (Signed)
Date:  10/24/2020   Name:  Bianca Hawkins   DOB:  24-Sep-1946   MRN:  [id number]   Chief Complaint: Hyperlipidemia, Hypertension, Arthritis, Gastroesophageal Reflux, Allergic Rhinitis , Anxiety, and Fall (X 2 weeks ago- fell on bottom and is still bruised)  Hyperlipidemia This is a chronic problem. The current episode started more than 1 year ago. The problem is controlled. Recent lipid tests were reviewed and are normal. She has no history of chronic renal disease, hypothyroidism, liver disease, obesity or nephrotic syndrome. There are no known factors aggravating her hyperlipidemia. Pertinent negatives include no chest pain, focal sensory loss, focal weakness, leg pain, myalgias or shortness of breath. Current antihyperlipidemic treatment includes statins. The current treatment provides moderate improvement of lipids. There are no compliance problems.  Risk factors for coronary artery disease include dyslipidemia and hypertension.  Hypertension This is a chronic problem. The current episode started more than 1 year ago. The problem has been gradually improving since onset. The problem is controlled. Associated symptoms include anxiety. Pertinent negatives include no chest pain, headaches, palpitations or shortness of breath. There are no associated agents to hypertension. Risk factors for coronary artery disease include dyslipidemia. Past treatments include ACE inhibitors and beta blockers. The current treatment provides mild improvement. There are no compliance problems.  There is no history of angina, kidney disease, CAD/MI, CVA, heart failure, left ventricular hypertrophy, PVD or retinopathy. There is no history of chronic renal disease, a hypertension causing med or renovascular disease.  Arthritis Presents for follow-up visit. She reports no pain, stiffness, joint swelling or joint warmth. The symptoms have been stable. Affected locations include the left foot and left ankle. Pertinent  negatives include no diarrhea, dry mouth, dysuria, fatigue, fever, rash or weight loss.  Gastroesophageal Reflux She reports no abdominal pain, no belching, no chest pain, no choking, no coughing, no dysphagia, no early satiety, no globus sensation, no heartburn, no hoarse voice, no nausea, no sore throat, no stridor, no tooth decay, no water brash or no wheezing. This is a chronic problem. The current episode started more than 1 year ago. The problem has been gradually improving. The symptoms are aggravated by certain foods. Pertinent negatives include no anemia, fatigue, melena, muscle weakness, orthopnea or weight loss. There are no known risk factors. She has tried a PPI for the symptoms. The treatment provided moderate relief.  Anxiety Presents for follow-up visit. Symptoms include excessive worry and nervous/anxious behavior. Patient reports no chest pain, compulsions, confusion, decreased concentration, depressed mood, dizziness, dry mouth, feeling of choking, hyperventilation, impotence, insomnia, irritability, malaise, muscle tension, nausea, obsessions, palpitations, panic, restlessness, shortness of breath or suicidal ideas. Symptoms occur occasionally.    Fall The accident occurred more than 1 week ago (2 weeks). Fall occurred: coming down steps. Pertinent negatives include no abdominal pain, fever, headaches, hematuria, nausea, numbness or vomiting.    Lab Results  Component Value Date   CREATININE 0.7 12/11/2017   BUN 18 12/11/2017   NA 138 12/11/2017   K 6.0 (A) 12/11/2017   CL 101 10/15/2016   CO2 32 10/15/2016   Lab Results  Component Value Date   CHOL 157 09/25/2016   HDL 63 09/25/2016   LDLCALC 71 09/25/2016   TRIG 114 09/25/2016   CHOLHDL 2.5 09/25/2016   Lab Results  Component Value Date   TSH 0.55 12/11/2017   Lab Results  Component Value Date   HGBA1C 6.8 05/26/2020   Lab Results  Component Value Date  WBC 7.8 09/01/2019   HGB 12.2 09/01/2019   HCT 37.0  09/01/2019   MCV 87.3 09/01/2019   PLT 135 (L) 09/01/2019   Lab Results  Component Value Date   ALT 24 10/15/2016   AST 29 10/15/2016   ALKPHOS 50 10/15/2016   BILITOT 0.4 10/15/2016     Review of Systems  Constitutional: Negative.  Negative for chills, fatigue, fever, irritability, unexpected weight change and weight loss.  HENT: Negative for congestion, ear discharge, ear pain, hoarse voice, rhinorrhea, sinus pressure, sneezing and sore throat.   Eyes: Negative for double vision, photophobia, pain, discharge, redness and itching.  Respiratory: Negative for cough, choking, shortness of breath, wheezing and stridor.   Cardiovascular: Negative for chest pain and palpitations.  Gastrointestinal: Negative for abdominal pain, blood in stool, constipation, diarrhea, dysphagia, heartburn, melena, nausea and vomiting.  Endocrine: Negative for cold intolerance, heat intolerance, polydipsia, polyphagia and polyuria.  Genitourinary: Negative for dysuria, flank pain, frequency, hematuria, impotence, menstrual problem, pelvic pain, urgency, vaginal bleeding and vaginal discharge.  Musculoskeletal: Positive for arthritis. Negative for arthralgias, back pain, joint swelling, myalgias, muscle weakness and stiffness.  Skin: Negative for rash.  Allergic/Immunologic: Negative for environmental allergies and food allergies.  Neurological: Negative for dizziness, focal weakness, weakness, light-headedness, numbness and headaches.  Hematological: Negative for adenopathy. Does not bruise/bleed easily.  Psychiatric/Behavioral: Negative for confusion, decreased concentration, dysphoric mood and suicidal ideas. The patient is nervous/anxious. The patient does not have insomnia.     Patient Active Problem List   Diagnosis Date Noted  . Pure hypercholesterolemia 09/24/2018  . History of biliary T-tube placement 01/22/2018  . Recurrent major depressive episodes ([place]) 01/22/2018  . Age-related osteoporosis  without current pathological fracture 09/09/2017  . Hyperlipidemia due to type 2 diabetes mellitus ([place]) 09/09/2017  . Panic attacks 04/16/2017  . Hyperparathyroidism, primary ([place]) 03/01/2017  . Multinodular goiter 03/01/2017  . Essential hypertension 06/28/2016  . Chronic anxiety 06/28/2016  . Type 2 diabetes mellitus without complication, without long-term current use of insulin ([place]) 08/09/2015  . Familial multiple lipoprotein-type hyperlipidemia 08/09/2015  . Pain in joint 08/09/2015  . Encounter for general adult medical examination without abnormal findings 08/09/2015  . Chronic obstructive pulmonary disease (COPD) ([place]) 08/09/2015  . H/O: depression 08/09/2015  . Gastroesophageal reflux disease without esophagitis 08/09/2015  . Blood glucose elevated 08/09/2015  . Encounter for screening for osteoporosis 08/09/2015  . Need for vaccination 08/09/2015  . Chronic MEE (middle ear effusion) 08/09/2015  . 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 Other Shortness Of Breath    Pepper  . Eggs Or Egg-Derived Products   . Sulfa Antibiotics     Past Surgical History:  Procedure Laterality Date  . BIOPSY BREAST     benign  . BREAST BIOPSY Right 2014   core with clips;benign per pt  . COLONOSCOPY  2008   cleared for 10 years/ occults- 07/22/2015- negative  . INNER EAR SURGERY     benign tumor behind eardrum  . PARATHYROID EXPLORATION     nodule removed  . THYROID EXPLORATION  04/2017   nodule removed    Social History   Tobacco Use  . Smoking status: Former Smoker    Packs/day: 0.50    Years: 30.00    Pack years: 15.00    Types: Cigarettes    Quit date: 2003    Years since quitting: 18.9  . Smokeless tobacco: Never Used  Vaping Use  . Vaping Use:  Never used  Substance Use Topics  . Alcohol use: No    Alcohol/week: 0.0 standard drinks  . Drug use: No     Medication list has been reviewed and updated.  Current Meds   Medication Sig  . ACCU-CHEK AVIVA PLUS test strip USE AS DIRECTED  . aspirin 81 MG tablet Take 1 tablet by mouth daily.  . cetirizine (ZYRTEC) 5 MG tablet Take 1 tablet by mouth as needed.  . Cholecalciferol 25 MCG (1000 UT) tablet Take 1,000 mg by mouth daily.  . diclofenac Sodium (VOLTAREN) 1 % GEL Apply 2 g topically 4 (four) times daily.  . fluticasone (FLONASE) 50 MCG/ACT nasal spray Place 1 spray into both nostrils as needed.  Marland Kitchen glipiZIDE (GLUCOTROL XL) 5 MG 24 hr tablet Take 5 mg by mouth daily with breakfast. Dr Honor Junes  . linagliptin (TRADJENTA) 5 MG TABS tablet Take 5 mg by mouth daily. Dr Honor Junes  . meloxicam (MOBIC) 15 MG tablet Take 1 tablet (15 mg total) by mouth daily.  . metFORMIN (GLUCOPHAGE) 1000 MG tablet Take 1 tablet (1,000 mg total) by mouth 2 (two) times daily. (Patient taking differently: Take 1,000 mg by mouth 2 (two) times daily. Dr Honor Junes)  . metoprolol tartrate (LOPRESSOR) 100 MG tablet TAKE ONE TABLET TWICE DAILY  . omeprazole (PRILOSEC) 20 MG capsule TAKE 1 CAPSULE EVERY DAY  . PARoxetine (PAXIL) 10 MG tablet TAKE ONE TABLET EVERY DAY  . ramipril (ALTACE) 10 MG capsule TAKE 1 CAPSULE EVERY DAY  . simvastatin (ZOCOR) 20 MG tablet TAKE 1 TABLET BY MOUTH DAILY    PHQ 2/9 Scores 10/24/2020 10/24/2020 07/22/2020 05/23/2020  PHQ - 2 Score 0 0 0 0  PHQ- 9 Score 0 - 0 0    GAD 7 : Generalized Anxiety Score 07/22/2020 05/23/2020 01/14/2020  Nervous, Anxious, on Edge 0 0 3  Control/stop worrying 0 0 0  Worry too much - different things 0 0 0  Trouble relaxing 0 0 0  Restless 0 0 0  Easily annoyed or irritable 0 0 0  Afraid - awful might happen 0 0 0  Total GAD 7 Score 0 0 3  Anxiety Difficulty - - Not difficult at all    BP Readings from Last 3 Encounters:  10/24/20 130/86  07/22/20 122/80  05/23/20 120/80    Physical Exam Vitals and nursing note reviewed.  Constitutional:      Appearance: She is well-developed and well-nourished.  HENT:     Head:  Normocephalic.     Right Ear: Tympanic membrane, ear canal and external ear normal.     Left Ear: Tympanic membrane, ear canal and external ear normal.     Nose: Nose normal.     Mouth/Throat:     Mouth: Oropharynx is clear and moist. Mucous membranes are moist.  Eyes:     General: Lids are everted, no foreign bodies appreciated. No scleral icterus.       Left eye: No foreign body or hordeolum.     Extraocular Movements: EOM normal.     Conjunctiva/sclera: Conjunctivae normal.     Right eye: Right conjunctiva is not injected.     Left eye: Left conjunctiva is not injected.     Pupils: Pupils are equal, round, and reactive to light.  Neck:     Thyroid: No thyromegaly.     Vascular: No JVD.     Trachea: No tracheal deviation.  Cardiovascular:     Rate and Rhythm: Normal rate and regular rhythm.  Pulses: Intact distal pulses.     Heart sounds: Normal heart sounds. No murmur heard. No friction rub. No gallop.   Pulmonary:     Effort: Pulmonary effort is normal. No respiratory distress.     Breath sounds: Normal breath sounds. No stridor. No wheezing, rhonchi or rales.  Chest:     Chest wall: No tenderness.  Abdominal:     General: Bowel sounds are normal.     Palpations: Abdomen is soft. There is no hepatosplenomegaly or mass.     Tenderness: There is no abdominal tenderness. There is no guarding or rebound.  Musculoskeletal:        General: No tenderness or edema. Normal range of motion.     Cervical back: Normal range of motion and neck supple.  Lymphadenopathy:     Cervical: No cervical adenopathy.  Skin:    General: Skin is warm.     Capillary Refill: Capillary refill takes less than 2 seconds.     Findings: Bruising present. No rash.  Neurological:     Mental Status: She is alert and oriented to person, place, and time.     Cranial Nerves: No cranial nerve deficit.     Deep Tendon Reflexes: Strength normal. Reflexes normal.  Psychiatric:        Mood and Affect: Mood  and affect normal. Mood is not anxious or depressed.     Wt Readings from Last 3 Encounters:  10/24/20 174 lb (78.9 kg)  07/22/20 177 lb (80.3 kg)  05/23/20 179 lb (81.2 kg)    BP 130/86   Pulse 80   Ht 5\' 8"  (1.727 m)   Wt 174 lb (78.9 kg)   BMI 26.46 kg/m   Assessment and Plan: 1. Essential hypertension Chronic.  Controlled.  Stable.  Blood pressure 130/86.  Continue metoprolol 100 mg 1 twice a day and ramipril 10 mg 1 capsule daily.  Review of previous renal function panel is acceptable and we will repeat in 6 months - metoprolol tartrate (LOPRESSOR) 100 MG tablet; Take 1 tablet (100 mg total) by mouth 2 (two) times daily.  Dispense: 180 tablet; Refill: 1 - ramipril (ALTACE) 10 MG capsule; Take 1 capsule (10 mg total) by mouth daily.  Dispense: 90 capsule; Refill: 1  2. Gastroesophageal reflux disease without esophagitis Chronic.  Controlled.  Stable.  Continue omeprazole 20 mg once a day. - omeprazole (PRILOSEC) 20 MG capsule; TAKE 1 CAPSULE EVERY DAY  Dispense: 90 capsule; Refill: 1  3. Chronic anxiety Chronic.  Controlled.  Stable.  Gad score 2.  Continue Paxil 10 mg once a day. - PARoxetine (PAXIL) 10 MG tablet; Take 1 tablet (10 mg total) by mouth daily.  Dispense: 90 tablet; Refill: 1  4. Pure hypercholesterolemia Chronic.  Controlled.  Stable.  Continue Zocor 20 mg once a day.  Recheck of patient's previous lipid panel is acceptable and will repeat in 6 months fasting - simvastatin (ZOCOR) 20 MG tablet; Take 1 tablet (20 mg total) by mouth daily.  Dispense: 90 tablet; Refill: 1  5. Achilles tendinitis of left lower extremity Chronic.  Controlled.  Stable.  Patient will sustained another fall and still has occasional discomfort of the left foot and ankle.  Swelling is minimal and is controlled with meloxicam 15 mg once a day there is no palpable tenderness.  There is ecchymosis noted from a fall 2 weeks ago which is in the process of resolving. - meloxicam (MOBIC) 15 MG  tablet; Take 1 tablet (15 mg  total) by mouth daily.  Dispense: 30 tablet; Refill: 5

## 2020-10-24 NOTE — Patient Instructions (Signed)
Ms. Bianca Hawkins , Thank you for taking time to come for your Medicare Wellness Visit. I appreciate your ongoing commitment to your health goals. Please review the following plan we discussed and let me know if I can assist you in the future.   Screening recommendations/referrals: Colonoscopy: FOBT done 06/03/20 Mammogram: done 03/01/20 Bone Density: done 05/26/20 Recommended yearly ophthalmology/optometry visit for glaucoma screening and checkup Recommended yearly dental visit for hygiene and checkup  Vaccinations: Influenza vaccine: n/a due to egg allergy Pneumococcal vaccine: PPSV23 done 09/03/13; declined PCV13 Tdap vaccine: done 09/03/13 Shingles vaccine: Shingrix discussed. Please contact your pharmacy for coverage information.  Covid-19: done 12/30/19, 01/20/20 & 08/30/20  Advanced directives: Please bring a copy of your health care power of attorney and living will to the office at your convenience.  Conditions/risks identified: Recommend drinking 6-8 glasses of water per day   Next appointment: Follow up in one year for your annual wellness visit    Preventive Care 65 Years and Older, Female Preventive care refers to lifestyle choices and visits with your health care provider that can promote health and wellness. What does preventive care include?  A yearly physical exam. This is also called an annual well check.  Dental exams once or twice a year.  Routine eye exams. Ask your health care provider how often you should have your eyes checked.  Personal lifestyle choices, including:  Daily care of your teeth and gums.  Regular physical activity.  Eating a healthy diet.  Avoiding tobacco and drug use.  Limiting alcohol use.  Practicing safe sex.  Taking low-dose aspirin every day.  Taking vitamin and mineral supplements as recommended by your health care provider. What happens during an annual well check? The services and screenings done by your health care provider  during your annual well check will depend on your age, overall health, lifestyle risk factors, and family history of disease. Counseling  Your health care provider may ask you questions about your:  Alcohol use.  Tobacco use.  Drug use.  Emotional well-being.  Home and relationship well-being.  Sexual activity.  Eating habits.  History of falls.  Memory and ability to understand (cognition).  Work and work Statistician.  Reproductive health. Screening  You may have the following tests or measurements:  Height, weight, and BMI.  Blood pressure.  Lipid and cholesterol levels. These may be checked every 5 years, or more frequently if you are over 75 years old.  Skin check.  Lung cancer screening. You may have this screening every year starting at age 75 if you have a 30-pack-year history of smoking and currently smoke or have quit within the past 15 years.  Fecal occult blood test (FOBT) of the stool. You may have this test every year starting at age 75.  Flexible sigmoidoscopy or colonoscopy. You may have a sigmoidoscopy every 5 years or a colonoscopy every 10 years starting at age 75.  Hepatitis C blood test.  Hepatitis B blood test.  Sexually transmitted disease (STD) testing.  Diabetes screening. This is done by checking your blood sugar (glucose) after you have not eaten for a while (fasting). You may have this done every 1-3 years.  Bone density scan. This is done to screen for osteoporosis. You may have this done starting at age 75.  Mammogram. This may be done every 1-2 years. Talk to your health care provider about how often you should have regular mammograms. Talk with your health care provider about your test results, treatment options,  and if necessary, the need for more tests. Vaccines  Your health care provider may recommend certain vaccines, such as:  Influenza vaccine. This is recommended every year.  Tetanus, diphtheria, and acellular pertussis  (Tdap, Td) vaccine. You may need a Td booster every 10 years.  Zoster vaccine. You may need this after age 75.  Pneumococcal 13-valent conjugate (PCV13) vaccine. One dose is recommended after age 75.  Pneumococcal polysaccharide (PPSV23) vaccine. One dose is recommended after age 75. Talk to your health care provider about which screenings and vaccines you need and how often you need them. This information is not intended to replace advice given to you by your health care provider. Make sure you discuss any questions you have with your health care provider. Document Released: 11/25/2015 Document Revised: 07/18/2016 Document Reviewed: 08/30/2015 Elsevier Interactive Patient Education  2017 [place] Prevention in the Home Falls can cause injuries. They can happen to people of all ages. There are many things you can do to make your home safe and to help prevent falls. What can I do on the outside of my home?  Regularly fix the edges of walkways and driveways and fix any cracks.  Remove anything that might make you trip as you walk through a door, such as a raised step or threshold.  Trim any bushes or trees on the path to your home.  Use bright outdoor lighting.  Clear any walking paths of anything that might make someone trip, such as rocks or tools.  Regularly check to see if handrails are loose or broken. Make sure that both sides of any steps have handrails.  Any raised decks and porches should have guardrails on the edges.  Have any leaves, snow, or ice cleared regularly.  Use sand or salt on walking paths during winter.  Clean up any spills in your garage right away. This includes oil or grease spills. What can I do in the bathroom?  Use night lights.  Install grab bars by the toilet and in the tub and shower. Do not use towel bars as grab bars.  Use non-skid mats or decals in the tub or shower.  If you need to sit down in the shower, use a plastic, non-slip  stool.  Keep the floor dry. Clean up any water that spills on the floor as soon as it happens.  Remove soap buildup in the tub or shower regularly.  Attach bath mats securely with double-sided non-slip rug tape.  Do not have throw rugs and other things on the floor that can make you trip. What can I do in the bedroom?  Use night lights.  Make sure that you have a light by your bed that is easy to reach.  Do not use any sheets or blankets that are too big for your bed. They should not hang down onto the floor.  Have a firm chair that has side arms. You can use this for support while you get dressed.  Do not have throw rugs and other things on the floor that can make you trip. What can I do in the kitchen?  Clean up any spills right away.  Avoid walking on wet floors.  Keep items that you use a lot in easy-to-reach places.  If you need to reach something above you, use a strong step stool that has a grab bar.  Keep electrical cords out of the way.  Do not use floor polish or wax that makes floors slippery. If  you must use wax, use non-skid floor wax.  Do not have throw rugs and other things on the floor that can make you trip. What can I do with my stairs?  Do not leave any items on the stairs.  Make sure that there are handrails on both sides of the stairs and use them. Fix handrails that are broken or loose. Make sure that handrails are as long as the stairways.  Check any carpeting to make sure that it is firmly attached to the stairs. Fix any carpet that is loose or worn.  Avoid having throw rugs at the top or bottom of the stairs. If you do have throw rugs, attach them to the floor with carpet tape.  Make sure that you have a light switch at the top of the stairs and the bottom of the stairs. If you do not have them, ask someone to add them for you. What else can I do to help prevent falls?  Wear shoes that:  Do not have high heels.  Have rubber bottoms.  Are  comfortable and fit you well.  Are closed at the toe. Do not wear sandals.  If you use a stepladder:  Make sure that it is fully opened. Do not climb a closed stepladder.  Make sure that both sides of the stepladder are locked into place.  Ask someone to hold it for you, if possible.  Clearly mark and make sure that you can see:  Any grab bars or handrails.  First and last steps.  Where the edge of each step is.  Use tools that help you move around (mobility aids) if they are needed. These include:  Canes.  Walkers.  Scooters.  Crutches.  Turn on the lights when you go into a dark area. Replace any light bulbs as soon as they burn out.  Set up your furniture so you have a clear path. Avoid moving your furniture around.  If any of your floors are uneven, fix them.  If there are any pets around you, be aware of where they are.  Review your medicines with your doctor. Some medicines can make you feel dizzy. This can increase your chance of falling. Ask your doctor what other things that you can do to help prevent falls. This information is not intended to replace advice given to you by your health care provider. Make sure you discuss any questions you have with your health care provider. Document Released: 08/25/2009 Document Revised: 04/05/2016 Document Reviewed: 12/03/2014 Elsevier Interactive Patient Education  2017 [employer].

## 2020-12-21 DIAGNOSIS — H35033 Hypertensive retinopathy, bilateral: Secondary | ICD-10-CM | POA: Diagnosis not present

## 2020-12-21 DIAGNOSIS — E119 Type 2 diabetes mellitus without complications: Secondary | ICD-10-CM | POA: Diagnosis not present

## 2020-12-30 DIAGNOSIS — E1169 Type 2 diabetes mellitus with other specified complication: Secondary | ICD-10-CM | POA: Diagnosis not present

## 2020-12-30 DIAGNOSIS — E785 Hyperlipidemia, unspecified: Secondary | ICD-10-CM | POA: Diagnosis not present

## 2020-12-30 DIAGNOSIS — E1159 Type 2 diabetes mellitus with other circulatory complications: Secondary | ICD-10-CM | POA: Diagnosis not present

## 2020-12-30 DIAGNOSIS — M810 Age-related osteoporosis without current pathological fracture: Secondary | ICD-10-CM | POA: Diagnosis not present

## 2020-12-30 DIAGNOSIS — I152 Hypertension secondary to endocrine disorders: Secondary | ICD-10-CM | POA: Diagnosis not present

## 2020-12-30 LAB — HEMOGLOBIN A1C: Hemoglobin A1C: 6.5

## 2021-02-01 DIAGNOSIS — M810 Age-related osteoporosis without current pathological fracture: Secondary | ICD-10-CM | POA: Diagnosis not present

## 2021-02-23 ENCOUNTER — Other Ambulatory Visit: Payer: Self-pay | Admitting: Family Medicine

## 2021-02-23 DIAGNOSIS — F419 Anxiety disorder, unspecified: Secondary | ICD-10-CM

## 2021-03-01 ENCOUNTER — Encounter: Payer: Self-pay | Admitting: Family Medicine

## 2021-03-02 ENCOUNTER — Ambulatory Visit (INDEPENDENT_AMBULATORY_CARE_PROVIDER_SITE_OTHER): Payer: Medicare PPO | Admitting: Family Medicine

## 2021-03-02 ENCOUNTER — Other Ambulatory Visit: Payer: Self-pay

## 2021-03-02 ENCOUNTER — Encounter: Payer: Self-pay | Admitting: Family Medicine

## 2021-03-02 VITALS — BP 130/84 | HR 60 | Ht 68.0 in | Wt 173.0 lb

## 2021-03-02 DIAGNOSIS — R0789 Other chest pain: Secondary | ICD-10-CM | POA: Diagnosis not present

## 2021-03-02 DIAGNOSIS — N6459 Other signs and symptoms in breast: Secondary | ICD-10-CM

## 2021-03-02 DIAGNOSIS — Z1231 Encounter for screening mammogram for malignant neoplasm of breast: Secondary | ICD-10-CM

## 2021-03-02 DIAGNOSIS — R296 Repeated falls: Secondary | ICD-10-CM

## 2021-03-02 MED ORDER — MELOXICAM 15 MG PO TABS: 15.0000 mg | ORAL_TABLET | Freq: Every day | ORAL | 5 refills | Status: DC

## 2021-03-02 NOTE — Progress Notes (Signed)
Date:  03/02/2021   Name:  Bianca Hawkins   DOB:  Sep 30, 1946   MRN:  [id number]   Chief Complaint: breast tenderness (S/p fall from curb. Hurting in R) arm and under breast.) and Fall (Frequent falls x 3 in the past 6 months)  Fall The accident occurred more than 1 week ago (02/13/21). Fall occurred: while walking. She landed on concrete. The point of impact was the right wrist (right chest). Pain location: right axillary/right breast. The pain is moderate. Pertinent negatives include no abdominal pain, fever, headaches, hematuria, nausea, numbness or vomiting. She has tried NSAID and ice for the symptoms. The treatment provided moderate relief.    Lab Results  Component Value Date   CREATININE 0.7 12/11/2017   BUN 18 12/11/2017   NA 138 12/11/2017   K 6.0 (A) 12/11/2017   CL 101 10/15/2016   CO2 32 10/15/2016   Lab Results  Component Value Date   CHOL 157 09/25/2016   HDL 63 09/25/2016   LDLCALC 71 09/25/2016   TRIG 114 09/25/2016   CHOLHDL 2.5 09/25/2016   Lab Results  Component Value Date   TSH 0.55 12/11/2017   Lab Results  Component Value Date   HGBA1C 6.5 12/30/2020   Lab Results  Component Value Date   WBC 7.8 09/01/2019   HGB 12.2 09/01/2019   HCT 37.0 09/01/2019   MCV 87.3 09/01/2019   PLT 135 (L) 09/01/2019   Lab Results  Component Value Date   ALT 24 10/15/2016   AST 29 10/15/2016   ALKPHOS 50 10/15/2016   BILITOT 0.4 10/15/2016     Review of Systems  Constitutional: Negative.  Negative for chills, fatigue, fever and unexpected weight change.  HENT: Negative for congestion, ear discharge, ear pain, rhinorrhea, sinus pressure, sneezing and sore throat.   Eyes: Negative for photophobia, pain, discharge, redness and itching.  Respiratory: Negative for cough, shortness of breath, wheezing and stridor.   Gastrointestinal: Negative for abdominal pain, blood in stool, constipation, diarrhea, nausea and vomiting.  Endocrine: Negative for cold  intolerance, heat intolerance, polydipsia, polyphagia and polyuria.  Genitourinary: Negative for dysuria, flank pain, frequency, hematuria, menstrual problem, pelvic pain, urgency, vaginal bleeding and vaginal discharge.  Musculoskeletal: Negative for arthralgias, back pain and myalgias.  Skin: Negative for rash.  Allergic/Immunologic: Negative for environmental allergies and food allergies.  Neurological: Negative for dizziness, weakness, light-headedness, numbness and headaches.  Hematological: Negative for adenopathy. Does not bruise/bleed easily.  Psychiatric/Behavioral: Negative for dysphoric mood. The patient is not nervous/anxious.     Patient Active Problem List   Diagnosis Date Noted  . Pure hypercholesterolemia 09/24/2018  . History of biliary T-tube placement 01/22/2018  . Recurrent major depressive episodes ([place]) 01/22/2018  . Age-related osteoporosis without current pathological fracture 09/09/2017  . Hyperlipidemia due to type 2 diabetes mellitus ([place]) 09/09/2017  . Panic attacks 04/16/2017  . Hyperparathyroidism, primary ([place]) 03/01/2017  . Multinodular goiter 03/01/2017  . Essential hypertension 06/28/2016  . Chronic anxiety 06/28/2016  . Type 2 diabetes mellitus without complication, without long-term current use of insulin ([place]) 08/09/2015  . Familial multiple lipoprotein-type hyperlipidemia 08/09/2015  . Pain in joint 08/09/2015  . Encounter for general adult medical examination without abnormal findings 08/09/2015  . Chronic obstructive pulmonary disease (COPD) ([place]) 08/09/2015  . H/O: depression 08/09/2015  . Gastroesophageal reflux disease without esophagitis 08/09/2015  . Blood glucose elevated 08/09/2015  . Encounter for screening for osteoporosis 08/09/2015  . Need for vaccination 08/09/2015  . Chronic  MEE (middle ear effusion) 08/09/2015  . 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 Other Shortness Of Breath    Pepper   . Eggs Or Egg-Derived Products   . Sulfa Antibiotics     Past Surgical History:  Procedure Laterality Date  . BIOPSY BREAST     benign  . BREAST BIOPSY Right 2014   core with clips;benign per pt  . COLONOSCOPY  2008   cleared for 10 years/ occults- 07/22/2015- negative  . INNER EAR SURGERY     benign tumor behind eardrum  . PARATHYROID EXPLORATION     nodule removed  . THYROID EXPLORATION  04/2017   nodule removed    Social History   Tobacco Use  . Smoking status: Former Smoker    Packs/day: 0.50    Years: 30.00    Pack years: 15.00    Types: Cigarettes    Quit date: 2003    Years since quitting: 19.3  . Smokeless tobacco: Never Used  Vaping Use  . Vaping Use: Never used  Substance Use Topics  . Alcohol use: No    Alcohol/week: 0.0 standard drinks  . Drug use: No     Medication list has been reviewed and updated.  Current Meds  Medication Sig  . ACCU-CHEK AVIVA PLUS test strip USE AS DIRECTED  . aspirin 81 MG tablet Take 1 tablet by mouth daily.  . cetirizine (ZYRTEC) 5 MG tablet Take 1 tablet by mouth as needed.  . Cholecalciferol 25 MCG (1000 UT) tablet Take 1,000 mg by mouth daily.  . diclofenac Sodium (VOLTAREN) 1 % GEL Apply 2 g topically 4 (four) times daily.  . fluticasone (FLONASE) 50 MCG/ACT nasal spray Place 1 spray into both nostrils as needed.  Marland Kitchen glipiZIDE (GLUCOTROL XL) 5 MG 24 hr tablet Take 5 mg by mouth daily with breakfast. Dr Honor Junes  . linagliptin (TRADJENTA) 5 MG TABS tablet Take 5 mg by mouth daily. Dr Honor Junes  . meloxicam (MOBIC) 15 MG tablet Take 1 tablet (15 mg total) by mouth daily.  . metFORMIN (GLUCOPHAGE) 1000 MG tablet Take 1 tablet (1,000 mg total) by mouth 2 (two) times daily. (Patient taking differently: Take 1,000 mg by mouth 2 (two) times daily. Dr Honor Junes)  . metoprolol tartrate (LOPRESSOR) 100 MG tablet Take 1 tablet (100 mg total) by mouth 2 (two) times daily.  Marland Kitchen omeprazole (PRILOSEC) 20 MG capsule TAKE 1 CAPSULE  EVERY DAY  . PARoxetine (PAXIL) 10 MG tablet TAKE 1 TABLET BY MOUTH DAILY  . ramipril (ALTACE) 10 MG capsule Take 1 capsule (10 mg total) by mouth daily.  . simvastatin (ZOCOR) 20 MG tablet Take 1 tablet (20 mg total) by mouth daily.    PHQ 2/9 Scores 10/24/2020 10/24/2020 07/22/2020 05/23/2020  PHQ - 2 Score 0 0 0 0  PHQ- 9 Score 0 - 0 0    GAD 7 : Generalized Anxiety Score 07/22/2020 05/23/2020 01/14/2020  Nervous, Anxious, on Edge 0 0 3  Control/stop worrying 0 0 0  Worry too much - different things 0 0 0  Trouble relaxing 0 0 0  Restless 0 0 0  Easily annoyed or irritable 0 0 0  Afraid - awful might happen 0 0 0  Total GAD 7 Score 0 0 3  Anxiety Difficulty - - Not difficult at all    BP Readings from Last 3 Encounters:  03/02/21 130/84  10/24/20 130/86  07/22/20 122/80    Physical Exam Vitals  and nursing note reviewed.  Constitutional:      Appearance: She is well-developed.  HENT:     Head: Normocephalic.     Right Ear: External ear normal.     Left Ear: External ear normal.  Eyes:     General: Lids are everted, no foreign bodies appreciated. No scleral icterus.       Left eye: No foreign body or hordeolum.     Conjunctiva/sclera: Conjunctivae normal.     Right eye: Right conjunctiva is not injected.     Left eye: Left conjunctiva is not injected.     Pupils: Pupils are equal, round, and reactive to light.  Neck:     Thyroid: No thyromegaly.     Vascular: No JVD.     Trachea: No tracheal deviation.  Cardiovascular:     Rate and Rhythm: Normal rate and regular rhythm.     Heart sounds: Normal heart sounds. No murmur heard. No friction rub. No gallop.   Pulmonary:     Effort: Pulmonary effort is normal. No respiratory distress.     Breath sounds: Normal breath sounds. No wheezing or rales.  Abdominal:     General: Bowel sounds are normal.     Palpations: Abdomen is soft. There is no mass.     Tenderness: There is no abdominal tenderness. There is no guarding or  rebound.  Musculoskeletal:        General: No tenderness. Normal range of motion.     Cervical back: Normal range of motion and neck supple.  Lymphadenopathy:     Cervical: No cervical adenopathy.  Skin:    General: Skin is warm.     Findings: No rash.  Neurological:     Mental Status: She is alert and oriented to person, place, and time.     Cranial Nerves: No cranial nerve deficit.     Motor: Motor function is intact. No weakness, atrophy or abnormal muscle tone.     Deep Tendon Reflexes: Reflexes normal.  Psychiatric:        Mood and Affect: Mood is not anxious or depressed.     Wt Readings from Last 3 Encounters:  03/02/21 173 lb (78.5 kg)  10/24/20 174 lb (78.9 kg)  07/22/20 177 lb (80.3 kg)    BP 130/84   Pulse 60   Ht 5\' 8"  (1.727 m)   Wt 173 lb (78.5 kg)   BMI 26.30 kg/m   Assessment and Plan:  1. Chest wall pain New onset.  Persistent.  Gradually improving.  Patient fell on April 4 but she proceeded with her trip to [place] upon return she had an opportunity of the beach and she went to the beach and then upon return immediately decided she needed to be evaluated for discomfort in the axillary and breast and chest wall area.  Exam is consistent with the tenderness over the anterior lateral aspect of the chest wall.  Lungs are clear on both areas.  This is likely a contused area possibly a rib fracture but at this point time we will just continue the current nonsteroidal anti-inflammatory. - meloxicam (MOBIC) 15 MG tablet; Take 1 tablet (15 mg total) by mouth daily.  Dispense: 30 tablet; Refill: 5  2. Abnormal breast exam Upon evaluation of the breast there is a fullness mass in the right upper quadrant of the right breast that could be consistent with a contusion but there is no overlying ecchymosis.  We will proceed with bilateral tomograms of the  breast and ultrasound of the breast including the axillary area. - MM DIAG BREAST TOMO BILATERAL; Future - US BREAST  LTD UNI RIGHT INC AXILLA; Future  3. Frequent falls Patient has had some frequent falls but on doing neurologic exam patient has good strength distally as well is of the quadriceps and may be a little decreased with the hamstring.  Patient has good hip flexors and has been able to stand up without use of armrests.  I do not think that there is a neurologic condition going on but we will keep an eye on this is that she has had 3 falls but this seems to be her leg giving out she says or may be she is not picking of the the foot and to clear a step or stepping wrong on gravel or a rock.,  These are all suggestive means of falls that she has experienced in the most recent past.)  4. Breast cancer screening by mammogram As noted above we detected a little area of fullness in 10 /11:00 of the right breast.  At this point in time we will going to proceed with further evaluation although there may be a contusion in the area given her recent fall and hitting the right chest area and outstretched of right arm.

## 2021-03-07 ENCOUNTER — Ambulatory Visit
Admission: RE | Admit: 2021-03-07 | Discharge: 2021-03-07 | Disposition: A | Discharge status: Home / Self Care | Payer: Medicare PPO | Source: Ambulatory Visit | Attending: Family Medicine | Admitting: Family Medicine

## 2021-03-07 ENCOUNTER — Other Ambulatory Visit: Payer: Self-pay

## 2021-03-07 DIAGNOSIS — N6459 Other signs and symptoms in breast: Secondary | ICD-10-CM | POA: Diagnosis not present

## 2021-03-07 DIAGNOSIS — N644 Mastodynia: Secondary | ICD-10-CM | POA: Diagnosis not present

## 2021-03-07 DIAGNOSIS — R922 Inconclusive mammogram: Secondary | ICD-10-CM | POA: Diagnosis not present

## 2021-03-15 ENCOUNTER — Other Ambulatory Visit: Payer: Self-pay | Admitting: Family Medicine

## 2021-03-15 DIAGNOSIS — I10 Essential (primary) hypertension: Secondary | ICD-10-CM

## 2021-05-02 ENCOUNTER — Other Ambulatory Visit: Payer: Self-pay | Admitting: Family Medicine

## 2021-05-02 DIAGNOSIS — I10 Essential (primary) hypertension: Secondary | ICD-10-CM

## 2021-05-12 ENCOUNTER — Other Ambulatory Visit: Payer: Self-pay | Admitting: Family Medicine

## 2021-05-12 DIAGNOSIS — K219 Gastro-esophageal reflux disease without esophagitis: Secondary | ICD-10-CM

## 2021-06-12 ENCOUNTER — Ambulatory Visit
Admission: RE | Admit: 2021-06-12 | Discharge: 2021-06-12 | Disposition: A | Discharge status: Home / Self Care | Payer: Medicare PPO | Attending: Family Medicine | Admitting: Family Medicine

## 2021-06-12 ENCOUNTER — Other Ambulatory Visit: Payer: Self-pay

## 2021-06-12 ENCOUNTER — Ambulatory Visit: Payer: Medicare PPO | Admitting: Family Medicine

## 2021-06-12 ENCOUNTER — Encounter: Payer: Self-pay | Admitting: Family Medicine

## 2021-06-12 ENCOUNTER — Ambulatory Visit
Admission: RE | Admit: 2021-06-12 | Discharge: 2021-06-12 | Disposition: A | Discharge status: Home / Self Care | Payer: Medicare PPO | Source: Ambulatory Visit | Attending: Family Medicine | Admitting: Family Medicine

## 2021-06-12 VITALS — BP 136/70 | HR 60 | Ht 68.0 in | Wt 167.0 lb

## 2021-06-12 DIAGNOSIS — S92512A Displaced fracture of proximal phalanx of left lesser toe(s), initial encounter for closed fracture: Secondary | ICD-10-CM | POA: Diagnosis not present

## 2021-06-12 DIAGNOSIS — S93602A Unspecified sprain of left foot, initial encounter: Secondary | ICD-10-CM

## 2021-06-12 MED ORDER — MELOXICAM 15 MG PO TABS: 15.0000 mg | ORAL_TABLET | Freq: Every day | ORAL | 5 refills | Status: DC

## 2021-06-12 NOTE — Progress Notes (Signed)
Date:  06/12/2021   Name:  Bianca Hawkins   DOB:  26-Sep-1946   MRN:  [id number]   Chief Complaint: Toe Pain (Hx of 2 stress fractures, tinnitus, and plantar facialis. Its always the same foot. This pain started Monday night of last week- LEFT FOOT/ TOES. Walked to the bathroom from her bed in the middle of the night and when she walked back, she hit her middle toes on her night stand. Tied both toes together just in case its broken. Kept ice on it, and kept it elevated. )  Leg Pain  The incident occurred 5 to 7 days ago. The incident occurred at home. The injury mechanism was a direct blow (ran into a night stand). The pain is present in the left toes. The quality of the pain is described as aching. The pain is at a severity of 3/10. The pain is mild. Pertinent negatives include no inability to bear weight, loss of motion, numbness or tingling. The symptoms are aggravated by weight bearing. She has tried ice for the symptoms. The treatment provided mild relief.   Lab Results  Component Value Date   CREATININE 0.7 12/11/2017   BUN 18 12/11/2017   NA 138 12/11/2017   K 6.0 (A) 12/11/2017   CL 101 10/15/2016   CO2 32 10/15/2016   Lab Results  Component Value Date   CHOL 157 09/25/2016   HDL 63 09/25/2016   LDLCALC 71 09/25/2016   TRIG 114 09/25/2016   CHOLHDL 2.5 09/25/2016   Lab Results  Component Value Date   TSH 0.55 12/11/2017   Lab Results  Component Value Date   HGBA1C 6.5 12/30/2020   Lab Results  Component Value Date   WBC 7.8 09/01/2019   HGB 12.2 09/01/2019   HCT 37.0 09/01/2019   MCV 87.3 09/01/2019   PLT 135 (L) 09/01/2019   Lab Results  Component Value Date   ALT 24 10/15/2016   AST 29 10/15/2016   ALKPHOS 50 10/15/2016   BILITOT 0.4 10/15/2016     Review of Systems  Constitutional:  Negative for chills and fever.  HENT:  Negative for drooling, ear discharge, ear pain and sore throat.   Respiratory:  Negative for cough, shortness of breath and  wheezing.   Cardiovascular:  Negative for chest pain, palpitations and leg swelling.  Gastrointestinal:  Negative for abdominal pain, blood in stool, constipation, diarrhea and nausea.  Endocrine: Negative for polydipsia.  Genitourinary:  Negative for dysuria, frequency, hematuria and urgency.  Musculoskeletal:  Negative for back pain, myalgias and neck pain.  Skin:  Negative for rash.  Allergic/Immunologic: Negative for environmental allergies.  Neurological:  Negative for dizziness, tingling, numbness and headaches.  Hematological:  Does not bruise/bleed easily.  Psychiatric/Behavioral:  Negative for suicidal ideas. The patient is not nervous/anxious.    Patient Active Problem List   Diagnosis Date Noted   Pure hypercholesterolemia 09/24/2018   History of biliary T-tube placement 01/22/2018   Recurrent major depressive episodes ([place]) 01/22/2018   Age-related osteoporosis without current pathological fracture 09/09/2017   Hyperlipidemia due to type 2 diabetes mellitus ([place]) 09/09/2017   Panic attacks 04/16/2017   Hyperparathyroidism, primary ([place]) 03/01/2017   Multinodular goiter 03/01/2017   Essential hypertension 06/28/2016   Chronic anxiety 06/28/2016   Type 2 diabetes mellitus without complication, without long-term current use of insulin ([place]) 08/09/2015   Familial multiple lipoprotein-type hyperlipidemia 08/09/2015   Pain in joint 08/09/2015   Encounter for general adult medical examination without  abnormal findings 08/09/2015   Chronic obstructive pulmonary disease (COPD) ([place]) 08/09/2015   H/O: depression 08/09/2015   Gastroesophageal reflux disease without esophagitis 08/09/2015   Blood glucose elevated 08/09/2015   Encounter for screening for osteoporosis 08/09/2015   Need for vaccination 08/09/2015   Chronic MEE (middle ear effusion) 08/09/2015   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    Allergies  Allergen Reactions   Other Shortness Of Breath    Pepper   Eggs  Or Egg-Derived Products    Sulfa Antibiotics     Past Surgical History:  Procedure Laterality Date   BIOPSY BREAST     benign   BREAST BIOPSY Right 2014   core with clips;benign per pt   COLONOSCOPY  2008   cleared for 10 years/ occults- 07/22/2015- negative   INNER EAR SURGERY     benign tumor behind eardrum   PARATHYROID EXPLORATION     nodule removed   THYROID EXPLORATION  04/2017   nodule removed    Social History   Tobacco Use   Smoking status: Former    Packs/day: 0.50    Years: 30.00    Pack years: 15.00    Types: Cigarettes    Quit date: 2003    Years since quitting: 19.5   Smokeless tobacco: Never  Vaping Use   Vaping Use: Never used  Substance Use Topics   Alcohol use: No    Alcohol/week: 0.0 standard drinks   Drug use: No     Medication list has been reviewed and updated.  Current Meds  Medication Sig   ACCU-CHEK AVIVA PLUS test strip USE AS DIRECTED   aspirin 81 MG tablet Take 1 tablet by mouth daily.   cetirizine (ZYRTEC) 5 MG tablet Take 1 tablet by mouth as needed.   Cholecalciferol 25 MCG (1000 UT) tablet Take 1,000 mg by mouth daily.   diclofenac Sodium (VOLTAREN) 1 % GEL Apply 2 g topically 4 (four) times daily.   fluticasone (FLONASE) 50 MCG/ACT nasal spray Place 1 spray into both nostrils as needed.   glipiZIDE (GLUCOTROL XL) 5 MG 24 hr tablet Take 5 mg by mouth daily with breakfast. Dr Honor Junes   linagliptin (TRADJENTA) 5 MG TABS tablet Take 5 mg by mouth daily. Dr Honor Junes   meloxicam (MOBIC) 15 MG tablet Take 1 tablet (15 mg total) by mouth daily.   metFORMIN (GLUCOPHAGE) 1000 MG tablet Take 1 tablet (1,000 mg total) by mouth 2 (two) times daily. (Patient taking differently: Take 1,000 mg by mouth 2 (two) times daily. Dr Honor Junes)   metoprolol tartrate (LOPRESSOR) 100 MG tablet TAKE 1 TABLET BY MOUTH 2 TIMES DAILY   omeprazole (PRILOSEC) 20 MG capsule TAKE 1 CAPSULE BY MOUTH DAILY   PARoxetine (PAXIL) 10 MG tablet TAKE 1 TABLET BY  MOUTH DAILY   ramipril (ALTACE) 10 MG capsule TAKE 1 CAPSULE BY MOUTH DAILY   simvastatin (ZOCOR) 20 MG tablet Take 1 tablet (20 mg total) by mouth daily.    PHQ 2/9 Scores 06/12/2021 10/24/2020 10/24/2020 07/22/2020  PHQ - 2 Score 0 0 0 0  PHQ- 9 Score - 0 - 0    GAD 7 : Generalized Anxiety Score 06/12/2021 07/22/2020 05/23/2020 01/14/2020  Nervous, Anxious, on Edge 0 0 0 3  Control/stop worrying 0 0 0 0  Worry too much - different things 0 0 0 0  Trouble relaxing 0 0 0 0  Restless 0 0 0 0  Easily annoyed or irritable 0 0 0 0  Afraid -  awful might happen 0 0 0 0  Total GAD 7 Score 0 0 0 3  Anxiety Difficulty Not difficult at all - - Not difficult at all    BP Readings from Last 3 Encounters:  06/12/21 136/70  03/02/21 130/84  10/24/20 130/86    Physical Exam  Wt Readings from Last 3 Encounters:  06/12/21 167 lb (75.8 kg)  03/02/21 173 lb (78.5 kg)  10/24/20 174 lb (78.9 kg)    BP 136/70 (BP Location: Right Arm, Patient Position: Sitting, Cuff Size: Normal)   Pulse 60   Ht '5\' 8"'$  (1.727 m)   Wt 167 lb (75.8 kg)   BMI 25.39 kg/m   Assessment and Plan:  1. Foot sprain, left, initial encounter Acute.  Relatively controlled with no pain presently.  Minimal tenderness but patient did sustain a rather significant injury about a week ago when she ran into a nightstand in the dark.  Currently she is buddy taping to the adjacent toes and there is tenderness in the proximal of the fourth toe.  We will recheck an x-ray to make sure there is no fracture to determine how long we will continue to buddy tape and use cast boot.  In the meantime patient has had her meloxicam refilled to take on a as needed basis if she is going to be active. - meloxicam (MOBIC) 15 MG tablet; Take 1 tablet (15 mg total) by mouth daily.  Dispense: 30 tablet; Refill: 5 - DG Foot Complete Left; Future

## 2021-07-11 DIAGNOSIS — Z872 Personal history of diseases of the skin and subcutaneous tissue: Secondary | ICD-10-CM | POA: Diagnosis not present

## 2021-07-11 DIAGNOSIS — L578 Other skin changes due to chronic exposure to nonionizing radiation: Secondary | ICD-10-CM | POA: Diagnosis not present

## 2021-07-11 DIAGNOSIS — L821 Other seborrheic keratosis: Secondary | ICD-10-CM | POA: Diagnosis not present

## 2021-07-11 DIAGNOSIS — L918 Other hypertrophic disorders of the skin: Secondary | ICD-10-CM | POA: Diagnosis not present

## 2021-08-04 ENCOUNTER — Other Ambulatory Visit: Payer: Self-pay

## 2021-08-04 DIAGNOSIS — M810 Age-related osteoporosis without current pathological fracture: Secondary | ICD-10-CM | POA: Diagnosis not present

## 2021-08-04 DIAGNOSIS — E1169 Type 2 diabetes mellitus with other specified complication: Secondary | ICD-10-CM | POA: Diagnosis not present

## 2021-08-04 DIAGNOSIS — E119 Type 2 diabetes mellitus without complications: Secondary | ICD-10-CM | POA: Diagnosis not present

## 2021-08-04 DIAGNOSIS — I152 Hypertension secondary to endocrine disorders: Secondary | ICD-10-CM | POA: Diagnosis not present

## 2021-08-04 DIAGNOSIS — E1159 Type 2 diabetes mellitus with other circulatory complications: Secondary | ICD-10-CM | POA: Diagnosis not present

## 2021-08-04 DIAGNOSIS — E785 Hyperlipidemia, unspecified: Secondary | ICD-10-CM | POA: Diagnosis not present

## 2021-08-04 DIAGNOSIS — Z1211 Encounter for screening for malignant neoplasm of colon: Secondary | ICD-10-CM

## 2021-08-04 LAB — BASIC METABOLIC PANEL
BUN: 15 (ref 4–21)
Creatinine: 0.7 (ref 0.5–1.1)
Glucose: 135
Potassium: 4.5 (ref 3.4–5.3)
Sodium: 140 (ref 137–147)

## 2021-08-04 LAB — HEPATIC FUNCTION PANEL
ALT: 17 (ref 7–35)
AST: 19 (ref 13–35)

## 2021-08-04 LAB — LIPID PANEL
Cholesterol: 148 (ref 0–200)
HDL: 60 (ref 35–70)
LDL Cholesterol: 66
Triglycerides: 106 (ref 40–160)

## 2021-08-04 LAB — HEMOGLOBIN A1C: Hemoglobin A1C: 6.8

## 2021-08-04 LAB — TSH: TSH: 0.8 (ref 0.41–5.90)

## 2021-08-04 LAB — COMPREHENSIVE METABOLIC PANEL: Calcium: 10.2 (ref 8.7–10.7)

## 2021-08-04 NOTE — Progress Notes (Signed)
FIT ordered.

## 2021-08-08 DIAGNOSIS — M810 Age-related osteoporosis without current pathological fracture: Secondary | ICD-10-CM | POA: Diagnosis not present

## 2021-08-09 ENCOUNTER — Other Ambulatory Visit: Payer: Self-pay | Admitting: Family Medicine

## 2021-08-09 DIAGNOSIS — I10 Essential (primary) hypertension: Secondary | ICD-10-CM

## 2021-08-09 DIAGNOSIS — K219 Gastro-esophageal reflux disease without esophagitis: Secondary | ICD-10-CM

## 2021-08-16 ENCOUNTER — Other Ambulatory Visit: Payer: Self-pay | Admitting: Family Medicine

## 2021-08-16 DIAGNOSIS — E7800 Pure hypercholesterolemia, unspecified: Secondary | ICD-10-CM

## 2021-08-16 NOTE — Telephone Encounter (Signed)
Requested medication (s) are due for refill today - yes  Requested medication (s) are on the active medication list -yes  Future visit scheduled -yes  Last refill: 10/14/20 #90 1 RF  Notes to clinic: Request RF: fails lab protocol- 09/25/16  Requested Prescriptions  Pending Prescriptions Disp Refills   simvastatin (ZOCOR) 20 MG tablet [Pharmacy Med Name: SIMVASTATIN 20 MG TAB] 90 tablet 1    Sig: TAKE 1 TABLET BY MOUTH DAILY     Cardiovascular:  Antilipid - Statins Failed - 08/16/2021  9:34 AM      Failed - Total Cholesterol in normal range and within 360 days    Cholesterol, Total  Date Value Ref Range Status  09/25/2016 157 100 - 199 mg/dL Final          Failed - LDL in normal range and within 360 days    LDL Calculated  Date Value Ref Range Status  09/25/2016 71 0 - 99 mg/dL Final          Failed - HDL in normal range and within 360 days    HDL  Date Value Ref Range Status  09/25/2016 63 >39 mg/dL Final          Failed - Triglycerides in normal range and within 360 days    Triglycerides  Date Value Ref Range Status  09/25/2016 114 0 - 149 mg/dL Final          Passed - Patient is not pregnant      Passed - Valid encounter within last 12 months    Recent Outpatient Visits           2 months ago Foot sprain, left, initial encounter   [facility]ine Patch, [facility]   5 months ago Chest wall pain   [facility] Juline Patch, MD   9 months ago Essential hypertension   [facility] Juline Patch, MD   1 year ago Achilles tendinitis of left lower extremity   [facility] Juline Patch, MD   1 year ago Iliotibial band syndrome of right side   [facility] Juline Patch, MD                 Requested Prescriptions  Pending Prescriptions Disp Refills   simvastatin (ZOCOR) 20 MG tablet [Pharmacy Med Name: SIMVASTATIN 20 MG TAB] 90 tablet 1    Sig: TAKE 1 TABLET BY MOUTH DAILY     Cardiovascular:   Antilipid - Statins Failed - 08/16/2021  9:34 AM      Failed - Total Cholesterol in normal range and within 360 days    Cholesterol, Total  Date Value Ref Range Status  09/25/2016 157 100 - 199 mg/dL Final          Failed - LDL in normal range and within 360 days    LDL Calculated  Date Value Ref Range Status  09/25/2016 71 0 - 99 mg/dL Final          Failed - HDL in normal range and within 360 days    HDL  Date Value Ref Range Status  09/25/2016 63 >39 mg/dL Final          Failed - Triglycerides in normal range and within 360 days    Triglycerides  Date Value Ref Range Status  09/25/2016 114 0 - 149 mg/dL Final          Passed - Patient is not pregnant  Passed - Valid encounter within last 12 months    Recent Outpatient Visits           2 months ago Foot sprain, left, initial encounter   [facility] Juline Patch, MD   5 months ago Chest wall pain   [facility] Juline Patch, MD   9 months ago Essential hypertension   [facility] Juline Patch, MD   1 year ago Achilles tendinitis of left lower extremity   [facility] Juline Patch, MD   1 year ago Iliotibial band syndrome of right side   [facility] Juline Patch, MD

## 2021-08-22 DIAGNOSIS — Z1211 Encounter for screening for malignant neoplasm of colon: Secondary | ICD-10-CM | POA: Diagnosis not present

## 2021-08-22 LAB — FECAL OCCULT BLOOD, IMMUNOCHEMICAL: IFOBT: NEGATIVE

## 2021-08-23 ENCOUNTER — Other Ambulatory Visit: Payer: Self-pay | Admitting: Family Medicine

## 2021-08-23 DIAGNOSIS — F419 Anxiety disorder, unspecified: Secondary | ICD-10-CM

## 2021-08-23 NOTE — Telephone Encounter (Signed)
Requested Prescriptions  Pending Prescriptions Disp Refills  . PARoxetine (PAXIL) 10 MG tablet [Pharmacy Med Name: PAROXETINE HCL 10 MG TAB] 90 tablet 1    Sig: TAKE 1 TABLET BY MOUTH DAILY     Psychiatry:  Antidepressants - SSRI Passed - 08/23/2021 11:50 AM      Passed - Completed PHQ-2 or PHQ-9 in the last 360 days      Passed - Valid encounter within last 6 months    Recent Outpatient Visits          2 months ago Foot sprain, left, initial encounter   [facility] Juline Patch, MD   5 months ago Chest wall pain   [facility] Juline Patch, MD   10 months ago Essential hypertension   [facility] Juline Patch, MD   1 year ago Achilles tendinitis of left lower extremity   [facility] Juline Patch, MD   1 year ago Iliotibial band syndrome of right side   [facility] Juline Patch, MD

## 2021-08-27 LAB — FECAL OCCULT BLOOD, IMMUNOCHEMICAL: Fecal Occult Bld: NEGATIVE

## 2021-09-25 ENCOUNTER — Other Ambulatory Visit: Payer: Self-pay | Admitting: Family Medicine

## 2021-09-25 DIAGNOSIS — I10 Essential (primary) hypertension: Secondary | ICD-10-CM

## 2021-10-25 ENCOUNTER — Ambulatory Visit (INDEPENDENT_AMBULATORY_CARE_PROVIDER_SITE_OTHER): Payer: Medicare PPO

## 2021-10-25 DIAGNOSIS — Z0000 Encounter for general adult medical examination without abnormal findings: Secondary | ICD-10-CM

## 2021-10-25 NOTE — Patient Instructions (Signed)
Bianca Hawkins , Thank you for taking time to come for your Medicare Wellness Visit. I appreciate your ongoing commitment to your health goals. Please review the following plan we discussed and let me know if I can assist you in the future.   Screening recommendations/referrals: Colonoscopy: no longer required Mammogram: done 03/07/21 Bone Density: done 05/26/20 Recommended yearly ophthalmology/optometry visit for glaucoma screening and checkup Recommended yearly dental visit for hygiene and checkup  Vaccinations: Influenza vaccine: n/a due to egg allergy  Pneumococcal vaccine: done 09/03/13 Tdap vaccine: done 09/03/13 Shingles vaccine: Shingrix discussed. Please contact your pharmacy for coverage information.  Covid-19: done 12/30/19, 01/20/20 & 08/30/20; please bring a copy of your vaccine record to your next appt for additional booster information   Advanced directives: Please bring a copy of your health care power of attorney and living will to the office at your convenience.   Conditions/risks identified: Recommend drinking 6-8 glasses of water per day   Next appointment: Follow up in one year for your annual wellness visit    Preventive Care 65 Years and Older, Female Preventive care refers to lifestyle choices and visits with your health care provider that can promote health and wellness. What does preventive care include? A yearly physical exam. This is also called an annual well check. Dental exams once or twice a year. Routine eye exams. Ask your health care provider how often you should have your eyes checked. Personal lifestyle choices, including: Daily care of your teeth and gums. Regular physical activity. Eating a healthy diet. Avoiding tobacco and drug use. Limiting alcohol use. Practicing safe sex. Taking low-dose aspirin every day. Taking vitamin and mineral supplements as recommended by your health care provider. What happens during an annual well check? The  services and screenings done by your health care provider during your annual well check will depend on your age, overall health, lifestyle risk factors, and family history of disease. Counseling  Your health care provider may ask you questions about your: Alcohol use. Tobacco use. Drug use. Emotional well-being. Home and relationship well-being. Sexual activity. Eating habits. History of falls. Memory and ability to understand (cognition). Work and work Statistician. Reproductive health. Screening  You may have the following tests or measurements: Height, weight, and BMI. Blood pressure. Lipid and cholesterol levels. These may be checked every 5 years, or more frequently if you are over 6 years old. Skin check. Lung cancer screening. You may have this screening every year starting at age 50 if you have a 30-pack-year history of smoking and currently smoke or have quit within the past 15 years. Fecal occult blood test (FOBT) of the stool. You may have this test every year starting at age 49. Flexible sigmoidoscopy or colonoscopy. You may have a sigmoidoscopy every 5 years or a colonoscopy every 10 years starting at age 80. Hepatitis C blood test. Hepatitis B blood test. Sexually transmitted disease (STD) testing. Diabetes screening. This is done by checking your blood sugar (glucose) after you have not eaten for a while (fasting). You may have this done every 1-3 years. Bone density scan. This is done to screen for osteoporosis. You may have this done starting at age 54. Mammogram. This may be done every 1-2 years. Talk to your health care provider about how often you should have regular mammograms. Talk with your health care provider about your test results, treatment options, and if necessary, the need for more tests. Vaccines  Your health care provider may recommend certain vaccines, such as:  Influenza vaccine. This is recommended every year. Tetanus, diphtheria, and acellular  pertussis (Tdap, Td) vaccine. You may need a Td booster every 10 years. Zoster vaccine. You may need this after age 44. Pneumococcal 13-valent conjugate (PCV13) vaccine. One dose is recommended after age 28. Pneumococcal polysaccharide (PPSV23) vaccine. One dose is recommended after age 15. Talk to your health care provider about which screenings and vaccines you need and how often you need them. This information is not intended to replace advice given to you by your health care provider. Make sure you discuss any questions you have with your health care provider. Document Released: 11/25/2015 Document Revised: 07/18/2016 Document Reviewed: 08/30/2015 Elsevier Interactive Patient Education  2017 [place] Prevention in the Home Falls can cause injuries. They can happen to people of all ages. There are many things you can do to make your home safe and to help prevent falls. What can I do on the outside of my home? Regularly fix the edges of walkways and driveways and fix any cracks. Remove anything that might make you trip as you walk through a door, such as a raised step or threshold. Trim any bushes or trees on the path to your home. Use bright outdoor lighting. Clear any walking paths of anything that might make someone trip, such as rocks or tools. Regularly check to see if handrails are loose or broken. Make sure that both sides of any steps have handrails. Any raised decks and porches should have guardrails on the edges. Have any leaves, snow, or ice cleared regularly. Use sand or salt on walking paths during winter. Clean up any spills in your garage right away. This includes oil or grease spills. What can I do in the bathroom? Use night lights. Install grab bars by the toilet and in the tub and shower. Do not use towel bars as grab bars. Use non-skid mats or decals in the tub or shower. If you need to sit down in the shower, use a plastic, non-slip stool. Keep the floor  dry. Clean up any water that spills on the floor as soon as it happens. Remove soap buildup in the tub or shower regularly. Attach bath mats securely with double-sided non-slip rug tape. Do not have throw rugs and other things on the floor that can make you trip. What can I do in the bedroom? Use night lights. Make sure that you have a light by your bed that is easy to reach. Do not use any sheets or blankets that are too big for your bed. They should not hang down onto the floor. Have a firm chair that has side arms. You can use this for support while you get dressed. Do not have throw rugs and other things on the floor that can make you trip. What can I do in the kitchen? Clean up any spills right away. Avoid walking on wet floors. Keep items that you use a lot in easy-to-reach places. If you need to reach something above you, use a strong step stool that has a grab bar. Keep electrical cords out of the way. Do not use floor polish or wax that makes floors slippery. If you must use wax, use non-skid floor wax. Do not have throw rugs and other things on the floor that can make you trip. What can I do with my stairs? Do not leave any items on the stairs. Make sure that there are handrails on both sides of the stairs and use them.  Fix handrails that are broken or loose. Make sure that handrails are as long as the stairways. Check any carpeting to make sure that it is firmly attached to the stairs. Fix any carpet that is loose or worn. Avoid having throw rugs at the top or bottom of the stairs. If you do have throw rugs, attach them to the floor with carpet tape. Make sure that you have a light switch at the top of the stairs and the bottom of the stairs. If you do not have them, ask someone to add them for you. What else can I do to help prevent falls? Wear shoes that: Do not have high heels. Have rubber bottoms. Are comfortable and fit you well. Are closed at the toe. Do not wear  sandals. If you use a stepladder: Make sure that it is fully opened. Do not climb a closed stepladder. Make sure that both sides of the stepladder are locked into place. Ask someone to hold it for you, if possible. Clearly mark and make sure that you can see: Any grab bars or handrails. First and last steps. Where the edge of each step is. Use tools that help you move around (mobility aids) if they are needed. These include: Canes. Walkers. Scooters. Crutches. Turn on the lights when you go into a dark area. Replace any light bulbs as soon as they burn out. Set up your furniture so you have a clear path. Avoid moving your furniture around. If any of your floors are uneven, fix them. If there are any pets around you, be aware of where they are. Review your medicines with your doctor. Some medicines can make you feel dizzy. This can increase your chance of falling. Ask your doctor what other things that you can do to help prevent falls. This information is not intended to replace advice given to you by your health care provider. Make sure you discuss any questions you have with your health care provider. Document Released: 08/25/2009 Document Revised: 04/05/2016 Document Reviewed: 12/03/2014 Elsevier Interactive Patient Education  2017 [employer].

## 2021-10-25 NOTE — Progress Notes (Signed)
Subjective:   Bianca Hawkins is a 75 y.o. female who presents for Medicare Annual (Subsequent) preventive examination.  Virtual Visit via Telephone Note  I connected with  Bianca Hawkins on 10/25/21 at 10:40 AM EST by telephone and verified that I am speaking with the correct person using two identifiers.  Location: Patient: home Provider: [facility] Persons participating in the virtual visit: [place]   I discussed the limitations, risks, security and privacy concerns of performing an evaluation and management service by telephone and the availability of in person appointments. The patient expressed understanding and agreed to proceed.  Interactive audio and video telecommunications were attempted between this nurse and patient, however failed, due to patient having technical difficulties OR patient did not have access to video capability.  We continued and completed visit with audio only.  Some vital signs may be absent or patient reported.   Clemetine Marker, LPN   Review of Systems     Cardiac Risk Factors include: advanced age (>52men, >3 women);diabetes mellitus;dyslipidemia;hypertension     Objective:    There were no vitals filed for this visit. There is no height or weight on file to calculate BMI.  Advanced Directives 10/25/2021 10/24/2020 10/21/2019 10/13/2018 10/10/2017 09/05/2015 08/09/2015  Does Patient Have a Medical Advance Directive? Yes Yes Yes Yes No;Yes Yes Yes  Type of Paramedic of [place];Living will [place];Living will [place];Living will Living will;Healthcare Power of [place];Living will Living will;Healthcare Power of Attorney -  Copy of [place] in Chart? No - copy requested No - copy requested No - copy requested No - copy requested No - copy requested No - copy requested No - copy requested    Current Medications  (verified) Outpatient Encounter Medications as of 10/25/2021  Medication Sig   ACCU-CHEK AVIVA PLUS test strip USE AS DIRECTED   aspirin 81 MG tablet Take 1 tablet by mouth daily.   cetirizine (ZYRTEC) 5 MG tablet Take 1 tablet by mouth as needed.   Cholecalciferol 25 MCG (1000 UT) tablet Take 1,000 mg by mouth daily.   diclofenac Sodium (VOLTAREN) 1 % GEL Apply 2 g topically 4 (four) times daily.   fluticasone (FLONASE) 50 MCG/ACT nasal spray Place 1 spray into both nostrils as needed.   glipiZIDE (GLUCOTROL XL) 5 MG 24 hr tablet Take 5 mg by mouth daily with breakfast. Dr Honor Hawkins   linagliptin (TRADJENTA) 5 MG TABS tablet Take 5 mg by mouth daily. Dr Honor Hawkins   metFORMIN (GLUCOPHAGE) 1000 MG tablet Take 1 tablet (1,000 mg total) by mouth 2 (two) times daily. (Patient taking differently: Take 1,000 mg by mouth 2 (two) times daily. Dr Honor Hawkins)   metoprolol tartrate (LOPRESSOR) 100 MG tablet TAKE 1 TABLET BY MOUTH 2 TIMES DAILY   omeprazole (PRILOSEC) 20 MG capsule TAKE 1 CAPSULE BY MOUTH DAILY   PARoxetine (PAXIL) 10 MG tablet TAKE 1 TABLET BY MOUTH DAILY   ramipril (ALTACE) 10 MG capsule TAKE 1 CAPSULE BY MOUTH DAILY   simvastatin (ZOCOR) 20 MG tablet TAKE 1 TABLET BY MOUTH DAILY   [DISCONTINUED] meloxicam (MOBIC) 15 MG tablet Take 1 tablet (15 mg total) by mouth daily.   No facility-administered encounter medications on file as of 10/25/2021.    Allergies (verified) Other, Eggs or egg-derived products, and Sulfa antibiotics   History: Past Medical History:  Diagnosis Date   Allergy    Anxiety    Depression  reccurent depression to due situational disturbance   Diabetes mellitus without complication (HCC)    GERD (gastroesophageal reflux disease)    Hyperlipidemia    Hypertension    Osteoporosis    Past Surgical History:  Procedure Laterality Date   BIOPSY BREAST     benign   BREAST BIOPSY Right 2014   core with clips;benign per pt   COLONOSCOPY  2008   cleared  for 10 years/ occults- 07/22/2015- negative   INNER EAR SURGERY     benign tumor behind eardrum   PARATHYROID EXPLORATION     nodule removed   THYROID EXPLORATION  04/2017   nodule removed   Family History  Problem Relation Age of Onset   Ulcerative colitis Mother    Heart attack Father    Heart disease Sister    Birth defects Sister    Healthy Brother    Stroke Daughter    Healthy Son    Breast cancer Neg Hx    Social History   Socioeconomic History   Marital status: Widowed    Spouse name: Not on file   Number of children: 2   Years of education: Not on file   Highest education level: Associate degree: academic program  Occupational History   Occupation: Retired  Tobacco Use   Smoking status: Former    Packs/day: 0.50    Years: 30.00    Pack years: 15.00    Types: Cigarettes    Quit date: 2003    Years since quitting: 19.9   Smokeless tobacco: Never  Vaping Use   Vaping Use: Never used  Substance and Sexual Activity   Alcohol use: No    Alcohol/week: 0.0 standard drinks   Drug use: No   Sexual activity: Not Currently  Other Topics Concern   Not on file  Social History Narrative   Pt lives alone   Social Determinants of Health   Financial Resource Strain: Low Risk    Difficulty of Paying Living Expenses: Not hard at all  Food Insecurity: No Food Insecurity   Worried About Charity fundraiser in the Last Year: Never true   Arboriculturist in the Last Year: Never true  Transportation Needs: No Transportation Needs   Lack of Transportation (Medical): No   Lack of Transportation (Non-Medical): No  Physical Activity: Inactive   Days of Exercise per Week: 0 days   Minutes of Exercise per Session: 0 min  Stress: No Stress Concern Present   Feeling of Stress : Not at all  Social Connections: Moderately Integrated   Frequency of Communication with Friends and Family: More than three times a week   Frequency of Social Gatherings with Friends and Family: Once  a week   Attends Religious Services: More than 4 times per year   Active Member of [employer] or Organizations: Yes   Attends Archivist Meetings: More than 4 times per year   Marital Status: Widowed    Tobacco Counseling Counseling given: Not Answered   Clinical Intake:  Pre-visit preparation completed: Yes  Pain : No/denies pain     Nutritional Risks: None Diabetes: Yes CBG done?: No Did pt. bring in CBG monitor from home?: No  How often do you need to have someone help you when you read instructions, pamphlets, or other written materials from your doctor or pharmacy?: 1 - Never  Nutrition Risk Assessment:  Has the patient had any N/V/D within the last 2 months?  No  Does the  patient have any non-healing wounds?  No  Has the patient had any unintentional weight loss or weight gain?  No   Diabetes:  Is the patient diabetic?  Yes  If diabetic, was a CBG obtained today?  No  Did the patient bring in their glucometer from home?  No  How often do you monitor your CBG's? daily.   Financial Strains and Diabetes Management:  Are you having any financial strains with the device, your supplies or your medication? No .  Does the patient want to be seen by Chronic Care Management for management of their diabetes?  No  Would the patient like to be referred to a Nutritionist or for Diabetic Management?  No   Diabetic Exams:  Diabetic Eye Exam: Completed 01/11/21.   Diabetic Foot Exam: Completed 12/30/20.   Interpreter Needed?: No  Information entered by :: Clemetine Marker LPN   Activities of Daily Living In your present state of health, do you have any difficulty performing the following activities: 10/25/2021  Hearing? N  Vision? N  Difficulty concentrating or making decisions? N  Walking or climbing stairs? N  Dressing or bathing? N  Doing errands, shopping? N  Preparing Food and eating ? N  Using the Toilet? N  In the past six months, have you accidently leaked  urine? Y  Do you have problems with loss of bowel control? N  Managing your Medications? N  Managing your Finances? N  Housekeeping or managing your Housekeeping? N  Some recent data might be hidden    Patient Care Team: Juline Patch, MD as PCP - General (Family Medicine) Lonia Farber, MD as Consulting Physician (Internal Medicine)  Indicate any recent Medical Services you may have received from other than [facility] providers in the past year (date may be approximate).     Assessment:   This is a routine wellness examination for [place].  Hearing/Vision screen Hearing Screening - Comments::  c/o mild hearing difficulty, does not need hearing aids; sees ENT Vision Screening - Comments:: Annual vision screenings with Dr. Orion Modest  Dietary issues and exercise activities discussed: Current Exercise Habits: The patient does not participate in regular exercise at present, Exercise limited by: orthopedic condition(s)   Goals Addressed             This Visit's Progress    DIET - INCREASE WATER INTAKE   Not on track    Recommend drinking 6-8 glasses of water per day        Depression Screen PHQ 2/9 Scores 10/25/2021 06/12/2021 10/24/2020 10/24/2020 07/22/2020 05/23/2020 01/14/2020  PHQ - 2 Score 0 0 0 0 0 0 0  PHQ- 9 Score - - 0 - 0 0 3    Fall Risk Fall Risk  10/25/2021 06/12/2021 03/02/2021 10/24/2020 07/22/2020  Falls in the past year? 1 0 1 0 0  Number falls in past yr: 1 0 1 0 -  Injury with Fall? 0 0 1 0 -  Comment - - arm hurting from catching herself - -  Risk for fall due to : History of fall(s) No Fall Risks - No Fall Risks -  Follow up Falls prevention discussed Falls evaluation completed - Falls prevention discussed Falls evaluation completed    FALL RISK PREVENTION PERTAINING TO THE HOME:  Any stairs in or around the home? No  If so, are there any without handrails? No  Home free of loose throw rugs in walkways, pet beds, electrical cords, etc? Yes  Adequate  lighting in your home to reduce risk of falls? Yes   ASSISTIVE DEVICES UTILIZED TO PREVENT FALLS:  Life alert? No  Use of a cane, walker or w/c? No  Grab bars in the bathroom? Yes  Shower chair or bench in shower? No  Elevated toilet seat or a handicapped toilet? No   TIMED UP AND GO:  Was the test performed? No . Telephonic visit.   Cognitive Function: Normal cognitive status assessed by direct observation by this Nurse Health Advisor. No abnormalities found.       6CIT Screen 10/21/2019 10/10/2017  What Year? 0 points 0 points  What month? 0 points 0 points  What time? 0 points 0 points  Count back from 20 0 points 0 points  Months in reverse 0 points 0 points  Repeat phrase 0 points 0 points  Total Score 0 0    Immunizations Immunization History  Administered Date(s) Administered   PFIZER(Purple Top)SARS-COV-2 Vaccination 12/30/2019, 01/20/2020, 08/30/2020   Pneumococcal Polysaccharide-23 09/03/2013   Tdap 09/03/2013   Zoster, Live 11/13/2011    TDAP status: Up to date  Flu vaccine: n/a due to egg allergy  Pneumococcal vaccine status: Up to date with PPSV23 but had reaction and declined PCV13  Covid-19 vaccine status: Completed vaccines  Qualifies for Shingles Vaccine? Yes   Zostavax completed Yes   Shingrix Completed?: No.    Education has been provided regarding the importance of this vaccine. Patient has been advised to call insurance company to determine out of pocket expense if they have not yet received this vaccine. Advised may also receive vaccine at local pharmacy or Health Dept. Verbalized acceptance and understanding.  Screening Tests Health Maintenance  Topic Date Due   Zoster Vaccines- Shingrix (1 of 2) Never done   COVID-19 Vaccine (4 - Booster for Pfizer series) 10/25/2020   Pneumonia Vaccine 15+ Years old (2 - PCV) 10/25/2022 (Originally 09/03/2014)   FOOT EXAM  12/30/2021   OPHTHALMOLOGY EXAM  01/11/2022   HEMOGLOBIN A1C  02/01/2022    MAMMOGRAM  03/07/2022   TETANUS/TDAP  09/04/2023   DEXA SCAN  Completed   Hepatitis C Screening  Completed   HPV VACCINES  Aged Out   INFLUENZA VACCINE  Discontinued   Fecal DNA (Cologuard)  Discontinued    Health Maintenance  Health Maintenance Due  Topic Date Due   Zoster Vaccines- Shingrix (1 of 2) Never done   COVID-19 Vaccine (4 - Booster for Pfizer series) 10/25/2020    Colorectal cancer screening: No longer required.   Mammogram status: Completed 03/07/21. Repeat every year  Bone Density status: Completed 05/26/20. Results reflect: Bone density results: OSTEOPOROSIS. Repeat every 2 years.  Lung Cancer Screening: (Low Dose CT Chest recommended if Age 48-80 years, 30 pack-year currently smoking OR have quit w/in 15years.) does not qualify.   Additional Screening:  Hepatitis C Screening: does qualify; Completed 09/09/17  Vision Screening: Recommended annual ophthalmology exams for early detection of glaucoma and other disorders of the eye. Is the patient up to date with their annual eye exam?  Yes  Who is the provider or what is the name of the office in which the patient attends annual eye exams? Dr. Ellin Mayhew.   Dental Screening: Recommended annual dental exams for proper oral hygiene  Community Resource Referral / Chronic Care Management: CRR required this visit?  No   CCM required this visit?  No      Plan:     I have personally reviewed and noted the following  in the patients chart:   Medical and social history Use of alcohol, tobacco or illicit drugs  Current medications and supplements including opioid prescriptions.  Functional ability and status Nutritional status Physical activity Advanced directives List of other physicians Hospitalizations, surgeries, and ER visits in previous 12 months Vitals Screenings to include cognitive, depression, and falls Referrals and appointments  In addition, I have reviewed and discussed with patient certain  preventive protocols, quality metrics, and best practice recommendations. A written personalized care plan for preventive services as well as general preventive health recommendations were provided to patient.     Clemetine Marker, LPN   [id number]   Nurse Notes: none

## 2021-11-09 ENCOUNTER — Other Ambulatory Visit: Payer: Self-pay | Admitting: Family Medicine

## 2021-11-09 DIAGNOSIS — I10 Essential (primary) hypertension: Secondary | ICD-10-CM

## 2021-11-09 DIAGNOSIS — E7800 Pure hypercholesterolemia, unspecified: Secondary | ICD-10-CM

## 2021-11-14 ENCOUNTER — Other Ambulatory Visit: Payer: Self-pay | Admitting: Family Medicine

## 2021-11-14 DIAGNOSIS — I10 Essential (primary) hypertension: Secondary | ICD-10-CM

## 2021-11-15 ENCOUNTER — Other Ambulatory Visit: Payer: Self-pay | Admitting: Family Medicine

## 2021-11-15 DIAGNOSIS — I10 Essential (primary) hypertension: Secondary | ICD-10-CM

## 2021-11-15 NOTE — Telephone Encounter (Signed)
Requested medication (s) are due for refill today   Yes  Requested medication (s) are on the active medication list Yes  Future visit scheduled Yes for 11/29/21  LOV 06/12/21.   Note to clinic-Protocol refused do to most recent labs are from outside system .  Latest Potassium on the chart 08/04/21 4.5 and Cre 0.7 Routing to physician for review.    Requested Prescriptions  Pending Prescriptions Disp Refills   ramipril (ALTACE) 10 MG capsule [Pharmacy Med Name: RAMIPRIL 10 MG CAP] 30 capsule 0    Sig: TAKE 1 CAPSULE BY MOUTH DAILY     Cardiovascular:  ACE Inhibitors Failed - 11/14/2021  1:36 PM      Failed - Cr in normal range and within 180 days    Creatinine  Date Value Ref Range Status  12/11/2017 0.7 0.5 - 1.1 Final   Creatinine, Ser  Date Value Ref Range Status  10/15/2016 0.72 0.44 - 1.00 mg/dL Final          Failed - K in normal range and within 180 days    Potassium  Date Value Ref Range Status  12/11/2017 6.0 (A) 3.4 - 5.3 Final          Passed - Patient is not pregnant      Passed - Last BP in normal range    BP Readings from Last 1 Encounters:  06/12/21 136/70          Passed - Valid encounter within last 6 months    Recent Outpatient Visits           5 months ago Foot sprain, left, initial encounter   [facility] Juline Patch, MD   8 months ago Chest wall pain   [facility] Juline Patch, MD   1 year ago Essential hypertension   [facility] Juline Patch, MD   1 year ago Achilles tendinitis of left lower extremity   [facility] Juline Patch, MD   1 year ago Iliotibial band syndrome of right side   [facility] Juline Patch, MD       Future Appointments             In 2 weeks Juline Patch, MD [facility], [facility]

## 2021-11-16 NOTE — Telephone Encounter (Signed)
[facility] called and spoke to [place], [place] about the refill(s) ramipril requested. Advised it was sent on 11/15/21 #30/0 refill(s). She stated that medication has been filled and ready for pick up.   Requested Prescriptions  Pending Prescriptions Disp Refills   ramipril (ALTACE) 10 MG capsule [Pharmacy Med Name: RAMIPRIL 10 MG CAP] 30 capsule 0    Sig: TAKE 1 CAPSULE BY MOUTH DAILY     Cardiovascular:  ACE Inhibitors Failed - 11/15/2021  5:07 PM      Failed - Cr in normal range and within 180 days    Creatinine  Date Value Ref Range Status  12/11/2017 0.7 0.5 - 1.1 Final   Creatinine, Ser  Date Value Ref Range Status  10/15/2016 0.72 0.44 - 1.00 mg/dL Final          Failed - K in normal range and within 180 days    Potassium  Date Value Ref Range Status  12/11/2017 6.0 (A) 3.4 - 5.3 Final          Passed - Patient is not pregnant      Passed - Last BP in normal range    BP Readings from Last 1 Encounters:  06/12/21 136/70          Passed - Valid encounter within last 6 months    Recent Outpatient Visits           5 months ago Foot sprain, left, initial encounter   [facility] Juline Patch, MD   8 months ago Chest wall pain   [facility] Juline Patch, MD   1 year ago Essential hypertension   [facility] Juline Patch, MD   1 year ago Achilles tendinitis of left lower extremity   [facility] Juline Patch, MD   1 year ago Iliotibial band syndrome of right side   [facility] Juline Patch, MD       Future Appointments             In 1 week Juline Patch, MD [facility] [place], [facility]

## 2021-11-29 ENCOUNTER — Ambulatory Visit: Payer: Medicare PPO | Admitting: Family Medicine

## 2021-12-08 ENCOUNTER — Ambulatory Visit: Payer: Medicare PPO | Admitting: Family Medicine

## 2021-12-08 ENCOUNTER — Other Ambulatory Visit: Payer: Self-pay

## 2021-12-08 ENCOUNTER — Encounter: Payer: Self-pay | Admitting: Family Medicine

## 2021-12-08 VITALS — BP 130/84 | HR 76 | Ht 68.0 in | Wt 166.0 lb

## 2021-12-08 DIAGNOSIS — F419 Anxiety disorder, unspecified: Secondary | ICD-10-CM

## 2021-12-08 DIAGNOSIS — E7800 Pure hypercholesterolemia, unspecified: Secondary | ICD-10-CM

## 2021-12-08 DIAGNOSIS — I8393 Asymptomatic varicose veins of bilateral lower extremities: Secondary | ICD-10-CM | POA: Diagnosis not present

## 2021-12-08 DIAGNOSIS — M720 Palmar fascial fibromatosis [Dupuytren]: Secondary | ICD-10-CM

## 2021-12-08 DIAGNOSIS — I10 Essential (primary) hypertension: Secondary | ICD-10-CM

## 2021-12-08 DIAGNOSIS — K219 Gastro-esophageal reflux disease without esophagitis: Secondary | ICD-10-CM | POA: Diagnosis not present

## 2021-12-08 MED ORDER — RAMIPRIL 10 MG PO CAPS: 10.0000 mg | ORAL_CAPSULE | Freq: Every day | ORAL | 1 refills | Status: DC

## 2021-12-08 MED ORDER — SIMVASTATIN 20 MG PO TABS: 20.0000 mg | ORAL_TABLET | Freq: Every day | ORAL | 1 refills | Status: DC

## 2021-12-08 MED ORDER — OMEPRAZOLE 20 MG PO CPDR: 20.0000 mg | DELAYED_RELEASE_CAPSULE | Freq: Every day | ORAL | 1 refills | Status: DC

## 2021-12-08 MED ORDER — PAROXETINE HCL 10 MG PO TABS: 10.0000 mg | ORAL_TABLET | Freq: Every day | ORAL | 1 refills | Status: DC

## 2021-12-08 MED ORDER — METOPROLOL TARTRATE 100 MG PO TABS: 100.0000 mg | ORAL_TABLET | Freq: Two times a day (BID) | ORAL | 1 refills | Status: DC

## 2021-12-08 NOTE — Progress Notes (Signed)
Date:  12/08/2021   Name:  Bianca Hawkins   DOB:  09-27-1946   MRN:  [id number]   Chief Complaint: Hypertension, Hyperlipidemia, Gastroesophageal Reflux, and Anxiety  Hypertension This is a chronic problem. The current episode started more than 1 year ago. The problem has been gradually improving since onset. The problem is controlled. Associated symptoms include anxiety. Pertinent negatives include no blurred vision, chest pain, headaches, malaise/fatigue, neck pain, orthopnea, palpitations, peripheral edema, PND, shortness of breath or sweats. There are no associated agents to hypertension. There are no known risk factors for coronary artery disease. Past treatments include beta blockers and ACE inhibitors. The current treatment provides moderate improvement. There are no compliance problems.  There is no history of angina, kidney disease, CAD/MI, CVA, heart failure, left ventricular hypertrophy, PVD or retinopathy. There is no history of chronic renal disease, a hypertension causing med or renovascular disease.  Hyperlipidemia This is a chronic problem. The current episode started more than 1 year ago. The problem is controlled. Recent lipid tests were reviewed and are normal. She has no history of chronic renal disease, diabetes, hypothyroidism, liver disease, obesity or nephrotic syndrome. Pertinent negatives include no chest pain, focal sensory loss, focal weakness, leg pain, myalgias or shortness of breath. Current antihyperlipidemic treatment includes statins. The current treatment provides moderate improvement of lipids. There are no compliance problems.  Risk factors for coronary artery disease include dyslipidemia and hypertension.  Gastroesophageal Reflux She reports no abdominal pain, no belching, no chest pain, no coughing, no dysphagia, no early satiety, no globus sensation, no heartburn, no nausea, no sore throat, no stridor, no tooth decay or no wheezing. This is a chronic  problem. The problem has been gradually improving. The symptoms are aggravated by certain foods. Pertinent negatives include no anemia, fatigue, melena, muscle weakness, orthopnea or weight loss. She has tried a PPI for the symptoms. The treatment provided moderate relief.  Anxiety Presents for follow-up visit. Patient reports no chest pain, decreased concentration, depressed mood, dizziness, excessive worry, insomnia, irritability, nausea, nervous/anxious behavior, palpitations, restlessness, shortness of breath or suicidal ideas. Symptoms occur occasionally. The severity of symptoms is mild.     Lab Results  Component Value Date   NA 140 08/04/2021   K 4.5 08/04/2021   CO2 32 10/15/2016   GLUCOSE 93 10/15/2016   BUN 15 08/04/2021   CREATININE 0.7 08/04/2021   CALCIUM 10.2 08/04/2021   GFRNONAA >60 10/15/2016   Lab Results  Component Value Date   CHOL 148 08/04/2021   HDL 60 08/04/2021   LDLCALC 66 08/04/2021   TRIG 106 08/04/2021   CHOLHDL 2.5 09/25/2016   Lab Results  Component Value Date   TSH 0.80 08/04/2021   Lab Results  Component Value Date   HGBA1C 6.8 08/04/2021   Lab Results  Component Value Date   WBC 7.8 09/01/2019   HGB 12.2 09/01/2019   HCT 37.0 09/01/2019   MCV 87.3 09/01/2019   PLT 135 (L) 09/01/2019   Lab Results  Component Value Date   ALT 17 08/04/2021   AST 19 08/04/2021   ALKPHOS 50 10/15/2016   BILITOT 0.4 10/15/2016   No results found for: 25OHVITD2, 25OHVITD3, VD25OH   Review of Systems  Constitutional:  Negative for chills, fatigue, fever, irritability, malaise/fatigue and weight loss.  HENT:  Negative for drooling, ear discharge, ear pain and sore throat.   Eyes:  Negative for blurred vision.  Respiratory:  Negative for cough, shortness of breath and wheezing.  Cardiovascular:  Negative for chest pain, palpitations, orthopnea, leg swelling and PND.  Gastrointestinal:  Negative for abdominal pain, blood in stool, constipation,  diarrhea, dysphagia, heartburn, melena and nausea.  Endocrine: Negative for polydipsia.  Genitourinary:  Negative for dysuria, frequency, hematuria and urgency.  Musculoskeletal:  Negative for back pain, myalgias, muscle weakness and neck pain.  Skin:  Negative for rash.  Allergic/Immunologic: Negative for environmental allergies.  Neurological:  Negative for dizziness, focal weakness and headaches.  Hematological:  Does not bruise/bleed easily.  Psychiatric/Behavioral:  Negative for decreased concentration and suicidal ideas. The patient is not nervous/anxious and does not have insomnia.    Patient Active Problem List   Diagnosis Date Noted   Pure hypercholesterolemia 09/24/2018   History of biliary T-tube placement 01/22/2018   Recurrent major depressive episodes ([place]) 01/22/2018   Age-related osteoporosis without current pathological fracture 09/09/2017   Hyperlipidemia due to type 2 diabetes mellitus ([place]) 09/09/2017   Panic attacks 04/16/2017   Hyperparathyroidism, primary ([place]) 03/01/2017   Multinodular goiter 03/01/2017   Essential hypertension 06/28/2016   Chronic anxiety 06/28/2016   Type 2 diabetes mellitus without complication, without long-term current use of insulin ([place]) 08/09/2015   Familial multiple lipoprotein-type hyperlipidemia 08/09/2015   Pain in joint 08/09/2015   Encounter for general adult medical examination without abnormal findings 08/09/2015   Chronic obstructive pulmonary disease (COPD) ([place]) 08/09/2015   H/O: depression 08/09/2015   Gastroesophageal reflux disease without esophagitis 08/09/2015   Blood glucose elevated 08/09/2015   Encounter for screening for osteoporosis 08/09/2015   Need for vaccination 08/09/2015   Chronic MEE (middle ear effusion) 08/09/2015   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    Allergies  Allergen Reactions   Other Shortness Of Breath    Pepper   Eggs Or Egg-Derived Products    Sulfa Antibiotics     Past Surgical  History:  Procedure Laterality Date   BIOPSY BREAST     benign   BREAST BIOPSY Right 2014   core with clips;benign per pt   COLONOSCOPY  2008   cleared for 10 years/ occults- 07/22/2015- negative   INNER EAR SURGERY     benign tumor behind eardrum   PARATHYROID EXPLORATION     nodule removed   THYROID EXPLORATION  04/2017   nodule removed    Social History   Tobacco Use   Smoking status: Former    Packs/day: 0.50    Years: 30.00    Pack years: 15.00    Types: Cigarettes    Quit date: 2003    Years since quitting: 20.0   Smokeless tobacco: Never  Vaping Use   Vaping Use: Never used  Substance Use Topics   Alcohol use: No    Alcohol/week: 0.0 standard drinks   Drug use: No     Medication list has been reviewed and updated.  Current Meds  Medication Sig   ACCU-CHEK AVIVA PLUS test strip USE AS DIRECTED   aspirin 81 MG tablet Take 1 tablet by mouth daily.   cetirizine (ZYRTEC) 5 MG tablet Take 1 tablet by mouth as needed.   Cholecalciferol 25 MCG (1000 UT) tablet Take 1,000 mg by mouth daily.   diclofenac Sodium (VOLTAREN) 1 % GEL Apply 2 g topically 4 (four) times daily.   fluticasone (FLONASE) 50 MCG/ACT nasal spray Place 1 spray into both nostrils as needed.   glipiZIDE (GLUCOTROL XL) 5 MG 24 hr tablet Take 5 mg by mouth daily with breakfast. Dr Honor Junes   linagliptin [facility]) 5  MG TABS tablet Take 5 mg by mouth daily. Dr Honor Junes   metFORMIN (GLUCOPHAGE) 1000 MG tablet Take 1 tablet (1,000 mg total) by mouth 2 (two) times daily. (Patient taking differently: Take 1,000 mg by mouth 2 (two) times daily. Dr Honor Junes)   metoprolol tartrate (LOPRESSOR) 100 MG tablet TAKE 1 TABLET BY MOUTH TWICE DAILY   omeprazole (PRILOSEC) 20 MG capsule TAKE 1 CAPSULE BY MOUTH DAILY   PARoxetine (PAXIL) 10 MG tablet TAKE 1 TABLET BY MOUTH DAILY   ramipril (ALTACE) 10 MG capsule TAKE 1 CAPSULE BY MOUTH DAILY   simvastatin (ZOCOR) 20 MG tablet TAKE 1 TABLET BY MOUTH DAILY    PHQ  2/9 Scores 12/08/2021 10/25/2021 06/12/2021 10/24/2020  PHQ - 2 Score 0 0 0 0  PHQ- 9 Score 0 - - 0    GAD 7 : Generalized Anxiety Score 12/08/2021 06/12/2021 07/22/2020 05/23/2020  Nervous, Anxious, on Edge 0 0 0 0  Control/stop worrying 0 0 0 0  Worry too much - different things 0 0 0 0  Trouble relaxing 0 0 0 0  Restless 0 0 0 0  Easily annoyed or irritable 0 0 0 0  Afraid - awful might happen 0 0 0 0  Total GAD 7 Score 0 0 0 0  Anxiety Difficulty Not difficult at all Not difficult at all - -    BP Readings from Last 3 Encounters:  12/08/21 130/84  06/12/21 136/70  03/02/21 130/84    Physical Exam  Wt Readings from Last 3 Encounters:  12/08/21 166 lb (75.3 kg)  06/12/21 167 lb (75.8 kg)  03/02/21 173 lb (78.5 kg)    BP 130/84    Pulse 76    Ht 5\' 8"  (1.727 m)    Wt 166 lb (75.3 kg)    BMI 25.24 kg/m   Assessment and Plan:  1. Essential hypertension Chronic.  Controlled.  Stable.  Blood pressure today is noted to be 130/84.  Continue ramipril 10 mg once a day and metoprolol 100 mg twice a day.  Review of renal function panel is acceptable and will recheck in 6 months - ramipril (ALTACE) 10 MG capsule; Take 1 capsule (10 mg total) by mouth daily.  Dispense: 90 capsule; Refill: 1 - metoprolol tartrate (LOPRESSOR) 100 MG tablet; Take 1 tablet (100 mg total) by mouth 2 (two) times daily.  Dispense: 180 tablet; Refill: 1  2. Gastroesophageal reflux disease without esophagitis Chronic.  Controlled.  Stable.  Continue omeprazole 20 mg once a day. - omeprazole (PRILOSEC) 20 MG capsule; Take 1 capsule (20 mg total) by mouth daily.  Dispense: 90 capsule; Refill: 1  3. Chronic anxiety Chronic.  Controlled.  Stable.  PHQ is 0 Gad score is 0 and firmness however this is very very good.  We will definitely continue Paxil 30 mg once a day. - PARoxetine (PAXIL) 10 MG tablet; Take 1 tablet (10 mg total) by mouth daily.  Dispense: 90 tablet; Refill: 1  4. Pure  hypercholesterolemia Controlled.  Stable.  Chronic.  Review of last lipid panel was acceptable and we will continue at simvastatin 20 mg once a day. - simvastatin (ZOCOR) 20 MG tablet; Take 1 tablet (20 mg total) by mouth daily.  Dispense: 90 tablet; Refill: 1  5. Asymptomatic varicose veins of both lower extremities This is a new symptom sign that is been brought to our attention by the patient that she has multiple varicose veins that are superficial on both legs.  She would like  to have these evaluated because that sometimes there is some discomfort.  There is no significant swelling and no evidence of an thrombophlebitis circumstance. - Ambulatory referral to Vascular Surgery  6. Dupuytren's contracture Chronic.  Relatively stable.  With slow growth perhaps.  Initially was noted in the right hand but has not progressed 7 8 patient has full range of motion and is able to lie her hand flat on the table.  Patient is also noticed the beginnings of a contracture on the left hand of the same tendon involving the fourth digit.  This is consistent with Dupuytren's and we will continue to watch at this time and patient's been encouraged to stretch and massage to prevent further progression.

## 2021-12-08 NOTE — Patient Instructions (Signed)
Dupuytren's Contracture Dupuytren's contracture is a condition in which tissue under the skin of the palm becomes thick. This causes one or more of the fingers to curl inward (contract) toward the palm. After a while, the fingers may not be able to straighten out. This condition affects some or all of the fingers and the palm of the hand. This condition may affect one or both hands. Dupuytren's contracture is a long-term (chronic) condition that develops (progresses) slowly over time. There is no cure, but symptoms can be managed and progression can be slowed with treatment. This condition is usually not dangerous or painful, but it can interfere with everyday tasks. What are the causes? This condition is caused by tissue (fascia) in the palm that gets thicker and tighter. When the fascia thickens, it pulls on the cords of tissue (tendons) that control finger movement. This causes the fingers to contract. The cause of fascia thickening is not known. However, the condition is often passed along from parent to child (inherited). What increases the risk? The following factors may make you more likely to develop this condition: Being 61 years of age or older. Being female. Having a family history of this condition. Using tobacco products, including cigarettes, chewing tobacco, and e-cigarettes. Drinking alcohol excessively. Having diabetes. Having a seizure disorder. What are the signs or symptoms? Early symptoms of this condition may include: Thick, puckered skin on the hand. One or more lumps (nodules) on the palm. Nodules may be tender when they first appear, but they are generally painless. Later symptoms of this condition may include: Thick cords of tissue in the palm. Fingers curled up toward the palm. Inability to straighten the fingers into their normal position. Though this condition is usually painless, you may have discomfort when holding or grabbing objects. How is this  diagnosed? This condition is diagnosed with a physical exam, which may include: Looking at your hands and feeling your palms. This is to check for thickened fascia and nodules. Measuring finger motion. Doing the [place] tabletop test. You may be asked to try to put your hand on a surface, with your palm down and your fingers straight out. How is this treated? There is no cure for this condition, but treatment can relieve discomfort and make symptoms more manageable. Treatment options may include: Physical therapy. This can strengthen your hand and increase flexibility. Occupational therapy. This can help you with everyday tasks that may be more difficult because of your condition. Shots (injections). Substances may be injected into your hand, such as: Medicines that help to decrease swelling (corticosteroids). Proteins (collagenase) to weaken thick tissue. After a collagenase injection, your health care provider may stretch your fingers. Needle aponeurotomy. A needle is pushed through the skin and into the fascia. Moving the needle against the fascia can weaken or break up the thick tissue. Surgery. This may be needed if your condition causes discomfort or interferes with everyday activities. Physical therapy is usually needed after surgery. No treatment is guaranteed to cure this condition. Recurrence of symptoms is common. Follow these instructions at home: Hand care Take these actions to help protect your hand from possible injury: Use tools that have padded grips. Wear protective gloves while you work with your hands. Avoid repetitive hand movements. General instructions Take over-the-counter and prescription medicines only as told by your health care provider. Manage any other conditions that you have, such as diabetes. If physical therapy was prescribed, do exercises as told by your health care provider. Do not use  any products that contain nicotine or tobacco, such as cigarettes,  e-cigarettes, and chewing tobacco. If you need help quitting, ask your health care provider. If you drink alcohol: Limit how much you have to: 0-1 drink a day for women who are not pregnant. 0-2 drinks a day for men. Be aware of how much alcohol is in your drink. In the [place] one drink equals one 12 oz bottle of beer (355 mL), one 5 oz glass of wine (148 mL), or one 1 oz glass of hard liquor (44 mL). Keep all follow-up visits as told by your health care provider. This is important. Contact a health care provider if: You develop new symptoms, or your symptoms get worse. You have pain that gets worse or does not get better with medicine. You have difficulty or discomfort with everyday tasks. You develop numbness or tingling. Get help right away if: You have severe pain. Your fingers change color or become unusually cold. Summary Dupuytren's contracture is a condition in which tissue under the skin of the palm becomes thick. This condition is caused by tissue (fascia) that thickens. When it thickens, it pulls on the cords of tissue (tendons) that control finger movement and makes the fingers contract. You are more likely to develop this condition if you are a man, are over 70 years of age, have a family history of the condition, and drink a lot of alcohol. This condition can be treated with physical and occupational therapy, injections, and surgery. Follow instructions about how to care for your hand. Get help right away if you have severe pain or your fingers change color or become cold. This information is not intended to replace advice given to you by your health care provider. Make sure you discuss any questions you have with your health care provider. Document Revised: 02/07/2021 Document Reviewed: 02/07/2021 Elsevier Patient Education  [place].

## 2021-12-25 DIAGNOSIS — H35033 Hypertensive retinopathy, bilateral: Secondary | ICD-10-CM | POA: Diagnosis not present

## 2021-12-25 DIAGNOSIS — E119 Type 2 diabetes mellitus without complications: Secondary | ICD-10-CM | POA: Diagnosis not present

## 2021-12-25 LAB — HM DIABETES EYE EXAM

## 2022-01-08 ENCOUNTER — Other Ambulatory Visit: Payer: Self-pay

## 2022-01-08 ENCOUNTER — Ambulatory Visit (INDEPENDENT_AMBULATORY_CARE_PROVIDER_SITE_OTHER): Payer: Medicare PPO | Admitting: Vascular Surgery

## 2022-01-08 ENCOUNTER — Encounter (INDEPENDENT_AMBULATORY_CARE_PROVIDER_SITE_OTHER): Payer: Self-pay | Admitting: Vascular Surgery

## 2022-01-08 VITALS — BP 177/82 | HR 70 | Resp 16 | Ht 68.0 in | Wt 169.4 lb

## 2022-01-08 DIAGNOSIS — E119 Type 2 diabetes mellitus without complications: Secondary | ICD-10-CM | POA: Diagnosis not present

## 2022-01-08 DIAGNOSIS — I83813 Varicose veins of bilateral lower extremities with pain: Secondary | ICD-10-CM | POA: Diagnosis not present

## 2022-01-08 DIAGNOSIS — I872 Venous insufficiency (chronic) (peripheral): Secondary | ICD-10-CM

## 2022-01-08 DIAGNOSIS — E785 Hyperlipidemia, unspecified: Secondary | ICD-10-CM

## 2022-01-08 DIAGNOSIS — G4762 Sleep related leg cramps: Secondary | ICD-10-CM

## 2022-01-08 DIAGNOSIS — E1169 Type 2 diabetes mellitus with other specified complication: Secondary | ICD-10-CM | POA: Diagnosis not present

## 2022-01-08 DIAGNOSIS — I83819 Varicose veins of unspecified lower extremities with pain: Secondary | ICD-10-CM | POA: Insufficient documentation

## 2022-01-08 NOTE — Progress Notes (Signed)
MRN : [id number]  Bianca Hawkins is a 76 y.o. (09/28/1946) female who presents with chief complaint of check legs.  History of Present Illness:  The patient is seen for evaluation of symptomatic varicose veins. The patient relates heaviness and aching which worsened steadily throughout the course of the day, particularly with standing. The patient also notes an itching discomfort over the varicosities, particularly with prolonged dependent positions. The symptoms are significantly improved with elevation.    There is no history of DVT, PE or superficial thrombophlebitis. There is no history of ulceration or hemorrhage. The patient has a significant family history of varicose veins.  The patient has not worn graduated compression in the past. At the present time the patient has not been using over-the-counter analgesics. There is no history of prior surgical intervention or sclerotherapy.  The patient is also c/o night time leg cramps. The patient describes it as a cramping or Charley horse type pain. The patient notes the pain isn't associated with activity and is not very consistent day to day. The pain seems to be variable with time. Typically the pain occurs with varying positions and seems to progress until the leg is stretched. The pain has been progressive over the past several years which has prompted the concern for evaluation. The patient states this inability to walk has a significant negative impact on her quality of life and daily activities.  The patient denies a history of degenerative spine disease.  The patient denies rest pain. The patient denies dangling off the affected extremity during the night for pain relief. There are no open wounds or sores at this time.  No prior vascular interventions or vascular surgeries.  The patient denies amaurosis fugax or recent TIA symptoms. There are no recent neurological changes noted. The patient denies history of DVT, PE or  superficial thrombophlebitis. The patient denies recent episodes of angina or shortness of breath.       Current Meds  Medication Sig   ACCU-CHEK AVIVA PLUS test strip USE AS DIRECTED   aspirin 81 MG tablet Take 1 tablet by mouth daily.   cetirizine (ZYRTEC) 5 MG tablet Take 1 tablet by mouth as needed.   Cholecalciferol 25 MCG (1000 UT) tablet Take 1,000 mg by mouth daily.   fluticasone (FLONASE) 50 MCG/ACT nasal spray Place 1 spray into both nostrils as needed.   glipiZIDE (GLUCOTROL XL) 5 MG 24 hr tablet Take 5 mg by mouth daily with breakfast. Dr Honor Junes   linagliptin (TRADJENTA) 5 MG TABS tablet Take 5 mg by mouth daily. Dr Honor Junes   metFORMIN (GLUCOPHAGE) 1000 MG tablet Take 1 tablet (1,000 mg total) by mouth 2 (two) times daily. (Patient taking differently: Take 1,000 mg by mouth 2 (two) times daily. Dr Honor Junes)   metoprolol tartrate (LOPRESSOR) 100 MG tablet Take 1 tablet (100 mg total) by mouth 2 (two) times daily.   omeprazole (PRILOSEC) 20 MG capsule Take 1 capsule (20 mg total) by mouth daily.   PARoxetine (PAXIL) 10 MG tablet Take 1 tablet (10 mg total) by mouth daily.   ramipril (ALTACE) 10 MG capsule Take 1 capsule (10 mg total) by mouth daily.   simvastatin (ZOCOR) 20 MG tablet Take 1 tablet (20 mg total) by mouth daily.    Past Medical History:  Diagnosis Date   Allergy    Anxiety    Depression    reccurent depression to due situational disturbance   Diabetes mellitus without complication (HCC)    GERD (  gastroesophageal reflux disease)    Hyperlipidemia    Hypertension    Osteoporosis     Past Surgical History:  Procedure Laterality Date   BIOPSY BREAST     benign   BREAST BIOPSY Right 2014   core with clips;benign per pt   COLONOSCOPY  2008   cleared for 10 years/ occults- 07/22/2015- negative   INNER EAR SURGERY     benign tumor behind eardrum   PARATHYROID EXPLORATION     nodule removed   THYROID EXPLORATION  04/2017   nodule removed     Social History Social History   Tobacco Use   Smoking status: Former    Packs/day: 0.50    Years: 30.00    Pack years: 15.00    Types: Cigarettes    Quit date: 2003    Years since quitting: 20.1   Smokeless tobacco: Never  Vaping Use   Vaping Use: Never used  Substance Use Topics   Alcohol use: No    Alcohol/week: 0.0 standard drinks   Drug use: No    Family History Family History  Problem Relation Age of Onset   Ulcerative colitis Mother    Heart attack Father    Heart disease Sister    Birth defects Sister    Healthy Brother    Stroke Daughter    Healthy Son    Breast cancer Neg Hx     Allergies  Allergen Reactions   Other Shortness Of Breath    Pepper   Eggs Or Egg-Derived Products    Sulfa Antibiotics      REVIEW OF SYSTEMS (Negative unless checked)  Constitutional: [] Weight loss  [] Fever  [] Chills Cardiac: [] Chest pain   [] Chest pressure   [] Palpitations   [] Shortness of breath when laying flat   [] Shortness of breath with exertion. Vascular:  [] Pain in legs with walking   [x] Pain in legs at rest  [] History of DVT   [] Phlebitis   [x] Swelling in legs   [x] Varicose veins   [] Non-healing ulcers Pulmonary:   [] Uses home oxygen   [] Productive cough   [] Hemoptysis   [] Wheeze  [] COPD   [] Asthma Neurologic:  [] Dizziness   [] Seizures   [] History of stroke   [] History of TIA  [] Aphasia   [] Vissual changes   [] Weakness or numbness in arm   [] Weakness or numbness in leg Musculoskeletal:   [] Joint swelling   [] Joint pain   [] Low back pain Hematologic:  [] Easy bruising  [] Easy bleeding   [] Hypercoagulable state   [] Anemic Gastrointestinal:  [] Diarrhea   [] Vomiting  [] Gastroesophageal reflux/heartburn   [] Difficulty swallowing. Genitourinary:  [] Chronic kidney disease   [] Difficult urination  [] Frequent urination   [] Blood in urine Skin:  [] Rashes   [] Ulcers  Psychological:  [] History of anxiety   []  History of major depression.  Physical Examination  Vitals:    01/08/22 1516  BP: (!) 177/82  Pulse: 70  Resp: 16  Weight: 169 lb 6.4 oz (76.8 kg)  Height: 5\' 8"  (1.727 m)   Body mass index is 25.76 kg/m. Gen: WD/WN, NAD Head: [place]/AT, No temporalis wasting.  Ear/Nose/Throat: Hearing grossly intact, nares w/o erythema or drainage Eyes: PER, EOMI, sclera nonicteric.  Neck: Supple, no masses.  No bruit or JVD.  Pulmonary:  Good air movement, no audible wheezing, no use of accessory muscles.  Cardiac: RRR, normal S1, S2, no Murmurs. Vascular:  Large varicosities present extensively bilaterally.  Moderate venous stasis changes to the legs bilaterally.  2+ soft pitting edema  Vessel  Right Left  Radial Palpable Palpable  PT Palpable Palpable  DP Palpable Palpable  Gastrointestinal: soft, non-distended. No guarding/no peritoneal signs.  Musculoskeletal: M/S 5/5 throughout.  No visible deformity.  Neurologic: CN 2-12 intact. Pain and light touch intact in extremities.  Symmetrical.  Speech is fluent. Motor exam as listed above. Psychiatric: Judgment intact, Mood & affect appropriate for pt's clinical situation. Dermatologic: Venous rashes no ulcers noted.  No changes consistent with cellulitis.   CBC Lab Results  Component Value Date   WBC 7.8 09/01/2019   HGB 12.2 09/01/2019   HCT 37.0 09/01/2019   MCV 87.3 09/01/2019   PLT 135 (L) 09/01/2019    BMET    Component Value Date/Time   NA 140 08/04/2021 0000   K 4.5 08/04/2021 0000   CL 101 10/15/2016 1837   CO2 32 10/15/2016 1837   GLUCOSE 93 10/15/2016 1837   BUN 15 08/04/2021 0000   CREATININE 0.7 08/04/2021 0000   CREATININE 0.72 10/15/2016 1837   CALCIUM 10.2 08/04/2021 0000   GFRNONAA >60 10/15/2016 1837   GFRAA >60 10/15/2016 1837   CrCl cannot be calculated (Patient's most recent lab result is older than the maximum 21 days allowed.).  COAG No results found for: INR, PROTIME  Radiology No results found.   Assessment/Plan 1. Varicose veins of both lower extremities with  pain  Recommend:  The patient has large symptomatic varicose veins that are painful and associated with swelling.  I have had a long discussion with the patient regarding  varicose veins and why they cause symptoms.  Patient will begin wearing graduated compression stockings class 1 on a daily basis, beginning first thing in the morning and removing them in the evening. The patient is instructed specifically not to sleep in the stockings.    The patient  will also begin using over-the-counter analgesics such as Motrin 600 mg po TID to help control the symptoms.    In addition, behavioral modification including elevation during the day will be initiated.    Pending the results of these changes the  patient will be reevaluated in three months.   An  ultrasound of the venous system will be obtained.   Further plans will be based on the ultrasound results and whether conservative therapies are successful at eliminating the pain and swelling.   - VAS [place] LOWER EXTREMITY VENOUS REFLUX; Future  2. Chronic venous insufficiency No surgery or intervention at this point in time.    I have had a long discussion with the patient regarding venous insufficiency and why it  causes symptoms. I have discussed with the patient the chronic skin changes that accompany venous insufficiency and the long term sequela such as infection and ulceration.  Patient will begin wearing graduated compression stockings class 1 (20-30 mmHg) or compression wraps on a daily basis a prescription was given. The patient will put the stockings on first thing in the morning and removing them in the evening. The patient is instructed specifically not to sleep in the stockings.    In addition, behavioral modification including several periods of elevation of the lower extremities during the day will be continued. I have demonstrated that proper elevation is a position with the ankles at heart level.  The patient is instructed to begin  routine exercise, especially walking on a daily basis  Patient should undergo duplex ultrasound of the venous system to ensure that DVT or reflux is not present.  Following the review of the ultrasound the patient  will follow up in 2-3 months to reassess the degree of swelling and the control that graduated compression stockings or compression wraps  is offering.   The patient can be assessed for a Lymph Pump at that time  - VAS [place] LOWER EXTREMITY VENOUS REFLUX; Future  3. Nocturnal leg cramps Recommend:  The patient is describing Charley horse type leg cramps. No invasive studies, angiography or surgery at this time.    I have reviewed homeopathic remedies such as Cider vinegar or mustard; placing a bar of soap at the bottom of the bed. Quinine is also an option Magnesium supplementation at bedtime was also reviewed.  The patient should continue walking and begin a more formal exercise program.  The patient should continue antiplatelet therapy and aggressive treatment of the lipid abnormalities  The patient should continue wearing graduated compression socks 10-15 mmHg strength to control any mild edema.  The patient will follow up with me on a PRN basis.    4. Type 2 diabetes mellitus without complication, without long-term current use of insulin (HCC) Continue hypoglycemic medications as already ordered, these medications have been reviewed and there are no changes at this time.  Hgb A1C to be monitored as already arranged by primary service   5. Hyperlipidemia due to type 2 diabetes mellitus ([place]) Continue statin as ordered and reviewed, no changes at this time     Hortencia Pilar, MD  01/08/2022 3:40 PM

## 2022-02-09 DIAGNOSIS — E1159 Type 2 diabetes mellitus with other circulatory complications: Secondary | ICD-10-CM | POA: Diagnosis not present

## 2022-02-09 DIAGNOSIS — E042 Nontoxic multinodular goiter: Secondary | ICD-10-CM | POA: Diagnosis not present

## 2022-02-09 DIAGNOSIS — I152 Hypertension secondary to endocrine disorders: Secondary | ICD-10-CM | POA: Diagnosis not present

## 2022-02-09 DIAGNOSIS — M810 Age-related osteoporosis without current pathological fracture: Secondary | ICD-10-CM | POA: Diagnosis not present

## 2022-02-09 LAB — HM DIABETES FOOT EXAM: HM Diabetic Foot Exam: NORMAL

## 2022-02-09 LAB — HEMOGLOBIN A1C: Hemoglobin A1C: 6.7

## 2022-02-15 ENCOUNTER — Encounter (INDEPENDENT_AMBULATORY_CARE_PROVIDER_SITE_OTHER): Payer: Medicare PPO

## 2022-02-15 ENCOUNTER — Ambulatory Visit (INDEPENDENT_AMBULATORY_CARE_PROVIDER_SITE_OTHER): Payer: Medicare PPO | Admitting: Vascular Surgery

## 2022-02-23 ENCOUNTER — Encounter: Payer: Self-pay | Admitting: Family Medicine

## 2022-02-23 ENCOUNTER — Other Ambulatory Visit: Payer: Self-pay

## 2022-02-23 DIAGNOSIS — Z1231 Encounter for screening mammogram for malignant neoplasm of breast: Secondary | ICD-10-CM

## 2022-03-08 IMAGING — MG DIGITAL DIAGNOSTIC BILAT W/ TOMO W/ CAD
6 of 12 series · 6 of 36 positions shown · non-contrast
Comparison: Previous exam(s).

CLINICAL DATA: 74-year-old female with diffuse OUTER RIGHT breast
and RIGHT axillary pain and fullness following fall 3 weeks ago.
Also for annual bilateral mammogram.

EXAM:
DIGITAL DIAGNOSTIC BILATERAL MAMMOGRAM WITH TOMOSYNTHESIS AND CAD;
ULTRASOUND RIGHT BREAST LIMITED
TECHNIQUE: Bilateral digital diagnostic mammography and breast tomosynthesis
was performed. The images were evaluated with computer-aided
detection.; Targeted ultrasound examination of the right breast was
performed

[R MLO synth-2D (1 of 2)]
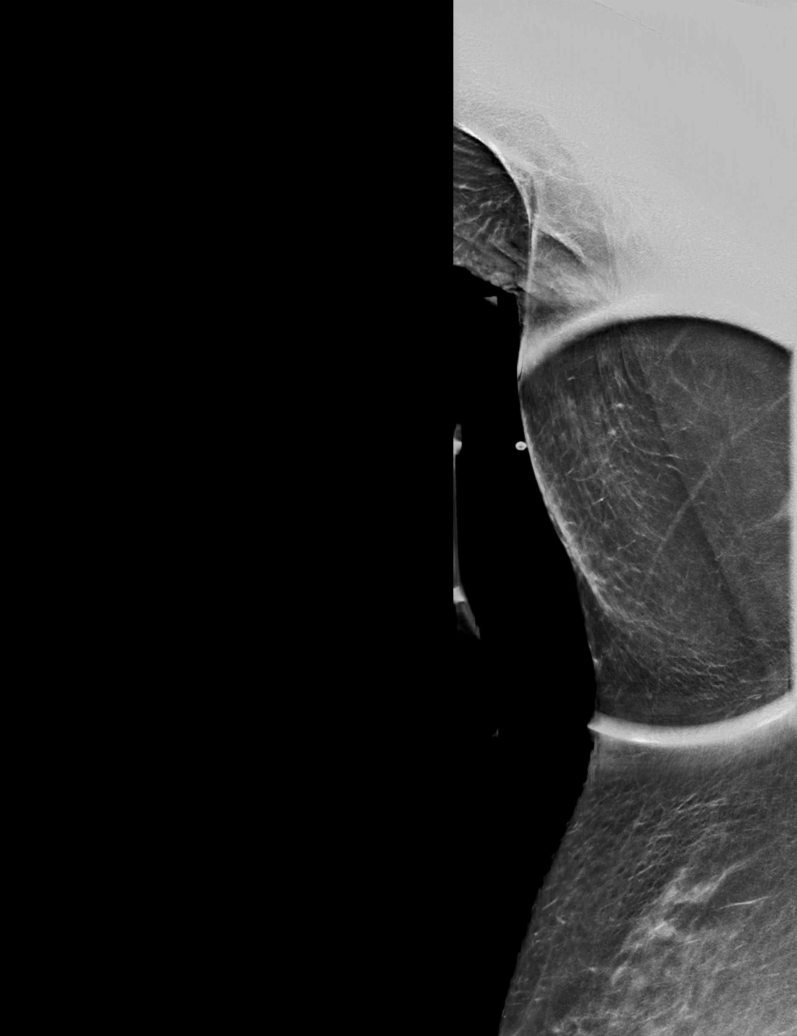

[L MLO synth-2D]
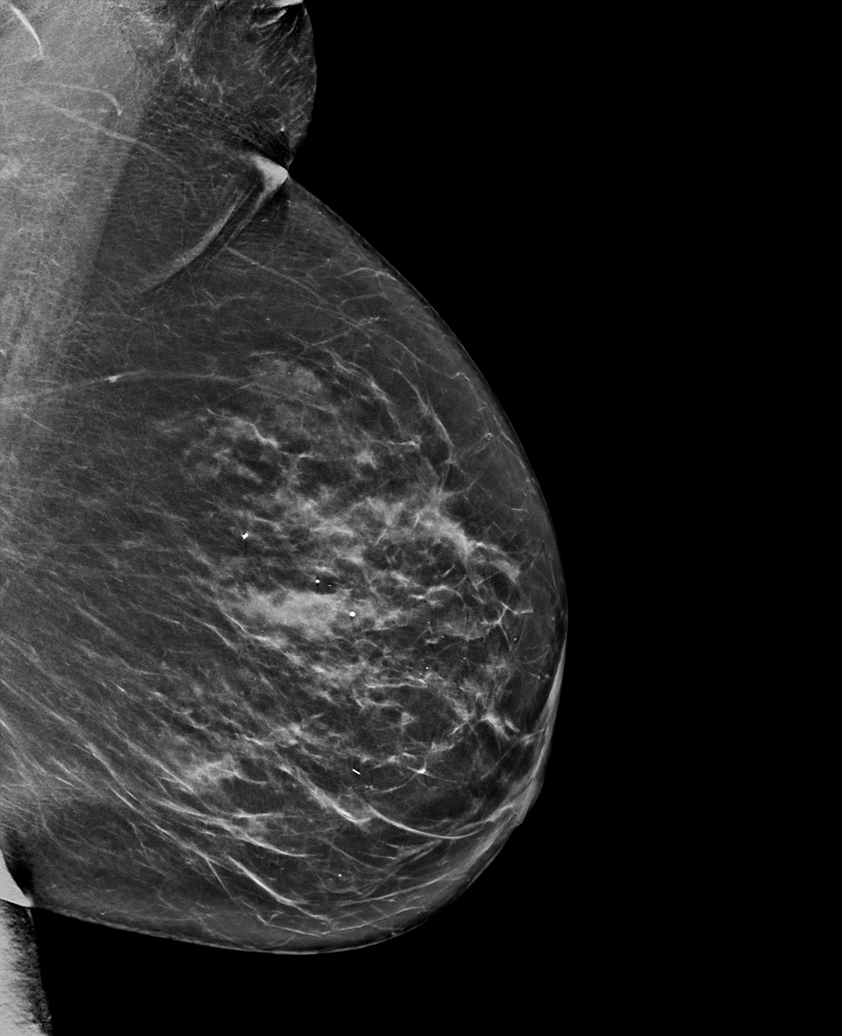

[R MLO synth-2D (2 of 2)]
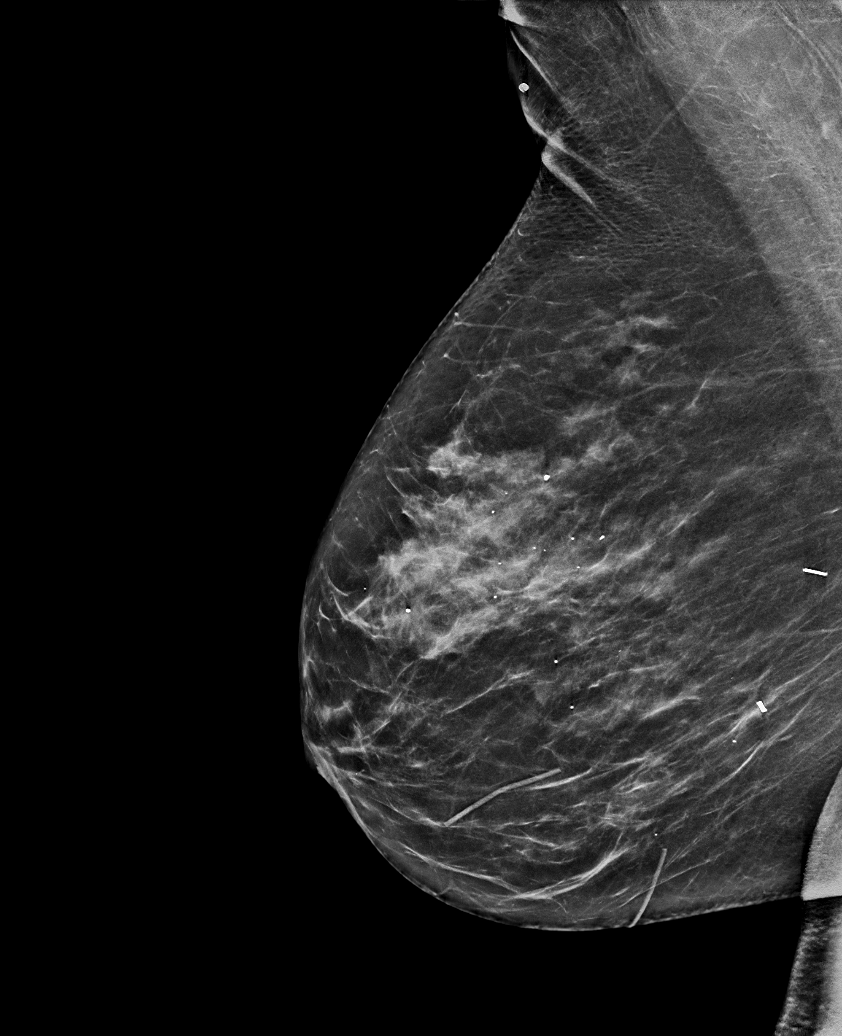

[L CC synth-2D]
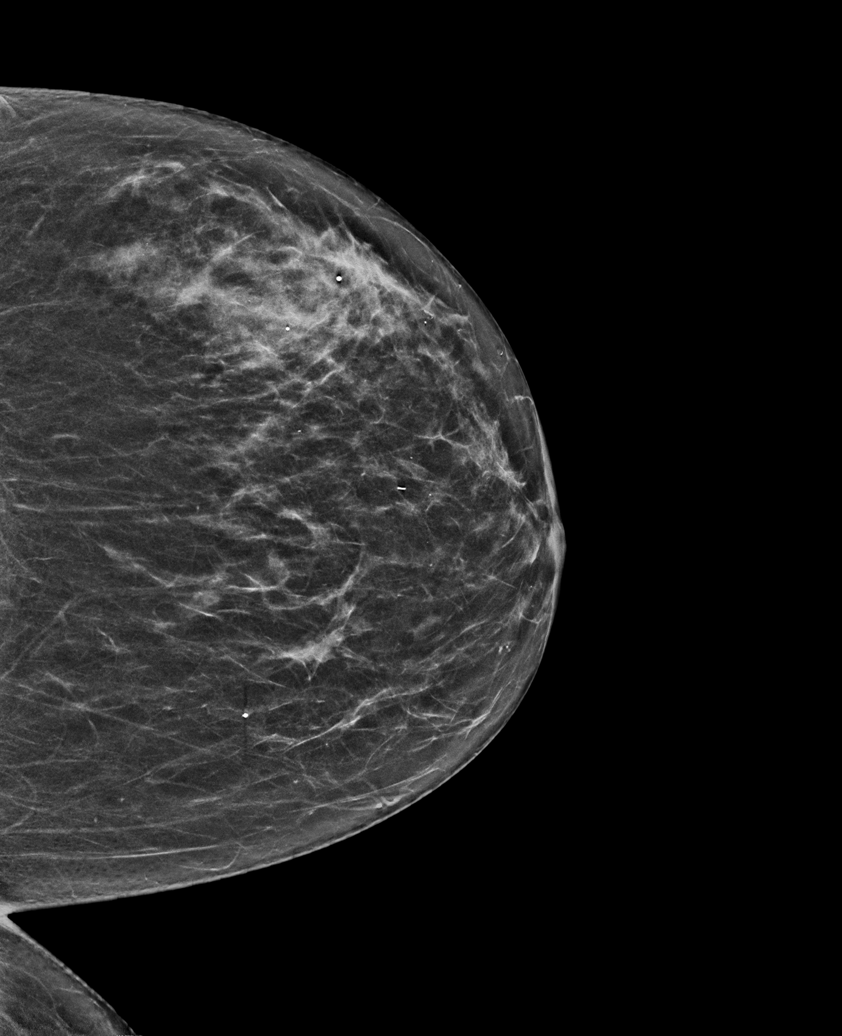

[R CC synth-2D (1 of 2)]
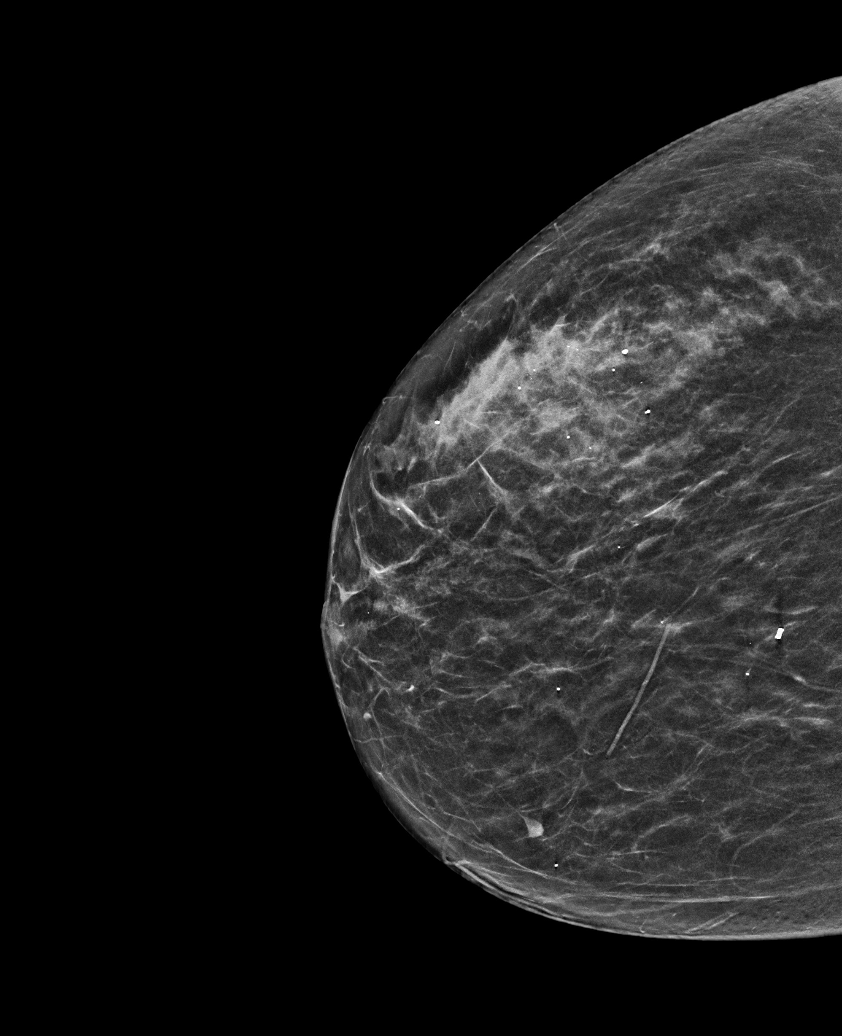

[R CC synth-2D (2 of 2)]
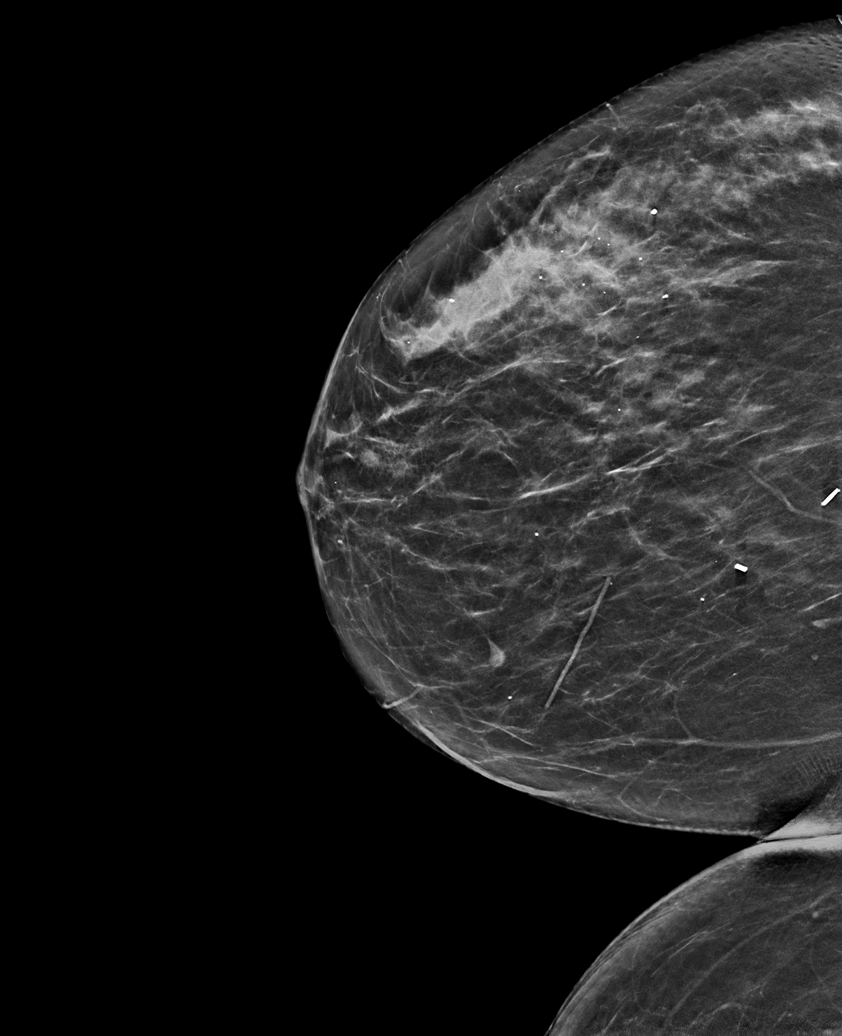

[6 of 36 positions shown; findings below may reference images not displayed]

ACR Breast Density Category c: The breast tissue is heterogeneously
dense, which may obscure small masses.
FINDINGS: 2D/3D full field views of both breasts and a spot compression view
of the RIGHT axilla demonstrate no suspicious mass, distortion or
worrisome calcifications.

On physical exam, no palpable abnormalities are identified within
the RIGHT axilla or OUTER RIGHT breast.

Targeted ultrasound is performed, showing no sonographic
abnormalities are identified within the RIGHT axilla or OUTER RIGHT
breast.
IMPRESSION: 1. No mammographic, palpable or sonographic abnormality within the
RIGHT axilla are OUTER RIGHT breast, in the areas of patient's pain.
2. No mammographic evidence of breast malignancy.

RECOMMENDATION:
Bilateral screening mammogram in 1 year.

I have discussed the findings and recommendations with the patient.
If applicable, a reminder letter will be sent to the patient
regarding the next appointment.

BI-RADS CATEGORY  1: Negative.

## 2022-03-20 ENCOUNTER — Other Ambulatory Visit (INDEPENDENT_AMBULATORY_CARE_PROVIDER_SITE_OTHER): Payer: Self-pay | Admitting: Vascular Surgery

## 2022-03-20 DIAGNOSIS — I872 Venous insufficiency (chronic) (peripheral): Secondary | ICD-10-CM

## 2022-03-22 ENCOUNTER — Ambulatory Visit (INDEPENDENT_AMBULATORY_CARE_PROVIDER_SITE_OTHER): Payer: Medicare PPO | Admitting: Vascular Surgery

## 2022-03-22 ENCOUNTER — Encounter (INDEPENDENT_AMBULATORY_CARE_PROVIDER_SITE_OTHER): Payer: Medicare PPO

## 2022-03-28 ENCOUNTER — Other Ambulatory Visit: Payer: Self-pay | Admitting: Family Medicine

## 2022-03-28 ENCOUNTER — Ambulatory Visit: Admission: RE | Admit: 2022-03-28 | Payer: Medicare PPO | Source: Ambulatory Visit

## 2022-03-28 DIAGNOSIS — N6310 Unspecified lump in the right breast, unspecified quadrant: Secondary | ICD-10-CM

## 2022-03-30 ENCOUNTER — Ambulatory Visit: Payer: Medicare PPO | Admitting: Family Medicine

## 2022-03-30 ENCOUNTER — Encounter: Payer: Self-pay | Admitting: Family Medicine

## 2022-03-30 VITALS — BP 134/100 | HR 74 | Ht 68.0 in | Wt 165.0 lb

## 2022-03-30 DIAGNOSIS — M940 Chondrocostal junction syndrome [Tietze]: Secondary | ICD-10-CM | POA: Diagnosis not present

## 2022-03-30 DIAGNOSIS — L739 Follicular disorder, unspecified: Secondary | ICD-10-CM

## 2022-03-30 DIAGNOSIS — R030 Elevated blood-pressure reading, without diagnosis of hypertension: Secondary | ICD-10-CM | POA: Diagnosis not present

## 2022-03-30 DIAGNOSIS — R922 Inconclusive mammogram: Secondary | ICD-10-CM | POA: Diagnosis not present

## 2022-03-30 DIAGNOSIS — Z1231 Encounter for screening mammogram for malignant neoplasm of breast: Secondary | ICD-10-CM | POA: Diagnosis not present

## 2022-03-30 NOTE — Progress Notes (Signed)
Date:  03/30/2022   Name:  Bianca Hawkins   DOB:  01-27-1946   MRN:  [id number]   Chief Complaint: breast exam (Pain under R) breast- felt it last week)  Patient is a 76 year old female who presents for a bilateral breast  exam. The patient reports the following problems: bug bite/?palpable concern. Health maintenance has been reviewed mammoram scheduled.     Lab Results  Component Value Date   NA 140 08/04/2021   K 4.5 08/04/2021   CO2 32 10/15/2016   GLUCOSE 93 10/15/2016   BUN 15 08/04/2021   CREATININE 0.7 08/04/2021   CALCIUM 10.2 08/04/2021   GFRNONAA >60 10/15/2016   Lab Results  Component Value Date   CHOL 148 08/04/2021   HDL 60 08/04/2021   LDLCALC 66 08/04/2021   TRIG 106 08/04/2021   CHOLHDL 2.5 09/25/2016   Lab Results  Component Value Date   TSH 0.80 08/04/2021   Lab Results  Component Value Date   HGBA1C 6.8 08/04/2021   Lab Results  Component Value Date   WBC 7.8 09/01/2019   HGB 12.2 09/01/2019   HCT 37.0 09/01/2019   MCV 87.3 09/01/2019   PLT 135 (L) 09/01/2019   Lab Results  Component Value Date   ALT 17 08/04/2021   AST 19 08/04/2021   ALKPHOS 50 10/15/2016   BILITOT 0.4 10/15/2016   No results found for: 25OHVITD2, 25OHVITD3, VD25OH   Review of Systems  Constitutional: Negative.  Negative for chills, fatigue, fever and unexpected weight change.  HENT:  Negative for congestion, ear discharge, ear pain, rhinorrhea, sinus pressure, sneezing and sore throat.   Respiratory:  Negative for cough, shortness of breath, wheezing and stridor.   Gastrointestinal:  Negative for abdominal pain, blood in stool, constipation, diarrhea and nausea.  Genitourinary:  Negative for dysuria, flank pain, frequency, hematuria, urgency and vaginal discharge.  Musculoskeletal:  Negative for arthralgias, back pain and myalgias.  Skin:  Negative for rash.  Neurological:  Negative for dizziness, weakness and headaches.  Hematological:  Negative for  adenopathy. Does not bruise/bleed easily.  Psychiatric/Behavioral:  Negative for dysphoric mood. The patient is not nervous/anxious.    Patient Active Problem List   Diagnosis Date Noted   Varicose veins of leg with pain 01/08/2022   Chronic venous insufficiency 01/08/2022   Nocturnal leg cramps 01/08/2022   Pure hypercholesterolemia 09/24/2018   History of biliary T-tube placement 01/22/2018   Recurrent major depressive episodes ([place]) 01/22/2018   Age-related osteoporosis without current pathological fracture 09/09/2017   Hyperlipidemia due to type 2 diabetes mellitus ([place]) 09/09/2017   Panic attacks 04/16/2017   Hyperparathyroidism, primary ([place]) 03/01/2017   Multinodular goiter 03/01/2017   Essential hypertension 06/28/2016   Chronic anxiety 06/28/2016   Type 2 diabetes mellitus without complication, without long-term current use of insulin ([place]) 08/09/2015   Familial multiple lipoprotein-type hyperlipidemia 08/09/2015   Pain in joint 08/09/2015   Encounter for general adult medical examination without abnormal findings 08/09/2015   Chronic obstructive pulmonary disease (COPD) ([place]) 08/09/2015   H/O: depression 08/09/2015   Gastroesophageal reflux disease without esophagitis 08/09/2015   Blood glucose elevated 08/09/2015   Encounter for screening for osteoporosis 08/09/2015   Need for vaccination 08/09/2015   Chronic MEE (middle ear effusion) 08/09/2015   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    Allergies  Allergen Reactions   Other Shortness Of Breath    Pepper   Eggs Or Egg-Derived Products    Sulfa Antibiotics  Past Surgical History:  Procedure Laterality Date   BIOPSY BREAST     benign   BREAST BIOPSY Right 2014   core with clips;benign per pt   COLONOSCOPY  2008   cleared for 10 years/ occults- 07/22/2015- negative   INNER EAR SURGERY     benign tumor behind eardrum   PARATHYROID EXPLORATION     nodule removed   THYROID EXPLORATION  04/2017   nodule  removed    Social History   Tobacco Use   Smoking status: Former    Packs/day: 0.50    Years: 30.00    Pack years: 15.00    Types: Cigarettes    Quit date: 2003    Years since quitting: 20.3   Smokeless tobacco: Never  Vaping Use   Vaping Use: Never used  Substance Use Topics   Alcohol use: No    Alcohol/week: 0.0 standard drinks   Drug use: No     Medication list has been reviewed and updated.  Current Meds  Medication Sig   ACCU-CHEK AVIVA PLUS test strip USE AS DIRECTED   aspirin 81 MG tablet Take 1 tablet by mouth daily.   cetirizine (ZYRTEC) 5 MG tablet Take 1 tablet by mouth as needed.   Cholecalciferol 25 MCG (1000 UT) tablet Take 1,000 mg by mouth daily.   fluticasone (FLONASE) 50 MCG/ACT nasal spray Place 1 spray into both nostrils as needed.   glipiZIDE (GLUCOTROL XL) 5 MG 24 hr tablet Take 5 mg by mouth daily with breakfast. Dr Honor Junes   linagliptin (TRADJENTA) 5 MG TABS tablet Take 5 mg by mouth daily. Dr Honor Junes   metFORMIN (GLUCOPHAGE) 1000 MG tablet Take 1 tablet (1,000 mg total) by mouth 2 (two) times daily. (Patient taking differently: Take 1,000 mg by mouth 2 (two) times daily. Dr Honor Junes)   metoprolol tartrate (LOPRESSOR) 100 MG tablet Take 1 tablet (100 mg total) by mouth 2 (two) times daily.   omeprazole (PRILOSEC) 20 MG capsule Take 1 capsule (20 mg total) by mouth daily.   PARoxetine (PAXIL) 10 MG tablet Take 1 tablet (10 mg total) by mouth daily.   ramipril (ALTACE) 10 MG capsule Take 1 capsule (10 mg total) by mouth daily.   simvastatin (ZOCOR) 20 MG tablet Take 1 tablet (20 mg total) by mouth daily.       03/30/2022   11:00 AM 12/08/2021   10:50 AM 06/12/2021    1:40 PM 07/22/2020    9:30 AM  GAD 7 : Generalized Anxiety Score  Nervous, Anxious, on Edge 1 0 0 0  Control/stop worrying 2 0 0 0  Worry too much - different things 0 0 0 0  Trouble relaxing 0 0 0 0  Restless 0 0 0 0  Easily annoyed or irritable 0 0 0 0  Afraid - awful might  happen 0 0 0 0  Total GAD 7 Score 3 0 0 0  Anxiety Difficulty Not difficult at all Not difficult at all Not difficult at all        03/30/2022   10:59 AM  Depression screen PHQ 2/9  Decreased Interest 0  Down, Depressed, Hopeless 0  PHQ - 2 Score 0  Altered sleeping 0  Tired, decreased energy 0  Change in appetite 1  Feeling bad or failure about yourself  1  Trouble concentrating 0  Moving slowly or fidgety/restless 0  Suicidal thoughts 0  PHQ-9 Score 2  Difficult doing work/chores Not difficult at all    BP  Readings from Last 3 Encounters:  03/30/22 (!) 130/100  01/08/22 (!) 177/82  12/08/21 130/84    Physical Exam Vitals and nursing note reviewed.  Constitutional:      Appearance: She is well-developed.  HENT:     Head: Normocephalic.     Right Ear: Tympanic membrane and external ear normal.     Left Ear: Tympanic membrane and external ear normal.     Nose: Congestion present.  Eyes:     General: Lids are everted, no foreign bodies appreciated. No scleral icterus.       Left eye: No foreign body or hordeolum.     Conjunctiva/sclera: Conjunctivae normal.     Right eye: Right conjunctiva is not injected.     Left eye: Left conjunctiva is not injected.     Pupils: Pupils are equal, round, and reactive to light.  Neck:     Thyroid: No thyromegaly.     Vascular: No JVD.     Trachea: No tracheal deviation.  Cardiovascular:     Rate and Rhythm: Normal rate and regular rhythm.     Heart sounds: Normal heart sounds. No murmur heard.   No friction rub. No gallop.  Pulmonary:     Effort: Pulmonary effort is normal. No respiratory distress.     Breath sounds: Normal breath sounds. No wheezing or rales.  Chest:     Chest wall: Tenderness present.  Breasts:    Right: No swelling, bleeding, inverted nipple, mass, nipple discharge, skin change or tenderness.     Left: No swelling, bleeding, inverted nipple, mass, nipple discharge, skin change or tenderness.        Comments: 8:00 mid ways left breast is a postinflammatory insect bite or folliculitis which is not involving the dermis and is very superficial and should not affect the mammogram.  There is no palpable adenopathy. Abdominal:     General: Bowel sounds are normal.     Palpations: Abdomen is soft. There is no mass.     Tenderness: There is no abdominal tenderness. There is no guarding or rebound.  Musculoskeletal:        General: No tenderness.     Cervical back: Normal range of motion and neck supple.  Lymphadenopathy:     Cervical: No cervical adenopathy.     Upper Body:     Right upper body: No supraclavicular or axillary adenopathy.     Left upper body: No supraclavicular or axillary adenopathy.  Skin:    Findings: No rash.  Neurological:     Mental Status: She is alert.     Cranial Nerves: No cranial nerve deficit.     Deep Tendon Reflexes: Reflexes normal.  Psychiatric:        Mood and Affect: Mood is not anxious or depressed.    Wt Readings from Last 3 Encounters:  03/30/22 165 lb (74.8 kg)  01/08/22 169 lb 6.4 oz (76.8 kg)  12/08/21 166 lb (75.3 kg)    BP (!) 130/100   Pulse 74   Ht '5\' 8"'$  (1.727 m)   Wt 165 lb (74.8 kg)   BMI 25.09 kg/m   Assessment and Plan:  1. Breast cancer screening by mammogram Patient presents for breast exam since she told [facility] that she had a bug bite on one of her breast and thought she could feel something deep in the center of her chest near her sternum and she thought it might be breast tissue.  Dam was done today and  it was noted to be normal with no palpable mass or abnormality.  2. Costochondritis New onset.  The area of concern is tender and involves the costochondral area of the 10th rib.  This is likely costochondritis and patient has been suggested to use over-the-counter medications for control but the choice should be Tylenol so that there is no inflammatory that may raise blood pressure.  3. Dense  breasts Bilateral breast exam notes only breast tissue that is more dense given the patient's age than usual.  There is no palpable masses and we will proceed with mammogram of a diagnostic nature.  4. Elevated blood pressure reading Chronic.  Episodic.  Today is elevated because the patient has been rushing.  There is no abnormality noted on cardiac or pulmonary exam and review of medications that we will likely continue on current dosing since most recent blood pressure reading at endocrinology was in normal range.  We will recheck patient's blood pressure in 2 weeks and if still elevated we will make an adjustment or perhaps add amlodipine at low dosing.  5. Folliculitis Small area of folliculitis likely postinflammatory of the left breast that the patient mentioned to the breast center.  This should pose no abnormality when mammograms are done and that it is very superficial.

## 2022-03-30 NOTE — Patient Instructions (Signed)

## 2022-04-17 ENCOUNTER — Ambulatory Visit: Payer: Medicare PPO | Admitting: Family Medicine

## 2022-04-17 ENCOUNTER — Encounter: Payer: Self-pay | Admitting: Family Medicine

## 2022-04-17 VITALS — BP 124/82 | HR 68 | Ht 68.0 in | Wt 165.0 lb

## 2022-04-17 DIAGNOSIS — I10 Essential (primary) hypertension: Secondary | ICD-10-CM

## 2022-04-17 MED ORDER — RAMIPRIL 10 MG PO CAPS: 10.0000 mg | ORAL_CAPSULE | Freq: Every day | ORAL | 1 refills | Status: DC

## 2022-04-17 MED ORDER — METOPROLOL TARTRATE 100 MG PO TABS: 100.0000 mg | ORAL_TABLET | Freq: Two times a day (BID) | ORAL | 1 refills | Status: DC

## 2022-04-17 NOTE — Progress Notes (Signed)
Date:  04/17/2022   Name:  Bianca Hawkins   DOB:  [birth year]   MRN:  [id number]   Chief Complaint: Hypertension (Follow up)  Hypertension This is a chronic problem. The current episode started more than 1 year ago. The problem has been gradually improving since onset. The problem is controlled. Pertinent negatives include no anxiety, blurred vision, chest pain, headaches, malaise/fatigue, neck pain, orthopnea, palpitations, peripheral edema, PND, shortness of breath or sweats. There are no associated agents to hypertension. There are no known risk factors for coronary artery disease. Past treatments include ACE inhibitors and beta blockers. The current treatment provides moderate improvement. There are no compliance problems.  There is no history of angina, kidney disease, CAD/MI, CVA, heart failure, left ventricular hypertrophy, PVD or retinopathy. There is no history of chronic renal disease, a hypertension causing med or renovascular disease.   Lab Results  Component Value Date   NA 140 08/04/2021   K 4.5 08/04/2021   CO2 32 10/15/2016   GLUCOSE 93 10/15/2016   BUN 15 08/04/2021   CREATININE 0.7 08/04/2021   CALCIUM 10.2 08/04/2021   GFRNONAA >60 10/15/2016   Lab Results  Component Value Date   CHOL 148 08/04/2021   HDL 60 08/04/2021   LDLCALC 66 08/04/2021   TRIG 106 08/04/2021   CHOLHDL 2.5 09/25/2016   Lab Results  Component Value Date   TSH 0.80 08/04/2021   Lab Results  Component Value Date   HGBA1C 6.8 08/04/2021   Lab Results  Component Value Date   WBC 7.8 09/01/2019   HGB 12.2 09/01/2019   HCT 37.0 09/01/2019   MCV 87.3 09/01/2019   PLT 135 (L) 09/01/2019   Lab Results  Component Value Date   ALT 17 08/04/2021   AST 19 08/04/2021   ALKPHOS 50 10/15/2016   BILITOT 0.4 10/15/2016   No results found for: 25OHVITD2, 25OHVITD3, VD25OH   Review of Systems  Constitutional:  Negative for chills, fever and malaise/fatigue.  HENT:  Negative for  drooling, ear discharge, ear pain and sore throat.   Eyes:  Negative for blurred vision.  Respiratory:  Negative for cough, shortness of breath and wheezing.   Cardiovascular:  Negative for chest pain, palpitations, orthopnea, leg swelling and PND.  Gastrointestinal:  Negative for abdominal pain, blood in stool, constipation, diarrhea and nausea.  Endocrine: Negative for polydipsia.  Genitourinary:  Negative for dysuria, frequency, hematuria and urgency.  Musculoskeletal:  Negative for back pain, myalgias and neck pain.  Skin:  Negative for rash.  Allergic/Immunologic: Negative for environmental allergies.  Neurological:  Negative for dizziness and headaches.  Hematological:  Does not bruise/bleed easily.  Psychiatric/Behavioral:  Negative for suicidal ideas. The patient is not nervous/anxious.    Patient Active Problem List   Diagnosis Date Noted   Varicose veins of leg with pain 01/08/2022   Chronic venous insufficiency 01/08/2022   Nocturnal leg cramps 01/08/2022   Pure hypercholesterolemia 09/24/2018   History of biliary T-tube placement 01/22/2018   Recurrent major depressive episodes ([place]) 01/22/2018   Age-related osteoporosis without current pathological fracture 09/09/2017   Hyperlipidemia due to type 2 diabetes mellitus ([place]) 09/09/2017   Panic attacks 04/16/2017   Hyperparathyroidism, primary ([place]) 03/01/2017   Multinodular goiter 03/01/2017   Essential hypertension 06/28/2016   Chronic anxiety 06/28/2016   Type 2 diabetes mellitus without complication, without long-term current use of insulin ([place]) 08/09/2015   Familial multiple lipoprotein-type hyperlipidemia 08/09/2015   Pain in joint 08/09/2015   Encounter  for general adult medical examination without abnormal findings 08/09/2015   Chronic obstructive pulmonary disease (COPD) ([place]) 08/09/2015   H/O: depression 08/09/2015   Gastroesophageal reflux disease without esophagitis 08/09/2015   Blood glucose elevated  08/09/2015   Encounter for screening for osteoporosis 08/09/2015   Need for vaccination 08/09/2015   Chronic MEE (middle ear effusion) 08/09/2015   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    Allergies  Allergen Reactions   Other Shortness Of Breath    Pepper   Eggs Or Egg-Derived Products    Sulfa Antibiotics     Past Surgical History:  Procedure Laterality Date   BIOPSY BREAST     benign   BREAST BIOPSY Right 2014   core with clips;benign per pt   COLONOSCOPY  2008   cleared for 10 years/ occults- 07/22/2015- negative   INNER EAR SURGERY     benign tumor behind eardrum   PARATHYROID EXPLORATION     nodule removed   THYROID EXPLORATION  04/2017   nodule removed    Social History   Tobacco Use   Smoking status: Former    Packs/day: 0.50    Years: 30.00    Pack years: 15.00    Types: Cigarettes    Quit date: 2003    Years since quitting: 20.4   Smokeless tobacco: Never  Vaping Use   Vaping Use: Never used  Substance Use Topics   Alcohol use: No    Alcohol/week: 0.0 standard drinks   Drug use: No     Medication list has been reviewed and updated.  Current Meds  Medication Sig   ACCU-CHEK AVIVA PLUS test strip USE AS DIRECTED   aspirin 81 MG tablet Take 1 tablet by mouth daily.   cetirizine (ZYRTEC) 5 MG tablet Take 1 tablet by mouth as needed.   Cholecalciferol 25 MCG (1000 UT) tablet Take 1,000 mg by mouth daily.   fluticasone (FLONASE) 50 MCG/ACT nasal spray Place 1 spray into both nostrils as needed.   glipiZIDE (GLUCOTROL XL) 5 MG 24 hr tablet Take 5 mg by mouth daily with breakfast. Dr Honor Junes   linagliptin (TRADJENTA) 5 MG TABS tablet Take 5 mg by mouth daily. Dr Honor Junes   metFORMIN (GLUCOPHAGE) 1000 MG tablet Take 1 tablet (1,000 mg total) by mouth 2 (two) times daily. (Patient taking differently: Take 1,000 mg by mouth 2 (two) times daily. Dr Honor Junes)   metoprolol tartrate (LOPRESSOR) 100 MG tablet Take 1 tablet (100 mg total) by mouth 2  (two) times daily.   omeprazole (PRILOSEC) 20 MG capsule Take 1 capsule (20 mg total) by mouth daily.   PARoxetine (PAXIL) 10 MG tablet Take 1 tablet (10 mg total) by mouth daily.   ramipril (ALTACE) 10 MG capsule Take 1 capsule (10 mg total) by mouth daily.   simvastatin (ZOCOR) 20 MG tablet Take 1 tablet (20 mg total) by mouth daily.       03/30/2022   11:00 AM 12/08/2021   10:50 AM 06/12/2021    1:40 PM 07/22/2020    9:30 AM  GAD 7 : Generalized Anxiety Score  Nervous, Anxious, on Edge 1 0 0 0  Control/stop worrying 2 0 0 0  Worry too much - different things 0 0 0 0  Trouble relaxing 0 0 0 0  Restless 0 0 0 0  Easily annoyed or irritable 0 0 0 0  Afraid - awful might happen 0 0 0 0  Total GAD 7 Score 3 0 0 0  Anxiety  Difficulty Not difficult at all Not difficult at all Not difficult at all        03/30/2022   10:59 AM  Depression screen PHQ 2/9  Decreased Interest 0  Down, Depressed, Hopeless 0  PHQ - 2 Score 0  Altered sleeping 0  Tired, decreased energy 0  Change in appetite 1  Feeling bad or failure about yourself  1  Trouble concentrating 0  Moving slowly or fidgety/restless 0  Suicidal thoughts 0  PHQ-9 Score 2  Difficult doing work/chores Not difficult at all    BP Readings from Last 3 Encounters:  04/17/22 124/82  03/30/22 (!) 134/100  01/08/22 (!) 177/82    Physical Exam Vitals and nursing note reviewed.  Constitutional:      Appearance: She is well-developed.  HENT:     Head: Normocephalic.     Right Ear: External ear normal.     Left Ear: External ear normal.  Eyes:     General: Lids are everted, no foreign bodies appreciated. No scleral icterus.       Left eye: No foreign body or hordeolum.     Conjunctiva/sclera: Conjunctivae normal.     Right eye: Right conjunctiva is not injected.     Left eye: Left conjunctiva is not injected.     Pupils: Pupils are equal, round, and reactive to light.  Neck:     Thyroid: No thyromegaly.     Vascular: No  JVD.     Trachea: No tracheal deviation.  Cardiovascular:     Rate and Rhythm: Normal rate and regular rhythm.     Heart sounds: Normal heart sounds, S1 normal and S2 normal. No murmur heard. No systolic murmur is present.  No diastolic murmur is present.    No friction rub. No gallop. No S3 or S4 sounds.  Pulmonary:     Effort: Pulmonary effort is normal. No respiratory distress.     Breath sounds: Normal breath sounds. No wheezing or rales.  Abdominal:     General: Bowel sounds are normal.     Palpations: Abdomen is soft. There is no mass.     Tenderness: There is no abdominal tenderness. There is no guarding or rebound.  Musculoskeletal:        General: No tenderness. Normal range of motion.     Cervical back: Normal range of motion and neck supple.  Skin:    General: Skin is warm.  Neurological:     Mental Status: She is alert.     Deep Tendon Reflexes: Reflexes normal.  Psychiatric:        Mood and Affect: Mood is not anxious or depressed.    Wt Readings from Last 3 Encounters:  04/17/22 165 lb (74.8 kg)  03/30/22 165 lb (74.8 kg)  01/08/22 169 lb 6.4 oz (76.8 kg)    BP 124/82   Pulse 68   Ht '5\' 8"'$  (1.727 m)   Wt 165 lb (74.8 kg)   BMI 25.09 kg/m   Assessment and Plan:  1. Essential hypertension Chronic.  Controlled.  Stable.  Blood pressure today is 124/82.  Continue metoprolol 100 mg twice a day and ramipril 10 mg once a day.  We will hold on labs today and do it in 6 months when we recheck her. - metoprolol tartrate (LOPRESSOR) 100 MG tablet; Take 1 tablet (100 mg total) by mouth 2 (two) times daily.  Dispense: 180 tablet; Refill: 1 - ramipril (ALTACE) 10 MG capsule; Take 1 capsule (10 mg  total) by mouth daily.  Dispense: 90 capsule; Refill: 1

## 2022-04-19 ENCOUNTER — Ambulatory Visit (INDEPENDENT_AMBULATORY_CARE_PROVIDER_SITE_OTHER): Payer: Medicare PPO

## 2022-04-19 ENCOUNTER — Encounter (INDEPENDENT_AMBULATORY_CARE_PROVIDER_SITE_OTHER): Payer: Self-pay | Admitting: Vascular Surgery

## 2022-04-19 ENCOUNTER — Ambulatory Visit (INDEPENDENT_AMBULATORY_CARE_PROVIDER_SITE_OTHER): Payer: Medicare PPO | Admitting: Vascular Surgery

## 2022-04-19 VITALS — BP 180/92 | HR 73 | Resp 17 | Ht 68.0 in | Wt 167.0 lb

## 2022-04-19 DIAGNOSIS — I83813 Varicose veins of bilateral lower extremities with pain: Secondary | ICD-10-CM | POA: Diagnosis not present

## 2022-04-19 DIAGNOSIS — I872 Venous insufficiency (chronic) (peripheral): Secondary | ICD-10-CM

## 2022-04-19 DIAGNOSIS — I10 Essential (primary) hypertension: Secondary | ICD-10-CM

## 2022-04-19 DIAGNOSIS — K219 Gastro-esophageal reflux disease without esophagitis: Secondary | ICD-10-CM

## 2022-04-19 DIAGNOSIS — E119 Type 2 diabetes mellitus without complications: Secondary | ICD-10-CM | POA: Diagnosis not present

## 2022-04-20 ENCOUNTER — Ambulatory Visit
Admission: RE | Admit: 2022-04-20 | Discharge: 2022-04-20 | Disposition: A | Discharge status: Home / Self Care | Payer: Medicare PPO | Source: Ambulatory Visit | Attending: Family Medicine | Admitting: Family Medicine

## 2022-04-20 ENCOUNTER — Other Ambulatory Visit: Payer: Self-pay

## 2022-04-20 DIAGNOSIS — L819 Disorder of pigmentation, unspecified: Secondary | ICD-10-CM

## 2022-04-20 DIAGNOSIS — Z1231 Encounter for screening mammogram for malignant neoplasm of breast: Secondary | ICD-10-CM | POA: Diagnosis not present

## 2022-04-20 DIAGNOSIS — N6310 Unspecified lump in the right breast, unspecified quadrant: Secondary | ICD-10-CM | POA: Diagnosis not present

## 2022-04-20 DIAGNOSIS — L988 Other specified disorders of the skin and subcutaneous tissue: Secondary | ICD-10-CM | POA: Diagnosis not present

## 2022-04-20 DIAGNOSIS — R922 Inconclusive mammogram: Secondary | ICD-10-CM | POA: Diagnosis not present

## 2022-04-20 NOTE — Progress Notes (Signed)
Placed ref to derm

## 2022-04-21 ENCOUNTER — Encounter (INDEPENDENT_AMBULATORY_CARE_PROVIDER_SITE_OTHER): Payer: Self-pay | Admitting: Vascular Surgery

## 2022-04-21 NOTE — Progress Notes (Addendum)
MRN : [id number]  Bianca Hawkins is a 76 y.o. (03-04-1946) female who presents with chief complaint of varicose veins hurt.  History of Present Illness:   The patient returns for followup evaluation after the initial visit. The patient continues to have pain in the lower extremities with dependency. The pain is lessened with elevation. Graduated compression stockings, Class I (20-30 mmHg), have been worn but the stockings do not eliminate the leg pain. Over-the-counter analgesics do not improve the symptoms. The degree of discomfort continues to interfere with daily activities. The patient notes the pain in the legs is causing problems with daily exercise, at the workplace and even with household activities and maintenance such as standing in the kitchen preparing meals and doing dishes.   In particular there is a cluster of veins in her lateral left calf that is particularly painful to her.  Venous ultrasound shows normal deep venous system, no evidence of acute or chronic DVT.  Superficial reflux is not present in the left saphenous veins, there is reflux in the right great saphenous vein.   Current Meds  Medication Sig   ACCU-CHEK AVIVA PLUS test strip USE AS DIRECTED   aspirin 81 MG tablet Take 1 tablet by mouth daily.   cetirizine (ZYRTEC) 5 MG tablet Take 1 tablet by mouth as needed.   Cholecalciferol 25 MCG (1000 UT) tablet Take 1,000 mg by mouth daily.   fluticasone (FLONASE) 50 MCG/ACT nasal spray Place 1 spray into both nostrils as needed.   glipiZIDE (GLUCOTROL XL) 5 MG 24 hr tablet Take 5 mg by mouth daily with breakfast. Dr Honor Junes   linagliptin (TRADJENTA) 5 MG TABS tablet Take 5 mg by mouth daily. Dr Honor Junes   metFORMIN (GLUCOPHAGE) 1000 MG tablet Take 1 tablet (1,000 mg total) by mouth 2 (two) times daily. (Patient taking differently: Take 1,000 mg by mouth 2 (two) times daily. Dr Honor Junes)   metoprolol tartrate (LOPRESSOR) 100 MG tablet Take 1 tablet (100 mg  total) by mouth 2 (two) times daily.   omeprazole (PRILOSEC) 20 MG capsule Take 1 capsule (20 mg total) by mouth daily.   PARoxetine (PAXIL) 10 MG tablet Take 1 tablet (10 mg total) by mouth daily.   PROLIA 60 MG/ML SOSY injection Inject into the skin.   ramipril (ALTACE) 10 MG capsule Take 1 capsule (10 mg total) by mouth daily.   simvastatin (ZOCOR) 20 MG tablet Take 1 tablet (20 mg total) by mouth daily.    Past Medical History:  Diagnosis Date   Allergy    Anxiety    Depression    reccurent depression to due situational disturbance   Diabetes mellitus without complication ([place])    GERD (gastroesophageal reflux disease)    Hyperlipidemia    Hypertension    Osteoporosis     Past Surgical History:  Procedure Laterality Date   BIOPSY BREAST     benign   BREAST BIOPSY Right 2014   core with clips;benign per pt   COLONOSCOPY  2008   cleared for 10 years/ occults- 07/22/2015- negative   INNER EAR SURGERY     benign tumor behind eardrum   PARATHYROID EXPLORATION     nodule removed   THYROID EXPLORATION  04/2017   nodule removed    Social History Social History   Tobacco Use   Smoking status: Former    Packs/day: 0.50    Years: 30.00    Total pack years: 15.00    Types: Cigarettes  Quit date: 2003    Years since quitting: 20.4   Smokeless tobacco: Never  Vaping Use   Vaping Use: Never used  Substance Use Topics   Alcohol use: No    Alcohol/week: 0.0 standard drinks of alcohol   Drug use: No    Family History Family History  Problem Relation Age of Onset   Ulcerative colitis Mother    Heart attack Father    Heart disease Sister    Birth defects Sister    Healthy Brother    Stroke Daughter    Healthy Son    Breast cancer Neg Hx     Allergies  Allergen Reactions   Guggulipid-Black Pepper Anaphylaxis and Diarrhea   Other Shortness Of Breath    Pepper   Eggs Or Egg-Derived Products    Sulfa Antibiotics      REVIEW OF SYSTEMS (Negative unless  checked)  Constitutional: '[]'$ Weight loss  '[]'$ Fever  '[]'$ Chills Cardiac: '[]'$ Chest pain   '[]'$ Chest pressure   '[]'$ Palpitations   '[]'$ Shortness of breath when laying flat   '[]'$ Shortness of breath with exertion. Vascular:  '[]'$ Pain in legs with walking   '[x]'$ Pain in legs with standing  '[]'$ History of DVT   '[]'$ Phlebitis   '[]'$ Swelling in legs   '[x]'$ Varicose veins   '[]'$ Non-healing ulcers Pulmonary:   '[]'$ Uses home oxygen   '[]'$ Productive cough   '[]'$ Hemoptysis   '[]'$ Wheeze  '[]'$ COPD   '[]'$ Asthma Neurologic:  '[]'$ Dizziness   '[]'$ Seizures   '[]'$ History of stroke   '[]'$ History of TIA  '[]'$ Aphasia   '[]'$ Vissual changes   '[]'$ Weakness or numbness in arm   '[]'$ Weakness or numbness in leg Musculoskeletal:   '[]'$ Joint swelling   '[]'$ Joint pain   '[]'$ Low back pain Hematologic:  '[]'$ Easy bruising  '[]'$ Easy bleeding   '[]'$ Hypercoagulable state   '[]'$ Anemic Gastrointestinal:  '[]'$ Diarrhea   '[]'$ Vomiting  '[x]'$ Gastroesophageal reflux/heartburn   '[]'$ Difficulty swallowing. Genitourinary:  '[]'$ Chronic kidney disease   '[]'$ Difficult urination  '[]'$ Frequent urination   '[]'$ Blood in urine Skin:  '[]'$ Rashes   '[]'$ Ulcers  Psychological:  '[]'$ History of anxiety   '[]'$  History of major depression.  Physical Examination  Vitals:   04/19/22 1132  BP: (!) 180/92  Pulse: 73  Resp: 17  Weight: 167 lb (75.8 kg)  Height: '5\' 8"'$  (1.727 m)   Body mass index is 25.39 kg/m. Gen: WD/WN, NAD Head: [place]/AT, No temporalis wasting.  Ear/Nose/Throat: Hearing grossly intact, nares w/o erythema or drainage, pinna without lesions Eyes: PER, EOMI, sclera nonicteric.  Neck: Supple, no gross masses.  No JVD.  Pulmonary:  Good air movement, no audible wheezing, no use of accessory muscles.  Cardiac: RRR, precordium not hyperdynamic. Vascular:  Dense cluster of  varicosities present in the lateral left calf.  Veins are tender to palpation  Moderate venous stasis changes to the legs bilaterally.  Trace soft pitting edema  Vessel Right Left  Radial Palpable Palpable  Gastrointestinal: soft, non-distended. No guarding/no  peritoneal signs.  Musculoskeletal: M/S 5/5 throughout.  No deformity.  Neurologic: CN 2-12 intact. Pain and light touch intact in extremities.  Symmetrical.  Speech is fluent. Motor exam as listed above. Psychiatric: Judgment intact, Mood & affect appropriate for pt's clinical situation. Dermatologic: Venous rashes no ulcers noted.  No changes consistent with cellulitis. Lymph : No lichenification or skin changes of chronic lymphedema.  CBC Lab Results  Component Value Date   WBC 7.8 09/01/2019   HGB 12.2 09/01/2019   HCT 37.0 09/01/2019   MCV 87.3 09/01/2019   PLT 135 (L) 09/01/2019    BMET  Component Value Date/Time   NA 140 08/04/2021 0000   K 4.5 08/04/2021 0000   CL 101 10/15/2016 1837   CO2 32 10/15/2016 1837   GLUCOSE 93 10/15/2016 1837   BUN 15 08/04/2021 0000   CREATININE 0.7 08/04/2021 0000   CREATININE 0.72 10/15/2016 1837   CALCIUM 10.2 08/04/2021 0000   GFRNONAA >60 10/15/2016 1837   GFRAA >60 10/15/2016 1837   CrCl cannot be calculated (Patient's most recent lab result is older than the maximum 21 days allowed.).  COAG No results found for: "INR", "PROTIME"  Radiology MM DIAG BREAST TOMO BILATERAL  Result Date: 04/20/2022 CLINICAL DATA:  76 year old female presenting for annual exam. Patient has a tiny focus of discoloration on the left breast, previously was raised but patient reports is now flat. She has a few other similar scattered lesions on her body. Patient has not yet seen a dermatologist. EXAM: DIGITAL DIAGNOSTIC BILATERAL MAMMOGRAM WITH TOMOSYNTHESIS AND CAD TECHNIQUE: Bilateral digital diagnostic mammography and breast tomosynthesis was performed. The images were evaluated with computer-aided detection. COMPARISON:  Previous exam(s). ACR Breast Density Category c: The breast tissue is heterogeneously dense, which may obscure small masses. FINDINGS: Right breast: No suspicious mass, distortion, or microcalcifications are identified to suggest presence  of malignancy. Left breast: No suspicious mass, distortion, or microcalcifications are identified to suggest presence of malignancy. Specifically there is no new finding in the lower inner left breast. On physical exam of the left inner breast there is a small flat erythematous lesion without an associated mass. IMPRESSION: 1.  Tiny focus of skin discoloration on the left breast. 2.  No mammographic evidence of malignancy bilaterally. RECOMMENDATION: 1.  Dermatology consultation for the left breast. 2.  Screening mammogram in one year.(Code:SM-B-01Y) I have discussed the findings and recommendations with the patient. If applicable, a reminder letter will be sent to the patient regarding the next appointment. BI-RADS CATEGORY  2: Benign. Electronically Signed   By: Audie Pinto M.D.   On: 04/20/2022 10:59  VAS [place] LOWER EXTREMITY VENOUS REFLUX  Result Date: 04/19/2022  Lower Venous Reflux Study Patient Name:  Bianca Hawkins  Date of Exam:   04/19/2022 Medical Rec #: [id number]             Accession #:    [id number] Date of Birth: 1946/04/30            Patient Gender: F Patient Age:   31 years Exam Location:  [facility] Procedure:      VAS [place] LOWER EXTREMITY VENOUS REFLUX Referring Phys: Hortencia Pilar --------------------------------------------------------------------------------  Indications: Swelling, and Pain.  Performing Technologist: Almira Coaster RVS  Examination Guidelines: A complete evaluation includes B-mode imaging, spectral Doppler, color Doppler, and power Doppler as needed of all accessible portions of each vessel. Bilateral testing is considered an integral part of a complete examination. Limited examinations for reoccurring indications may be performed as noted. The reflux portion of the exam is performed with the patient in reverse Trendelenburg. Significant venous reflux is defined as >500 ms in the superficial venous system, and >1 second in the deep venous system.   Venous Reflux Times +--------------+---------+------+-----------+------------+--------+ RIGHT         Reflux NoRefluxReflux TimeDiameter cmsComments                         Yes                                  +--------------+---------+------+-----------+------------+--------+  CFV           no                                             +--------------+---------+------+-----------+------------+--------+ FV prox       no                                             +--------------+---------+------+-----------+------------+--------+ FV mid        no                                             +--------------+---------+------+-----------+------------+--------+ FV dist       no                                             +--------------+---------+------+-----------+------------+--------+ Popliteal     no                                             +--------------+---------+------+-----------+------------+--------+ GSV at SFJ    no                            .78              +--------------+---------+------+-----------+------------+--------+ GSV prox thighno                            .52              +--------------+---------+------+-----------+------------+--------+ GSV mid thigh no                            .35              +--------------+---------+------+-----------+------------+--------+ GSV dist thighno                            .29              +--------------+---------+------+-----------+------------+--------+ GSV at knee   no                            .40              +--------------+---------+------+-----------+------------+--------+ GSV prox calf no               2303 ms      .37              +--------------+---------+------+-----------+------------+--------+ SSV Pop Fossa no                            .22              +--------------+---------+------+-----------+------------+--------+   +--------------+---------+------+-----------+------------+--------+ LEFT  Reflux NoRefluxReflux TimeDiameter cmsComments                         Yes                                  +--------------+---------+------+-----------+------------+--------+ CFV           no                                             +--------------+---------+------+-----------+------------+--------+ FV prox       no                                             +--------------+---------+------+-----------+------------+--------+ FV mid        no                                             +--------------+---------+------+-----------+------------+--------+ FV dist       no                                             +--------------+---------+------+-----------+------------+--------+ Popliteal     no                                             +--------------+---------+------+-----------+------------+--------+ GSV at SFJ    no                            .63              +--------------+---------+------+-----------+------------+--------+ GSV prox thighno                            .39              +--------------+---------+------+-----------+------------+--------+ GSV mid thigh no                            .34              +--------------+---------+------+-----------+------------+--------+ GSV dist thighno                            .32              +--------------+---------+------+-----------+------------+--------+ GSV at knee   no                            .37              +--------------+---------+------+-----------+------------+--------+ GSV prox calf no                            .32              +--------------+---------+------+-----------+------------+--------+  SSV Pop Fossa no                            .20              +--------------+---------+------+-----------+------------+--------+   Summary: Bilateral: - No evidence of deep  vein thrombosis seen in the lower extremities, bilaterally, from the common femoral through the popliteal veins. - No evidence of superficial venous thrombosis in the lower extremities, bilaterally. - No evidence of deep venous insufficiency seen bilaterally in the lower extremity. - No evidence of superficial venous reflux seen in the short saphenous veins bilaterally.  Right: - Venous reflux is noted in the right greater saphenous vein in the calf.  Left: - No evidence of superficial venous reflux seen in the left greater saphenous vein.  *See table(s) above for measurements and observations. Electronically signed by Hortencia Pilar MD on 04/19/2022 at 5:51:58 PM.    Final      Assessment/Plan 1. Varicose veins of both lower extremities with pain Recommend:  The patient has had successful ablation of the previously incompetent saphenous venous system but still has persistent symptoms of pain and swelling that are having a negative impact on daily life and daily activities.  Patient should undergo injection sclerotherapy to treat the residual varicosities.  The risks, benefits and alternative therapies were reviewed in detail with the patient.  All questions were answered.  The patient agrees to proceed with sclerotherapy at their convenience.  The patient will continue wearing the graduated compression stockings and using the over-the-counter pain medications to treat her symptoms.       2. Chronic venous insufficiency No surgery or intervention at this point in time.    I have discussed with the patient venous insufficiency and why it  causes symptoms. I have discussed with the patient the chronic skin changes that accompany venous insufficiency and the long term sequela such as infection and ulceration.  Patient will begin wearing graduated compression stockings or compression wraps on a daily basis.  The patient will put the compression on first thing in the morning and removing them in the  evening. The patient is instructed specifically not to sleep in the compression.    In addition, behavioral modification including several periods of elevation of the lower extremities during the day will be continued. I have demonstrated that proper elevation is a position with the ankles at heart level.  The patient is instructed to begin routine exercise, especially walking on a daily basis  The patient will be assessed for a Lymph Pump depending on the effectiveness of conservative therapy and the control of the associated lymphedema.   3. Essential hypertension Continue antihypertensive medications as already ordered, these medications have been reviewed and there are no changes at this time.   4. Gastroesophageal reflux disease without esophagitis Continue PPI as already ordered, this medication has been reviewed and there are no changes at this time.  Avoidence of caffeine and alcohol  Moderate elevation of the head of the bed    5. Type 2 diabetes mellitus without complication, without long-term current use of insulin (HCC) Continue hypoglycemic medications as already ordered, these medications have been reviewed and there are no changes at this time.  Hgb A1C to be monitored as already arranged by primary service     Hortencia Pilar, MD  04/21/2022 3:00 PM

## 2022-04-25 DIAGNOSIS — D2372 Other benign neoplasm of skin of left lower limb, including hip: Secondary | ICD-10-CM | POA: Diagnosis not present

## 2022-04-25 DIAGNOSIS — D235 Other benign neoplasm of skin of trunk: Secondary | ICD-10-CM | POA: Diagnosis not present

## 2022-04-26 ENCOUNTER — Encounter: Payer: Self-pay | Admitting: Family Medicine

## 2022-04-26 ENCOUNTER — Ambulatory Visit: Payer: Medicare PPO | Admitting: Family Medicine

## 2022-04-26 VITALS — BP 138/86 | HR 97 | Ht 68.0 in | Wt 167.0 lb

## 2022-04-26 DIAGNOSIS — M25572 Pain in left ankle and joints of left foot: Secondary | ICD-10-CM | POA: Diagnosis not present

## 2022-04-26 NOTE — Progress Notes (Signed)
Date:  04/26/2022   Name:  Bianca Hawkins   DOB:  [birth year]   MRN:  [id number]   Chief Complaint: Foot Pain (L) foot- hurting across the top of foot. No place that is red or swollen in appearance)  Foot Pain Pertinent negatives include no abdominal pain, chest pain, chills, coughing, fever, headaches, myalgias, nausea, neck pain, rash or sore throat.    Lab Results  Component Value Date   NA 140 08/04/2021   K 4.5 08/04/2021   CO2 32 10/15/2016   GLUCOSE 93 10/15/2016   BUN 15 08/04/2021   CREATININE 0.7 08/04/2021   CALCIUM 10.2 08/04/2021   GFRNONAA >60 10/15/2016   Lab Results  Component Value Date   CHOL 148 08/04/2021   HDL 60 08/04/2021   LDLCALC 66 08/04/2021   TRIG 106 08/04/2021   CHOLHDL 2.5 09/25/2016   Lab Results  Component Value Date   TSH 0.80 08/04/2021   Lab Results  Component Value Date   HGBA1C 6.8 08/04/2021   Lab Results  Component Value Date   WBC 7.8 09/01/2019   HGB 12.2 09/01/2019   HCT 37.0 09/01/2019   MCV 87.3 09/01/2019   PLT 135 (L) 09/01/2019   Lab Results  Component Value Date   ALT 17 08/04/2021   AST 19 08/04/2021   ALKPHOS 50 10/15/2016   BILITOT 0.4 10/15/2016   No results found for: "25OHVITD2", "25OHVITD3", "VD25OH"   Review of Systems  Constitutional:  Negative for chills and fever.  HENT:  Negative for drooling, ear discharge, ear pain and sore throat.   Respiratory:  Negative for cough, shortness of breath and wheezing.   Cardiovascular:  Negative for chest pain, palpitations and leg swelling.  Gastrointestinal:  Negative for abdominal pain, blood in stool, constipation, diarrhea and nausea.  Endocrine: Negative for polydipsia.  Genitourinary:  Negative for dysuria, frequency, hematuria and urgency.  Musculoskeletal:  Negative for back pain, myalgias and neck pain.  Skin:  Negative for rash.  Allergic/Immunologic: Negative for environmental allergies.  Neurological:  Negative for dizziness and  headaches.  Hematological:  Does not bruise/bleed easily.  Psychiatric/Behavioral:  Negative for suicidal ideas. The patient is not nervous/anxious.     Patient Active Problem List   Diagnosis Date Noted   Varicose veins of leg with pain 01/08/2022   Chronic venous insufficiency 01/08/2022   Nocturnal leg cramps 01/08/2022   Pure hypercholesterolemia 09/24/2018   History of biliary T-tube placement 01/22/2018   Recurrent major depressive episodes ([place]) 01/22/2018   Age-related osteoporosis without current pathological fracture 09/09/2017   Hyperlipidemia due to type 2 diabetes mellitus ([place]) 09/09/2017   Panic attacks 04/16/2017   Hyperparathyroidism, primary ([place]) 03/01/2017   Multinodular goiter 03/01/2017   Essential hypertension 06/28/2016   Chronic anxiety 06/28/2016   Type 2 diabetes mellitus without complication, without long-term current use of insulin ([place]) 08/09/2015   Familial multiple lipoprotein-type hyperlipidemia 08/09/2015   Pain in joint 08/09/2015   Encounter for general adult medical examination without abnormal findings 08/09/2015   Chronic obstructive pulmonary disease (COPD) ([place]) 08/09/2015   H/O: depression 08/09/2015   Gastroesophageal reflux disease without esophagitis 08/09/2015   Blood glucose elevated 08/09/2015   Encounter for screening for osteoporosis 08/09/2015   Need for vaccination 08/09/2015   Chronic MEE (middle ear effusion) 08/09/2015   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[place]) 03/21/2015    Allergies  Allergen Reactions   Guggulipid-Black Pepper Anaphylaxis and Diarrhea   Other Shortness Of Breath    Pepper  Eggs Or Egg-Derived Products    Sulfa Antibiotics     Past Surgical History:  Procedure Laterality Date   BIOPSY BREAST     benign   BREAST BIOPSY Right 2014   core with clips;benign per pt   COLONOSCOPY  2008   cleared for 10 years/ occults- 07/22/2015- negative   INNER EAR SURGERY     benign tumor behind eardrum   PARATHYROID  EXPLORATION     nodule removed   THYROID EXPLORATION  04/2017   nodule removed    Social History   Tobacco Use   Smoking status: Former    Packs/day: 0.50    Years: 30.00    Total pack years: 15.00    Types: Cigarettes    Quit date: 2003    Years since quitting: 20.4   Smokeless tobacco: Never  Vaping Use   Vaping Use: Never used  Substance Use Topics   Alcohol use: No    Alcohol/week: 0.0 standard drinks of alcohol   Drug use: No     Medication list has been reviewed and updated.  Current Meds  Medication Sig   ACCU-CHEK AVIVA PLUS test strip USE AS DIRECTED   aspirin 81 MG tablet Take 1 tablet by mouth daily.   cetirizine (ZYRTEC) 5 MG tablet Take 1 tablet by mouth as needed.   Cholecalciferol 25 MCG (1000 UT) tablet Take 1,000 mg by mouth daily.   fluticasone (FLONASE) 50 MCG/ACT nasal spray Place 1 spray into both nostrils as needed.   glipiZIDE (GLUCOTROL XL) 5 MG 24 hr tablet Take 5 mg by mouth daily with breakfast. Dr Honor Junes   linagliptin (TRADJENTA) 5 MG TABS tablet Take 5 mg by mouth daily. Dr Honor Junes   metFORMIN (GLUCOPHAGE) 1000 MG tablet Take 1 tablet (1,000 mg total) by mouth 2 (two) times daily. (Patient taking differently: Take 1,000 mg by mouth 2 (two) times daily. Dr Honor Junes)   metoprolol tartrate (LOPRESSOR) 100 MG tablet Take 1 tablet (100 mg total) by mouth 2 (two) times daily.   omeprazole (PRILOSEC) 20 MG capsule Take 1 capsule (20 mg total) by mouth daily.   PARoxetine (PAXIL) 10 MG tablet Take 1 tablet (10 mg total) by mouth daily.   PROLIA 60 MG/ML SOSY injection Inject into the skin.   ramipril (ALTACE) 10 MG capsule Take 1 capsule (10 mg total) by mouth daily.   simvastatin (ZOCOR) 20 MG tablet Take 1 tablet (20 mg total) by mouth daily.       04/26/2022    4:09 PM 03/30/2022   11:00 AM 12/08/2021   10:50 AM 06/12/2021    1:40 PM  GAD 7 : Generalized Anxiety Score  Nervous, Anxious, on Edge 0 1 0 0  Control/stop worrying 0 2 0 0   Worry too much - different things 0 0 0 0  Trouble relaxing 0 0 0 0  Restless 0 0 0 0  Easily annoyed or irritable 0 0 0 0  Afraid - awful might happen 0 0 0 0  Total GAD 7 Score 0 3 0 0  Anxiety Difficulty Not difficult at all Not difficult at all Not difficult at all Not difficult at all       04/26/2022    4:09 PM  Depression screen PHQ 2/9  Decreased Interest 0  Down, Depressed, Hopeless 0  PHQ - 2 Score 0  Altered sleeping 0  Tired, decreased energy 0  Change in appetite 0  Feeling bad or failure about yourself  0  Trouble concentrating 0  Moving slowly or fidgety/restless 0  Suicidal thoughts 0  PHQ-9 Score 0  Difficult doing work/chores Not difficult at all    BP Readings from Last 3 Encounters:  04/26/22 (!) 160/96  04/19/22 (!) 180/92  04/17/22 124/82    Physical Exam Vitals and nursing note reviewed.  Cardiovascular:     Heart sounds: S1 normal and S2 normal.     No systolic murmur is present.     No diastolic murmur is present.     No gallop. No S3 or S4 sounds.  Pulmonary:     Breath sounds: No decreased breath sounds, wheezing, rhonchi or rales.  Musculoskeletal:     Right lower leg: No edema.     Left lower leg: No edema.     Left foot: Tenderness present.     Comments: Tenderness of 2nd/3rd metatarsal phalangeal joint     Wt Readings from Last 3 Encounters:  04/26/22 167 lb (75.8 kg)  04/19/22 167 lb (75.8 kg)  04/17/22 165 lb (74.8 kg)    BP (!) 160/96   Pulse 97   Ht '5\' 8"'$  (1.727 m)   Wt 167 lb (75.8 kg)   BMI 25.39 kg/m   Assessment and Plan:  1. Arthralgia of left foot   New onset.  Persistent.  Stable.  There is no recollection of injury and No incident of a bite of which she recently thought may have happened.  Tenderness is over the tendons and the second and third metatarsal phalangeal joints of the left foot.  Legrand Como stepped a certain way and cause some arthralgia inflammation of the metatarsophalangeal joints of the second  and third toes and/or overlying dorsiflexion tendons.  Patient is to continue ice massage as well as application of Voltaren gel and Tylenol for pain control and to continue to use left cast shoe

## 2022-04-30 ENCOUNTER — Ambulatory Visit
Admission: RE | Admit: 2022-04-30 | Discharge: 2022-04-30 | Disposition: A | Discharge status: Home / Self Care | Payer: Medicare PPO | Source: Ambulatory Visit | Attending: Family Medicine | Admitting: Family Medicine

## 2022-04-30 ENCOUNTER — Encounter: Payer: Self-pay | Admitting: Family Medicine

## 2022-04-30 ENCOUNTER — Ambulatory Visit: Payer: Medicare PPO | Admitting: Family Medicine

## 2022-04-30 ENCOUNTER — Ambulatory Visit
Admission: RE | Admit: 2022-04-30 | Discharge: 2022-04-30 | Disposition: A | Discharge status: Home / Self Care | Payer: Medicare PPO | Attending: Family Medicine | Admitting: Family Medicine

## 2022-04-30 VITALS — BP 130/88 | HR 72 | Ht 68.0 in | Wt 167.0 lb

## 2022-04-30 DIAGNOSIS — M7742 Metatarsalgia, left foot: Secondary | ICD-10-CM | POA: Diagnosis not present

## 2022-04-30 DIAGNOSIS — M7989 Other specified soft tissue disorders: Secondary | ICD-10-CM | POA: Diagnosis not present

## 2022-04-30 MED ORDER — MELOXICAM 7.5 MG PO TABS: 7.5000 mg | ORAL_TABLET | Freq: Every day | ORAL | 0 refills | Status: DC

## 2022-04-30 NOTE — Progress Notes (Signed)
Date:  04/30/2022   Name:  Bianca Hawkins   DOB:  Jan 27, 1946   MRN:  [id number]   Chief Complaint: Foot Pain (X 2 weeks- hurts to walk- feels like "a knife is sticking in it")  Foot Pain This is a new problem. The current episode started 1 to 4 weeks ago. The problem occurs constantly. The problem has been waxing and waning. Pertinent negatives include no arthralgias, joint swelling, myalgias, numbness or weakness.    Lab Results  Component Value Date   NA 140 08/04/2021   K 4.5 08/04/2021   CO2 32 10/15/2016   GLUCOSE 93 10/15/2016   BUN 15 08/04/2021   CREATININE 0.7 08/04/2021   CALCIUM 10.2 08/04/2021   GFRNONAA >60 10/15/2016   Lab Results  Component Value Date   CHOL 148 08/04/2021   HDL 60 08/04/2021   LDLCALC 66 08/04/2021   TRIG 106 08/04/2021   CHOLHDL 2.5 09/25/2016   Lab Results  Component Value Date   TSH 0.80 08/04/2021   Lab Results  Component Value Date   HGBA1C 6.8 08/04/2021   Lab Results  Component Value Date   WBC 7.8 09/01/2019   HGB 12.2 09/01/2019   HCT 37.0 09/01/2019   MCV 87.3 09/01/2019   PLT 135 (L) 09/01/2019   Lab Results  Component Value Date   ALT 17 08/04/2021   AST 19 08/04/2021   ALKPHOS 50 10/15/2016   BILITOT 0.4 10/15/2016   No results found for: "25OHVITD2", "25OHVITD3", "VD25OH"   Review of Systems  Musculoskeletal:  Negative for arthralgias, joint swelling and myalgias.  Neurological:  Negative for weakness and numbness.    Patient Active Problem List   Diagnosis Date Noted   Varicose veins of leg with pain 01/08/2022   Chronic venous insufficiency 01/08/2022   Nocturnal leg cramps 01/08/2022   Pure hypercholesterolemia 09/24/2018   History of biliary T-tube placement 01/22/2018   Recurrent major depressive episodes ([place]) 01/22/2018   Age-related osteoporosis without current pathological fracture 09/09/2017   Hyperlipidemia due to type 2 diabetes mellitus ([place]) 09/09/2017   Panic attacks  04/16/2017   Hyperparathyroidism, primary ([place]) 03/01/2017   Multinodular goiter 03/01/2017   Essential hypertension 06/28/2016   Chronic anxiety 06/28/2016   Type 2 diabetes mellitus without complication, without long-term current use of insulin ([place]) 08/09/2015   Familial multiple lipoprotein-type hyperlipidemia 08/09/2015   Pain in joint 08/09/2015   Encounter for general adult medical examination without abnormal findings 08/09/2015   Chronic obstructive pulmonary disease (COPD) ([place]) 08/09/2015   H/O: depression 08/09/2015   Gastroesophageal reflux disease without esophagitis 08/09/2015   Blood glucose elevated 08/09/2015   Encounter for screening for osteoporosis 08/09/2015   Need for vaccination 08/09/2015   Chronic MEE (middle ear effusion) 08/09/2015   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    Allergies  Allergen Reactions   Guggulipid-Black Pepper Anaphylaxis and Diarrhea   Other Shortness Of Breath    Pepper   Eggs Or Egg-Derived Products    Sulfa Antibiotics     Past Surgical History:  Procedure Laterality Date   BIOPSY BREAST     benign   BREAST BIOPSY Right 2014   core with clips;benign per pt   COLONOSCOPY  2008   cleared for 10 years/ occults- 07/22/2015- negative   INNER EAR SURGERY     benign tumor behind eardrum   PARATHYROID EXPLORATION     nodule removed   THYROID EXPLORATION  04/2017   nodule removed  Social History   Tobacco Use   Smoking status: Former    Packs/day: 0.50    Years: 30.00    Total pack years: 15.00    Types: Cigarettes    Quit date: 2003    Years since quitting: 20.4   Smokeless tobacco: Never  Vaping Use   Vaping Use: Never used  Substance Use Topics   Alcohol use: No    Alcohol/week: 0.0 standard drinks of alcohol   Drug use: No     Medication list has been reviewed and updated.  Current Meds  Medication Sig   ACCU-CHEK AVIVA PLUS test strip USE AS DIRECTED   aspirin 81 MG tablet Take 1 tablet by mouth  daily.   cetirizine (ZYRTEC) 5 MG tablet Take 1 tablet by mouth as needed.   Cholecalciferol 25 MCG (1000 UT) tablet Take 1,000 mg by mouth daily.   fluticasone (FLONASE) 50 MCG/ACT nasal spray Place 1 spray into both nostrils as needed.   glipiZIDE (GLUCOTROL XL) 5 MG 24 hr tablet Take 5 mg by mouth daily with breakfast. Dr Honor Junes   linagliptin (TRADJENTA) 5 MG TABS tablet Take 5 mg by mouth daily. Dr Honor Junes   metFORMIN (GLUCOPHAGE) 1000 MG tablet Take 1 tablet (1,000 mg total) by mouth 2 (two) times daily. (Patient taking differently: Take 1,000 mg by mouth 2 (two) times daily. Dr Honor Junes)   metoprolol tartrate (LOPRESSOR) 100 MG tablet Take 1 tablet (100 mg total) by mouth 2 (two) times daily.   omeprazole (PRILOSEC) 20 MG capsule Take 1 capsule (20 mg total) by mouth daily.   PARoxetine (PAXIL) 10 MG tablet Take 1 tablet (10 mg total) by mouth daily.   PROLIA 60 MG/ML SOSY injection Inject into the skin.   ramipril (ALTACE) 10 MG capsule Take 1 capsule (10 mg total) by mouth daily.   simvastatin (ZOCOR) 20 MG tablet Take 1 tablet (20 mg total) by mouth daily.       04/30/2022   11:28 AM 04/26/2022    4:09 PM 03/30/2022   11:00 AM 12/08/2021   10:50 AM  GAD 7 : Generalized Anxiety Score  Nervous, Anxious, on Edge 0 0 1 0  Control/stop worrying 0 0 2 0  Worry too much - different things 0 0 0 0  Trouble relaxing 0 0 0 0  Restless 0 0 0 0  Easily annoyed or irritable 0 0 0 0  Afraid - awful might happen 0 0 0 0  Total GAD 7 Score 0 0 3 0  Anxiety Difficulty Not difficult at all Not difficult at all Not difficult at all Not difficult at all       04/30/2022   11:28 AM  Depression screen PHQ 2/9  Decreased Interest 0  Down, Depressed, Hopeless 0  PHQ - 2 Score 0  Altered sleeping 0  Tired, decreased energy 0  Change in appetite 0  Feeling bad or failure about yourself  0  Trouble concentrating 0  Moving slowly or fidgety/restless 0  Suicidal thoughts 0  PHQ-9 Score 0   Difficult doing work/chores Not difficult at all    BP Readings from Last 3 Encounters:  04/30/22 130/88  04/26/22 138/86  04/19/22 (!) 180/92    Physical Exam Vitals and nursing note reviewed.  Musculoskeletal:     Left foot: Normal range of motion. Tenderness and bony tenderness present. No foot drop.     Comments: Tenderness middle aspect 3rd metatarsal left foot     Wt Readings  from Last 3 Encounters:  04/30/22 167 lb (75.8 kg)  04/26/22 167 lb (75.8 kg)  04/19/22 167 lb (75.8 kg)    BP 130/88   Pulse 72   Ht '5\' 8"'$  (1.727 m)   Wt 167 lb (75.8 kg)   BMI 25.39 kg/m   Assessment and Plan:  1. Metatarsalgia of left foot New onset.  Patient has had for several weeks.  There is been no recent trauma.  Pain is noted and tenderness is noted in the midportion of the third metatarsal of the left foot.  There is no deformity.  We will obtain an x-ray to determine if there is a stress fracture and refer to podiatry for evaluation. - DG Foot Complete Left - Ambulatory referral to Podiatry - meloxicam (MOBIC) 7.5 MG tablet; Take 1 tablet (7.5 mg total) by mouth daily.  Dispense: 30 tablet; Refill: 0

## 2022-05-01 ENCOUNTER — Other Ambulatory Visit: Payer: Self-pay

## 2022-05-01 DIAGNOSIS — M7752 Other enthesopathy of left foot: Secondary | ICD-10-CM

## 2022-05-04 ENCOUNTER — Telehealth: Payer: Self-pay | Admitting: Family Medicine

## 2022-05-04 NOTE — Telephone Encounter (Signed)
Called pt let her know the referral was sent in 6/19 to Dr. Ether Griffins. Told pt it could take up to 7 days to get a phone call. Told pt to try to call Tuesday to schedule an appointment if she has any problem to call us. Pt verbalized understanding.  KP

## 2022-05-07 ENCOUNTER — Ambulatory Visit: Payer: Self-pay

## 2022-05-07 ENCOUNTER — Telehealth: Payer: Self-pay | Admitting: Family Medicine

## 2022-05-07 ENCOUNTER — Telehealth: Payer: Self-pay

## 2022-05-07 NOTE — Telephone Encounter (Signed)
Dr. Earney Mallet office hasnt received the referral but when pt spoke with them they cant see her until late July / they advised her to try [facility] or [facility] for sooner visit / pt is wanting new referral asap due to foot pain / please advise
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
## 2022-05-18 ENCOUNTER — Encounter: Payer: Self-pay | Admitting: Podiatry

## 2022-05-18 ENCOUNTER — Ambulatory Visit: Payer: Medicare PPO | Admitting: Podiatry

## 2022-05-18 ENCOUNTER — Ambulatory Visit (INDEPENDENT_AMBULATORY_CARE_PROVIDER_SITE_OTHER): Payer: Medicare PPO

## 2022-05-18 ENCOUNTER — Ambulatory Visit: Payer: Medicare PPO

## 2022-05-18 DIAGNOSIS — M2042 Other hammer toe(s) (acquired), left foot: Secondary | ICD-10-CM

## 2022-05-18 DIAGNOSIS — S92325A Nondisplaced fracture of second metatarsal bone, left foot, initial encounter for closed fracture: Secondary | ICD-10-CM

## 2022-05-18 DIAGNOSIS — M2041 Other hammer toe(s) (acquired), right foot: Secondary | ICD-10-CM

## 2022-05-18 DIAGNOSIS — M7750 Other enthesopathy of unspecified foot: Secondary | ICD-10-CM

## 2022-05-18 NOTE — Progress Notes (Signed)
   HPI: 76 y.o. female presenting today for evaluation of chronic pain and tenderness to the left foot this been going on for about 6 weeks now.  Slowly increased in severity.  She denies a history of injury.  She has not done anything currently for treatment.  She says that x-rays were taken which were negative for fracture at that time. Patient also states that yesterday she stubbed her right fourth toe.  She would like to have it evaluated as well.  Past Medical History:  Diagnosis Date   Allergy    Anxiety    Depression    reccurent depression to due situational disturbance   Diabetes mellitus without complication ([place])    GERD (gastroesophageal reflux disease)    Hyperlipidemia    Hypertension    Osteoporosis     Past Surgical History:  Procedure Laterality Date   BIOPSY BREAST     benign   BREAST BIOPSY Right 2014   core with clips;benign per pt   COLONOSCOPY  2008   cleared for 10 years/ occults- 07/22/2015- negative   INNER EAR SURGERY     benign tumor behind eardrum   PARATHYROID EXPLORATION     nodule removed   THYROID EXPLORATION  04/2017   nodule removed    Allergies  Allergen Reactions   Guggulipid-Black Pepper Anaphylaxis and Diarrhea   Other Shortness Of Breath    Pepper   Eggs Or Egg-Derived Products    Sulfa Antibiotics      Physical Exam: General: The patient is alert and oriented x3 in no acute distress.  Dermatology: Skin is warm, dry and supple bilateral lower extremities. Negative for open lesions or macerations.  Vascular: Palpable pedal pulses bilaterally. Capillary refill within normal limits.  There is some ecchymosis with edema fourth digit right foot  Neurological: Light touch and protective threshold grossly intact  Musculoskeletal Exam: Pain on palpation noted along the base of the second metatarsal left foot.  Radiographic Exam:  Normal osseous mineralization.  Transverse fracture noted along the proximal portion of the second  metatarsal base left foot, closed, nondisplaced, initial encounter.  There does appear to be chronic old fracture of the fourth toe of the left foot as well. No fractures identified right foot.  Assessment: 1.  Stress reaction fracture base of the second metatarsal left foot, closed, nondisplaced, initial encounter 2.  Old fracture fourth digit left foot approximately 1 year ago; resolved, asymptomatic 3.  Injury right fourth toe without fracture   Plan of Care:  1. Patient evaluated. X-Rays reviewed.  2.  Informed the patient of the stress reaction fracture.  Cam boot dispensed.  Weightbearing left foot as tolerated x6 weeks 3.  Return to clinic 6 weeks for follow-up x-ray      Edrick Kins, DPM [facility]  Dr. Edrick Kins, DPM    [address], [place] [address]                Office [phone]  Fax [phone]

## 2022-05-21 ENCOUNTER — Encounter: Payer: Self-pay | Admitting: Podiatry

## 2022-06-05 ENCOUNTER — Other Ambulatory Visit: Payer: Self-pay | Admitting: Family Medicine

## 2022-06-05 DIAGNOSIS — F419 Anxiety disorder, unspecified: Secondary | ICD-10-CM

## 2022-06-07 ENCOUNTER — Ambulatory Visit: Payer: Medicare PPO | Admitting: Family Medicine

## 2022-06-07 ENCOUNTER — Encounter: Payer: Self-pay | Admitting: Family Medicine

## 2022-06-07 VITALS — BP 130/80 | HR 80 | Ht 68.0 in | Wt 165.0 lb

## 2022-06-07 DIAGNOSIS — F419 Anxiety disorder, unspecified: Secondary | ICD-10-CM

## 2022-06-07 DIAGNOSIS — E7800 Pure hypercholesterolemia, unspecified: Secondary | ICD-10-CM | POA: Diagnosis not present

## 2022-06-07 DIAGNOSIS — I10 Essential (primary) hypertension: Secondary | ICD-10-CM

## 2022-06-07 DIAGNOSIS — K219 Gastro-esophageal reflux disease without esophagitis: Secondary | ICD-10-CM | POA: Diagnosis not present

## 2022-06-07 MED ORDER — METOPROLOL TARTRATE 100 MG PO TABS: 100.0000 mg | ORAL_TABLET | Freq: Two times a day (BID) | ORAL | 1 refills | Status: DC

## 2022-06-07 MED ORDER — OMEPRAZOLE 20 MG PO CPDR: 20.0000 mg | DELAYED_RELEASE_CAPSULE | Freq: Every day | ORAL | 1 refills | Status: DC

## 2022-06-07 MED ORDER — PAROXETINE HCL 10 MG PO TABS: 10.0000 mg | ORAL_TABLET | Freq: Every day | ORAL | 0 refills | Status: DC

## 2022-06-07 MED ORDER — RAMIPRIL 10 MG PO CAPS: 10.0000 mg | ORAL_CAPSULE | Freq: Every day | ORAL | 1 refills | Status: DC

## 2022-06-07 MED ORDER — SIMVASTATIN 20 MG PO TABS
20.0000 mg | ORAL_TABLET | Freq: Every day | ORAL | 1 refills | Status: DC
Start: 2022-06-07 — End: 2022-09-25

## 2022-06-07 NOTE — Progress Notes (Signed)
Date:  06/07/2022   Name:  Bianca Hawkins   DOB:  1946/07/05   MRN:  [id number]   Chief Complaint: Gastroesophageal Reflux, Hypertension, Hyperlipidemia, and Depression  Gastroesophageal Reflux She reports no abdominal pain, no chest pain, no coughing, no dysphagia, no heartburn or no nausea. This is a chronic problem. The current episode started more than 1 year ago. The problem has been gradually improving. The symptoms are aggravated by certain foods. Pertinent negatives include no anemia, fatigue, melena, muscle weakness, orthopnea or weight loss. There are no known risk factors. She has tried a PPI for the symptoms. The treatment provided moderate relief.  Hypertension This is a chronic problem. The current episode started more than 1 year ago. The problem has been gradually improving since onset. The problem is controlled. Pertinent negatives include no blurred vision, chest pain, headaches, orthopnea, palpitations, PND or shortness of breath. There are no associated agents to hypertension. Risk factors for coronary artery disease include dyslipidemia. Past treatments include ACE inhibitors. There is no history of angina, kidney disease, CAD/MI, CVA, heart failure, left ventricular hypertrophy, PVD or retinopathy. There is no history of chronic renal disease, a hypertension causing med or renovascular disease.  Hyperlipidemia This is a chronic problem. The current episode started more than 1 year ago. She has no history of chronic renal disease. Pertinent negatives include no chest pain, focal sensory loss, focal weakness, leg pain, myalgias or shortness of breath. Current antihyperlipidemic treatment includes statins. The current treatment provides moderate improvement of lipids. There are no compliance problems.   Depression        This is a chronic problem.  The current episode started more than 1 year ago.   The onset quality is gradual.   The problem has been gradually improving  since onset.  Associated symptoms include no decreased concentration, no fatigue, no helplessness, no hopelessness, does not have insomnia, not irritable, no restlessness, no decreased interest, no appetite change, no body aches, no myalgias, no headaches, no indigestion, not sad and no suicidal ideas.     The symptoms are aggravated by nothing.  Past treatments include SSRIs - Selective serotonin reuptake inhibitors.  Compliance with treatment is variable.   Lab Results  Component Value Date   NA 140 08/04/2021   K 4.5 08/04/2021   CO2 32 10/15/2016   GLUCOSE 93 10/15/2016   BUN 15 08/04/2021   CREATININE 0.7 08/04/2021   CALCIUM 10.2 08/04/2021   GFRNONAA >60 10/15/2016   Lab Results  Component Value Date   CHOL 148 08/04/2021   HDL 60 08/04/2021   LDLCALC 66 08/04/2021   TRIG 106 08/04/2021   CHOLHDL 2.5 09/25/2016   Lab Results  Component Value Date   TSH 0.80 08/04/2021   Lab Results  Component Value Date   HGBA1C 6.7 02/09/2022   Lab Results  Component Value Date   WBC 7.8 09/01/2019   HGB 12.2 09/01/2019   HCT 37.0 09/01/2019   MCV 87.3 09/01/2019   PLT 135 (L) 09/01/2019   Lab Results  Component Value Date   ALT 17 08/04/2021   AST 19 08/04/2021   ALKPHOS 50 10/15/2016   BILITOT 0.4 10/15/2016   No results found for: "25OHVITD2", "25OHVITD3", "VD25OH"   Review of Systems  Constitutional:  Negative for appetite change, fatigue and weight loss.  Eyes:  Negative for blurred vision.  Respiratory:  Negative for cough and shortness of breath.   Cardiovascular:  Negative for chest pain, palpitations, orthopnea  and PND.  Gastrointestinal:  Negative for abdominal pain, dysphagia, heartburn, melena and nausea.  Musculoskeletal:  Negative for myalgias and muscle weakness.  Neurological:  Negative for focal weakness and headaches.  Psychiatric/Behavioral:  Positive for depression. Negative for decreased concentration and suicidal ideas. The patient does not have  insomnia.     Patient Active Problem List   Diagnosis Date Noted   Varicose veins of leg with pain 01/08/2022   Chronic venous insufficiency 01/08/2022   Nocturnal leg cramps 01/08/2022   Pure hypercholesterolemia 09/24/2018   History of biliary T-tube placement 01/22/2018   Recurrent major depressive episodes ([place]) 01/22/2018   Age-related osteoporosis without current pathological fracture 09/09/2017   Hyperlipidemia due to type 2 diabetes mellitus ([place]) 09/09/2017   Panic attacks 04/16/2017   Hyperparathyroidism, primary ([place]) 03/01/2017   Multinodular goiter 03/01/2017   Essential hypertension 06/28/2016   Chronic anxiety 06/28/2016   Type 2 diabetes mellitus without complication, without long-term current use of insulin ([place]) 08/09/2015   Familial multiple lipoprotein-type hyperlipidemia 08/09/2015   Pain in joint 08/09/2015   Encounter for general adult medical examination without abnormal findings 08/09/2015   Chronic obstructive pulmonary disease (COPD) ([place]) 08/09/2015   H/O: depression 08/09/2015   Gastroesophageal reflux disease without esophagitis 08/09/2015   Blood glucose elevated 08/09/2015   Encounter for screening for osteoporosis 08/09/2015   Need for vaccination 08/09/2015   Chronic MEE (middle ear effusion) 08/09/2015   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    Allergies  Allergen Reactions   Guggulipid-Black Pepper Anaphylaxis and Diarrhea   Other Shortness Of Breath    Pepper   Eggs Or Egg-Derived Products    Sulfa Antibiotics     Past Surgical History:  Procedure Laterality Date   BIOPSY BREAST     benign   BREAST BIOPSY Right 2014   core with clips;benign per pt   COLONOSCOPY  2008   cleared for 10 years/ occults- 07/22/2015- negative   INNER EAR SURGERY     benign tumor behind eardrum   PARATHYROID EXPLORATION     nodule removed   THYROID EXPLORATION  04/2017   nodule removed    Social History   Tobacco Use   Smoking status: Former     Packs/day: 0.50    Years: 30.00    Total pack years: 15.00    Types: Cigarettes    Quit date: 2003    Years since quitting: 20.5   Smokeless tobacco: Never  Vaping Use   Vaping Use: Never used  Substance Use Topics   Alcohol use: No    Alcohol/week: 0.0 standard drinks of alcohol   Drug use: No     Medication list has been reviewed and updated.  Current Meds  Medication Sig   ACCU-CHEK AVIVA PLUS test strip USE AS DIRECTED   aspirin 81 MG tablet Take 1 tablet by mouth daily.   cetirizine (ZYRTEC) 5 MG tablet Take 1 tablet by mouth as needed.   Cholecalciferol 25 MCG (1000 UT) tablet Take 1,000 mg by mouth daily.   fluticasone (FLONASE) 50 MCG/ACT nasal spray Place 1 spray into both nostrils as needed.   glipiZIDE (GLUCOTROL XL) 5 MG 24 hr tablet Take 5 mg by mouth daily with breakfast. Dr Honor Junes   linagliptin (TRADJENTA) 5 MG TABS tablet Take 5 mg by mouth daily. Dr Honor Junes   metFORMIN (GLUCOPHAGE) 1000 MG tablet Take 1 tablet (1,000 mg total) by mouth 2 (two) times daily.   metoprolol tartrate (LOPRESSOR) 100 MG tablet Take  1 tablet (100 mg total) by mouth 2 (two) times daily.   omeprazole (PRILOSEC) 20 MG capsule Take 1 capsule (20 mg total) by mouth daily.   PARoxetine (PAXIL) 10 MG tablet TAKE 1 TABLET BY MOUTH DAILY.   PROLIA 60 MG/ML SOSY injection Inject into the skin.   ramipril (ALTACE) 10 MG capsule Take 1 capsule (10 mg total) by mouth daily.   simvastatin (ZOCOR) 20 MG tablet Take 1 tablet (20 mg total) by mouth daily.       06/07/2022   10:12 AM 04/30/2022   11:28 AM 04/26/2022    4:09 PM 03/30/2022   11:00 AM  GAD 7 : Generalized Anxiety Score  Nervous, Anxious, on Edge 0 0 0 1  Control/stop worrying 0 0 0 2  Worry too much - different things 0 0 0 0  Trouble relaxing 0 0 0 0  Restless 0 0 0 0  Easily annoyed or irritable 0 0 0 0  Afraid - awful might happen 0 0 0 0  Total GAD 7 Score 0 0 0 3  Anxiety Difficulty Not difficult at all Not difficult  at all Not difficult at all Not difficult at all       06/07/2022   10:12 AM 04/30/2022   11:28 AM 04/26/2022    4:09 PM  Depression screen PHQ 2/9  Decreased Interest 0 0 0  Down, Depressed, Hopeless 0 0 0  PHQ - 2 Score 0 0 0  Altered sleeping 0 0 0  Tired, decreased energy 0 0 0  Change in appetite 0 0 0  Feeling bad or failure about yourself  0 0 0  Trouble concentrating 0 0 0  Moving slowly or fidgety/restless 0 0 0  Suicidal thoughts 0 0 0  PHQ-9 Score 0 0 0  Difficult doing work/chores Not difficult at all Not difficult at all Not difficult at all    BP Readings from Last 3 Encounters:  06/07/22 130/80  04/30/22 130/88  04/26/22 138/86    Physical Exam Vitals and nursing note reviewed. Exam conducted with a chaperone present.  Constitutional:      General: She is not irritable.She is not in acute distress.    Appearance: She is not diaphoretic.  HENT:     Head: Normocephalic and atraumatic.     Right Ear: Tympanic membrane and external ear normal.     Left Ear: Tympanic membrane and external ear normal.     Nose: Nose normal. No congestion or rhinorrhea.     Mouth/Throat:     Mouth: Mucous membranes are moist.     Pharynx: No oropharyngeal exudate or posterior oropharyngeal erythema.  Eyes:     General:        Right eye: No discharge.        Left eye: No discharge.     Conjunctiva/sclera: Conjunctivae normal.     Pupils: Pupils are equal, round, and reactive to light.  Neck:     Thyroid: No thyromegaly.     Vascular: No JVD.  Cardiovascular:     Rate and Rhythm: Normal rate and regular rhythm.     Heart sounds: Normal heart sounds. No murmur heard.    No friction rub. No gallop.  Pulmonary:     Effort: Pulmonary effort is normal.     Breath sounds: Normal breath sounds.  Abdominal:     General: Bowel sounds are normal.     Palpations: Abdomen is soft. There is no mass.  Tenderness: There is no abdominal tenderness. There is no guarding or rebound.   Musculoskeletal:        General: Normal range of motion.     Cervical back: Normal range of motion and neck supple.  Lymphadenopathy:     Cervical: No cervical adenopathy.  Skin:    General: Skin is warm and dry.  Neurological:     Mental Status: She is alert.     Deep Tendon Reflexes: Reflexes are normal and symmetric.     Wt Readings from Last 3 Encounters:  06/07/22 165 lb (74.8 kg)  04/30/22 167 lb (75.8 kg)  04/26/22 167 lb (75.8 kg)    BP 130/80   Pulse 80   Ht '5\' 8"'$  (1.727 m)   Wt 165 lb (74.8 kg)   BMI 25.09 kg/m   Assessment and Plan:  1. Essential hypertension Chronic.  Controlled.  Stable.  Blood pressure 130/80.  Asymptomatic.  Tolerating medications well.  Continue metoprolol 100 mg twice a day and ramipril 10 mg once a day.  Will check CMP. - metoprolol tartrate (LOPRESSOR) 100 MG tablet; Take 1 tablet (100 mg total) by mouth 2 (two) times daily.  Dispense: 180 tablet; Refill: 1 - ramipril (ALTACE) 10 MG capsule; Take 1 capsule (10 mg total) by mouth daily.  Dispense: 90 capsule; Refill: 1 - Comprehensive Metabolic Panel (CMET)  2. Gastroesophageal reflux disease without esophagitis Chronic.  Controlled.  Stable.  Continue omeprazole 20 mg once a day. - omeprazole (PRILOSEC) 20 MG capsule; Take 1 capsule (20 mg total) by mouth daily.  Dispense: 90 capsule; Refill: 1  3. Chronic anxiety Chronic.  Controlled.  Stable.  Continue Paxil 10 mg once a day. - PARoxetine (PAXIL) 10 MG tablet; Take 1 tablet (10 mg total) by mouth daily.  Dispense: 90 tablet; Refill: 0  4. Pure hypercholesterolemia Chronic.  Controlled.  Stable.  Continue simvastatin 20 mg once a day.  Will check lipid panel for current level of LDL surveillance. - simvastatin (ZOCOR) 20 MG tablet; Take 1 tablet (20 mg total) by mouth daily.  Dispense: 90 tablet; Refill: 1 - Lipid Panel With LDL/HDL Ratio

## 2022-06-08 LAB — COMPREHENSIVE METABOLIC PANEL
ALT: 19 IU/L (ref 0–32)
AST: 26 IU/L (ref 0–40)
Albumin/Globulin Ratio: 2.5 — ABNORMAL HIGH (ref 1.2–2.2)
Albumin: 4.5 g/dL (ref 3.8–4.8)
Alkaline Phosphatase: 45 IU/L (ref 44–121)
BUN/Creatinine Ratio: 25 (ref 12–28)
BUN: 20 mg/dL (ref 8–27)
Bilirubin Total: 0.2 mg/dL (ref 0.0–1.2)
CO2: 25 mmol/L (ref 20–29)
Calcium: 9.8 mg/dL (ref 8.7–10.3)
Chloride: 99 mmol/L (ref 96–106)
Creatinine, Ser: 0.8 mg/dL (ref 0.57–1.00)
Globulin, Total: 1.8 g/dL (ref 1.5–4.5)
Glucose: 135 mg/dL — ABNORMAL HIGH (ref 70–99)
Potassium: 5 mmol/L (ref 3.5–5.2)
Sodium: 140 mmol/L (ref 134–144)
Total Protein: 6.3 g/dL (ref 6.0–8.5)
eGFR: 76 mL/min/{1.73_m2} (ref >59)

## 2022-06-08 LAB — LIPID PANEL WITH LDL/HDL RATIO
Cholesterol, Total: 157 mg/dL (ref 100–199)
HDL: 62 mg/dL (ref >39)
LDL Chol Calc (NIH): 68 mg/dL (ref 0–99)
LDL/HDL Ratio: 1.1 ratio (ref 0.0–3.2)
Triglycerides: 162 mg/dL — ABNORMAL HIGH (ref 0–149)
VLDL Cholesterol Cal: 27 mg/dL (ref 5–40)

## 2022-06-29 ENCOUNTER — Ambulatory Visit (INDEPENDENT_AMBULATORY_CARE_PROVIDER_SITE_OTHER): Payer: Medicare PPO

## 2022-06-29 ENCOUNTER — Ambulatory Visit: Payer: Medicare PPO | Admitting: Podiatry

## 2022-06-29 DIAGNOSIS — M84375D Stress fracture, left foot, subsequent encounter for fracture with routine healing: Secondary | ICD-10-CM | POA: Diagnosis not present

## 2022-06-29 NOTE — Progress Notes (Signed)
Chief Complaint  Patient presents with   Follow-up    Left foot fracture.    HPI: 76 y.o. female presenting today for follow-up evaluation of a stress reaction fracture to the base of the left second metatarsal.  Patient states that she feels much better.  She no longer has any pain or tenderness associated to the foot.  She is fully weightbearing in the cam boot and she states that she has been transitioning out of the cam boot recently.  Past Medical History:  Diagnosis Date   Allergy    Anxiety    Depression    reccurent depression to due situational disturbance   Diabetes mellitus without complication ([place])    GERD (gastroesophageal reflux disease)    Hyperlipidemia    Hypertension    Osteoporosis     Past Surgical History:  Procedure Laterality Date   BIOPSY BREAST     benign   BREAST BIOPSY Right 2014   core with clips;benign per pt   COLONOSCOPY  2008   cleared for 10 years/ occults- 07/22/2015- negative   INNER EAR SURGERY     benign tumor behind eardrum   PARATHYROID EXPLORATION     nodule removed   THYROID EXPLORATION  04/2017   nodule removed    Allergies  Allergen Reactions   Guggulipid-Black Pepper Anaphylaxis and Diarrhea   Other Shortness Of Breath    Pepper   Eggs Or Egg-Derived Products    Sulfa Antibiotics      Physical Exam: General: The patient is alert and oriented x3 in no acute distress.  Dermatology: Skin is warm, dry and supple bilateral lower extremities. Negative for open lesions or macerations.  Vascular: Palpable pedal pulses bilaterally. Capillary refill within normal limits.  There is some ecchymosis with edema fourth digit right foot  Neurological: Light touch and protective threshold grossly intact  Musculoskeletal Exam: Negative for any significant pain on palpation noted along the base of the second metatarsal left foot.  Radiographic Exam LT foot 06/29/2022:  Normal osseous mineralization.  Transverse fracture noted  along the proximal portion of the second metatarsal base left foot, closed, nondisplaced, with some slight callus formation around the fracture site.  There does appear to be chronic old fracture of the fourth toe of the left foot as well which was discussed last visit.  Assessment: 1.  Stress reaction fracture base of the second metatarsal left foot, closed, nondisplaced, with routine healing, subsequent encounter 2.  Old fracture fourth digit left foot approximately 1 year ago; resolved, asymptomatic   Plan of Care:  1. Patient evaluated. X-Rays reviewed and compared to prior x-rays taken.  2.  Overall the patient is doing significantly better.  She no longer has any pain or tenderness associated to the second metatarsal of the left foot.  She may transition out of the cam boot into good supportive sneakers in tennis shoes.  Advised against going barefoot. 3.  Slowly increase activity over the next 4 weeks 4.  Return to clinic as needed      Edrick Kins, DPM [facility]  Dr. Edrick Kins, DPM    [address], [place] [address]  Office [phone]  Fax [phone]

## 2022-07-11 DIAGNOSIS — L918 Other hypertrophic disorders of the skin: Secondary | ICD-10-CM | POA: Diagnosis not present

## 2022-07-11 DIAGNOSIS — Z872 Personal history of diseases of the skin and subcutaneous tissue: Secondary | ICD-10-CM | POA: Diagnosis not present

## 2022-07-11 DIAGNOSIS — L578 Other skin changes due to chronic exposure to nonionizing radiation: Secondary | ICD-10-CM | POA: Diagnosis not present

## 2022-08-14 DIAGNOSIS — M810 Age-related osteoporosis without current pathological fracture: Secondary | ICD-10-CM | POA: Diagnosis not present

## 2022-08-18 DIAGNOSIS — R300 Dysuria: Secondary | ICD-10-CM | POA: Diagnosis not present

## 2022-08-18 DIAGNOSIS — N390 Urinary tract infection, site not specified: Secondary | ICD-10-CM | POA: Diagnosis not present

## 2022-08-23 DIAGNOSIS — M810 Age-related osteoporosis without current pathological fracture: Secondary | ICD-10-CM | POA: Diagnosis not present

## 2022-08-23 DIAGNOSIS — E042 Nontoxic multinodular goiter: Secondary | ICD-10-CM | POA: Diagnosis not present

## 2022-09-07 ENCOUNTER — Other Ambulatory Visit: Payer: Self-pay | Admitting: Family Medicine

## 2022-09-07 DIAGNOSIS — Z79899 Other long term (current) drug therapy: Secondary | ICD-10-CM | POA: Diagnosis not present

## 2022-09-07 DIAGNOSIS — E1159 Type 2 diabetes mellitus with other circulatory complications: Secondary | ICD-10-CM | POA: Diagnosis not present

## 2022-09-07 DIAGNOSIS — M810 Age-related osteoporosis without current pathological fracture: Secondary | ICD-10-CM | POA: Diagnosis not present

## 2022-09-07 DIAGNOSIS — E559 Vitamin D deficiency, unspecified: Secondary | ICD-10-CM | POA: Diagnosis not present

## 2022-09-07 DIAGNOSIS — I152 Hypertension secondary to endocrine disorders: Secondary | ICD-10-CM | POA: Diagnosis not present

## 2022-09-07 DIAGNOSIS — E785 Hyperlipidemia, unspecified: Secondary | ICD-10-CM | POA: Diagnosis not present

## 2022-09-07 DIAGNOSIS — F419 Anxiety disorder, unspecified: Secondary | ICD-10-CM

## 2022-09-07 DIAGNOSIS — E1169 Type 2 diabetes mellitus with other specified complication: Secondary | ICD-10-CM | POA: Diagnosis not present

## 2022-09-07 DIAGNOSIS — E042 Nontoxic multinodular goiter: Secondary | ICD-10-CM | POA: Diagnosis not present

## 2022-09-07 LAB — MICROALBUMIN / CREATININE URINE RATIO: Microalb Creat Ratio: <15.8

## 2022-09-07 LAB — HEMOGLOBIN A1C: Hemoglobin A1C: 6.7

## 2022-09-10 ENCOUNTER — Encounter (INDEPENDENT_AMBULATORY_CARE_PROVIDER_SITE_OTHER): Payer: Self-pay

## 2022-09-25 ENCOUNTER — Other Ambulatory Visit: Payer: Self-pay | Admitting: Family Medicine

## 2022-09-25 DIAGNOSIS — K219 Gastro-esophageal reflux disease without esophagitis: Secondary | ICD-10-CM

## 2022-09-25 DIAGNOSIS — E7800 Pure hypercholesterolemia, unspecified: Secondary | ICD-10-CM

## 2022-10-26 ENCOUNTER — Ambulatory Visit (INDEPENDENT_AMBULATORY_CARE_PROVIDER_SITE_OTHER): Payer: Medicare PPO

## 2022-10-26 DIAGNOSIS — Z0000 Encounter for general adult medical examination without abnormal findings: Secondary | ICD-10-CM

## 2022-10-26 NOTE — Progress Notes (Signed)
I connected with  Bianca Hawkins on 10/26/22 by a audio enabled telemedicine application and verified that I am speaking with the correct person using two identifiers.  Patient Location: Home  Provider Location: Office/Clinic  I discussed the limitations of evaluation and management by telemedicine. The patient expressed understanding and agreed to proceed.   Subjective:   Bianca Hawkins is a 76 y.o. female who presents for Medicare Annual (Subsequent) preventive examination.  Review of Systems    Per HPI unless specifically indicated below.  Cardiac Risk Factors include: advanced age (>82mn, >>48women);female gender, diabetes, hypertension, and hyperlipidemia.          Objective:        06/07/2022   10:12 AM 04/30/2022   11:28 AM 04/26/2022    4:25 PM  Vitals with BMI  Height _0  _1    Weight 165 lbs 167 lbs   BMI 291.47282.9  Systolic [id number] Diastolic 80 88 86  Pulse 80 72     There were no vitals filed for this visit. There is no height or weight on file to calculate BMI.     10/25/2021   10:55 AM 10/24/2020   11:12 AM 10/21/2019   11:04 AM 10/13/2018   11:19 AM 10/10/2017    9:13 AM 09/05/2015    2:18 PM 08/09/2015    9:11 AM  Advanced Directives  Does Patient Have a Medical Advance Directive? Yes Yes Yes Yes No;Yes Yes Yes  Type of AParamed[place]of [place] will [place] will Living will;Healthcare Power of [place] Living will;Healthcare Power of [place] Chart? No - copy requested No - copy requested No - copy requested No - copy requested No - copy requested No - copy requested No - copy requested    Current Medications (verified) Outpatient Encounter Medications as of 10/26/2022  Medication Sig   ACCU-CHEK AVIVA PLUS test strip USE AS DIRECTED   aspirin 81  MG tablet Take 1 tablet by mouth daily.   cetirizine (ZYRTEC) 5 MG tablet Take 1 tablet by mouth as needed.   Cholecalciferol 25 MCG (1000 UT) tablet Take 1,000 mg by mouth daily.   fluticasone (FLONASE) 50 MCG/ACT nasal spray Place 1 spray into both nostrils as needed.   glipiZIDE (GLUCOTROL XL) 5 MG 24 hr tablet Take 5 mg by mouth daily with breakfast. Dr OHonor Hawkins  linagliptin (TRADJENTA) 5 MG TABS tablet Take 5 mg by mouth daily. Dr OHonor Hawkins  metFORMIN (GLUCOPHAGE) 1000 MG tablet Take 1 tablet (1,000 mg total) by mouth 2 (two) times daily.   metoprolol tartrate (LOPRESSOR) 100 MG tablet Take 1 tablet (100 mg total) by mouth 2 (two) times daily.   omeprazole (PRILOSEC) 20 MG capsule TAKE 1 CAPSULE (40 MG) BY MOUTH EVERY DAY   PARoxetine (PAXIL) 10 MG tablet TAKE 1 TABLET BY MOUTH DAILY   PROLIA 60 MG/ML SOSY injection Inject into the skin.   ramipril (ALTACE) 10 MG capsule Take 1 capsule (10 mg total) by mouth daily.   simvastatin (ZOCOR) 20 MG tablet TAKE ONE TABLET (20 MG) BY MOUTH EVERY DAY   meloxicam (MOBIC) 7.5 MG tablet Take 1 tablet (7.5 mg total) by mouth daily. (Patient not taking: Reported on 10/26/2022)   No facility-administered encounter medications on file as of 10/26/2022.    Allergies (verified) Capsicum, Guggulipid-black pepper,  Other, Eggs or egg-derived products, and Sulfa antibiotics   History: Past Medical History:  Diagnosis Date   Allergy    Anxiety    Depression    reccurent depression to due situational disturbance   Diabetes mellitus without complication ([place])    GERD (gastroesophageal reflux disease)    Hyperlipidemia    Hypertension    Osteoporosis    Past Surgical History:  Procedure Laterality Date   BIOPSY BREAST     benign   BREAST BIOPSY Right 2014   core with clips;benign per pt   COLONOSCOPY  2008   cleared for 10 years/ occults- 07/22/2015- negative   INNER EAR SURGERY     benign tumor behind eardrum   PARATHYROID EXPLORATION      nodule removed   THYROID EXPLORATION  04/2017   nodule removed   Family History  Problem Relation Age of Onset   Ulcerative colitis Mother    Heart attack Father    Heart disease Sister    Birth defects Sister    Healthy Brother    Stroke Daughter    Healthy Son    Breast cancer Neg Hx    Social History   Socioeconomic History   Marital status: Widowed    Spouse name: Not on file   Number of children: 2   Years of education: Not on file   Highest education level: Associate degree: academic program  Occupational History   Occupation: Retired  Tobacco Use   Smoking status: Former    Packs/day: 0.50    Years: 30.00    Total pack years: 15.00    Types: Cigarettes    Quit date: 2003    Years since quitting: 20.9   Smokeless tobacco: Never  Vaping Use   Vaping Use: Never used  Substance and Sexual Activity   Alcohol use: No    Alcohol/week: 0.0 standard drinks of alcohol   Drug use: No   Sexual activity: Not Currently  Other Topics Concern   Not on file  Social History Narrative   Pt lives alone   Social Determinants of Health   Financial Resource Strain: Low Risk  (10/26/2022)   Overall Financial Resource Strain (CARDIA)    Difficulty of Paying Living Expenses: Not hard at all  Food Insecurity: No Food Insecurity (10/26/2022)   Hunger Vital Sign    Worried About Running Out of Food in the Last Year: Never true    [place] in the Last Year: Never true  Transportation Needs: No Transportation Needs (10/26/2022)   PRAPARE - Hydrologist (Medical): No    Lack of Transportation (Non-Medical): No  Physical Activity: Inactive (10/26/2022)   Exercise Vital Sign    Days of Exercise per Week: 0 days    Minutes of Exercise per Session: 0 min  Stress: No Stress Concern Present (10/26/2022)   [place]    Feeling of Stress : Not at all  Social Connections: Moderately  Integrated (10/26/2022)   Social Connection and Isolation Panel [NHANES]    Frequency of Communication with Friends and Family: More than three times a week    Frequency of Social Gatherings with Friends and Family: More than three times a week    Attends Religious Services: More than 4 times per year    Active Member of [employer] or Organizations: Yes    Attends Archivist Meetings: More than 4 times per year  Marital Status: Widowed    Tobacco Counseling Counseling given: Not Answered   Clinical Intake:  Pre-visit preparation completed: No  Pain : No/denies pain     Nutritional Status: BMI of 19-24  Normal Nutritional Risks: None Diabetes: Yes CBG done?: Yes CBG resulted in Enter/ Edit results?: No Did pt. bring in CBG monitor from home?: No  How often do you need to have someone help you when you read instructions, pamphlets, or other written materials from your doctor or pharmacy?: 1 - Never  Diabetic?Nutrition Risk Assessment:  Has the patient had any N/V/D within the last 2 months?  No  Does the patient have any non-healing wounds?  No  Has the patient had any unintentional weight loss or weight gain?  No   Diabetes:  Is the patient diabetic?  Yes  If diabetic, was a CBG obtained today?  Yes  Did the patient bring in their glucometer from home?  No  How often do you monitor your CBG's? daily.   Financial Strains and Diabetes Management:  Are you having any financial strains with the device, your supplies or your medication? No .  Does the patient want to be seen by Chronic Care Management for management of their diabetes?  No  Would the patient like to be referred to a Nutritionist or for Diabetic Management?  No   Diabetic Exams:  Diabetic Eye Exam: Completed 12/25/2021 Diabetic Foot Exam: Completed 02/09/22       Information entered by :: Donnie Mes[place], [place]   Activities of Daily Living    10/26/2022   11:45 AM  In your present state of  health, do you have any difficulty performing the following activities:  Hearing? 0  Vision? 1  Comment Dr. Ellin Mayhew  Difficulty concentrating or making decisions? 0  Walking or climbing stairs? 1  Dressing or bathing? 0  Doing errands, shopping? 0    Patient Care Team: Juline Patch, MD as PCP - General (Family Medicine) Lonia Farber, MD as Consulting Physician (Internal Medicine)  Indicate any recent Medical Services you may have received from other than [facility] providers in the past year (date may be approximate).    No hospitalizations in the past 12 months. Assessment:   This is a routine wellness examination for [place].  Hearing/Vision screen No results found.  Dietary issues and exercise activities discussed: Current Exercise Habits: The patient does not participate in regular exercise at present, Exercise limited by: None identified   Goals Addressed   None    Depression Screen    10/26/2022   11:43 AM 06/07/2022   10:12 AM 04/30/2022   11:28 AM 04/26/2022    4:09 PM 03/30/2022   10:59 AM 12/08/2021   10:49 AM 10/25/2021   10:55 AM  PHQ 2/9 Scores  PHQ - 2 Score 0 0 0 0 0 0 0  PHQ- 9 Score  0 0 0 2 0     Fall Risk    10/26/2022   11:44 AM 10/26/2022   11:43 AM 03/30/2022   11:00 AM 10/25/2021   10:56 AM 06/12/2021    1:40 PM  Fall Risk   Falls in the past year? _0 0  Number falls in past yr: _1 0  Injury with Fall? 0 0 1 0 0  Risk for fall due to : Impaired balance/gait Impaired balance/gait Impaired balance/gait History of fall(s) No Fall Risks  Follow up Falls evaluation completed Falls evaluation completed Falls  evaluation completed Falls prevention discussed Falls evaluation completed    FALL RISK PREVENTION PERTAINING TO THE HOME:  Any stairs in or around the home? No  If so, are there any without handrails? No  Home free of loose throw rugs in walkways, pet beds, electrical cords, etc? Yes  Adequate lighting in your home to reduce  risk of falls? Yes   ASSISTIVE DEVICES UTILIZED TO PREVENT FALLS:  Life alert? Yes  Use of a cane, walker or w/c? No  Grab bars in the bathroom? Yes  Shower chair or bench in shower? No  Elevated toilet seat or a handicapped toilet? No   TIMED UP AND GO:  Was the test performed?  virtual appt .  Cognitive Function:        10/26/2022   11:47 AM 10/21/2019   11:13 AM 10/10/2017    9:12 AM  6CIT Screen  What Year? 0 points 0 points 0 points  What month? 0 points 0 points 0 points  What time? 0 points 0 points 0 points  Count back from 20 0 points 0 points 0 points  Months in reverse 0 points 0 points 0 points  Repeat phrase 0 points 0 points 0 points  Total Score 0 points 0 points 0 points    Immunizations Immunization History  Administered Date(s) Administered   PFIZER(Purple Top)SARS-COV-2 Vaccination 12/30/2019, 01/20/2020, 08/30/2020, 08/18/2021   Pneumococcal Polysaccharide-23 09/03/2013   Tdap 09/03/2013   Zoster, Live 11/13/2011    TDAP status: Up to date  Flu Vaccine status: Declined, Education has been provided regarding the importance of this vaccine but patient still declined. Advised may receive this vaccine at local pharmacy or Health Dept. Aware to provide a copy of the vaccination record if obtained from local pharmacy or Health Dept. Verbalized acceptance and understanding.  Pneumococcal vaccine status: Declined,  Education has been provided regarding the importance of this vaccine but patient still declined. Advised may receive this vaccine at local pharmacy or Health Dept. Aware to provide a copy of the vaccination record if obtained from local pharmacy or Health Dept. Verbalized acceptance and understanding.   Covid-19 vaccine status: Information provided on how to obtain vaccines.   Qualifies for Shingles Vaccine? Yes   Zostavax completed Yes   Shingrix Completed?: No.    Education has been provided regarding the importance of this vaccine. Patient  has been advised to call insurance company to determine out of pocket expense if they have not yet received this vaccine. Advised may also receive vaccine at local pharmacy or Health Dept. Verbalized acceptance and understanding.  Screening Tests Health Maintenance  Topic Date Due   Zoster Vaccines- Shingrix (1 of 2) Never done   Diabetic kidney evaluation - Urine ACR  03/08/2017   COVID-19 Vaccine (5 - 2023-24 season) 07/13/2022   HEMOGLOBIN A1C  08/11/2022   INFLUENZA VACCINE  02/10/2023 (Originally 06/12/2022)   Pneumonia Vaccine 82+ Years old (2 - PCV) 10/27/2023 (Originally 09/03/2014)   OPHTHALMOLOGY EXAM  12/25/2022   FOOT EXAM  02/10/2023   MAMMOGRAM  04/21/2023   Diabetic kidney evaluation - eGFR measurement  06/08/2023   DTaP/Tdap/Td (2 - Td or Tdap) 09/04/2023   Medicare Annual Wellness (AWV)  10/27/2023   DEXA SCAN  Completed   Hepatitis C Screening  Completed   HPV VACCINES  Aged Out   Fecal DNA (Cologuard)  Discontinued    Health Maintenance  Health Maintenance Due  Topic Date Due   Zoster Vaccines- Shingrix (1 of 2)  Never done   Diabetic kidney evaluation - Urine ACR  03/08/2017   COVID-19 Vaccine (5 - 2023-24 season) 07/13/2022   HEMOGLOBIN A1C  08/11/2022    Colorectal cancer screening: No longer required.   Mammogram status: Ordered 04/30/2022. Pt provided with contact info and advised to call to schedule appt.   DEXA Scan: 05/26/2020  Lung Cancer Screening: (Low Dose CT Chest recommended if Age 60-80 years, 30 pack-year currently smoking OR have quit w/in 15years.) does not qualify.   Lung Cancer Screening Referral: not applicable   Additional Screening:  Hepatitis C Screening: does qualify; Completed 09/09/2017  Vision Screening: Recommended annual ophthalmology exams for early detection of glaucoma and other disorders of the eye. Is the patient up to date with their annual eye exam?  Yes  Who is the provider or what is the name of the office in  which the patient attends annual eye exams? Dr. Ellin Mayhew  If pt is not established with a provider, would they like to be referred to a provider to establish care? No .   Dental Screening: Recommended annual dental exams for proper oral hygiene  Community Resource Referral / Chronic Care Management: CRR required this visit?  No   CCM required this visit?  No      Plan:     I have personally reviewed and noted the following in the patient's chart:   Medical and social history Use of alcohol, tobacco or illicit drugs  Current medications and supplements including opioid prescriptions. Patient is not currently taking opioid prescriptions. Functional ability and status Nutritional status Physical activity Advanced directives List of other physicians Hospitalizations, surgeries, and ER visits in previous 12 months Vitals Screenings to include cognitive, depression, and falls Referrals and appointments  In addition, I have reviewed and discussed with patient certain preventive protocols, quality metrics, and best practice recommendations. A written personalized care plan for preventive services as well as general preventive health recommendations were provided to patient.    Bianca Hawkins , Thank you for taking time to come for your Medicare Wellness Visit. I appreciate your ongoing commitment to your health goals. Please review the following plan we discussed and let me know if I can assist you in the future.   These are the goals we discussed:  Goals      DIET - INCREASE WATER INTAKE     Recommend drinking 6-8 glasses of water per day      Exercise 150 min/wk Moderate Activity     Recommend to exercise at least 150 minutes per week        This is a list of the screening recommended for you and due dates:  Health Maintenance  Topic Date Due   Zoster (Shingles) Vaccine (1 of 2) Never done   Yearly kidney health urinalysis for diabetes  03/08/2017   COVID-19 Vaccine (5 -  2023-24 season) 07/13/2022   Hemoglobin A1C  08/11/2022   Flu Shot  02/10/2023*   Pneumonia Vaccine (2 - PCV) 10/27/2023*   Eye exam for diabetics  12/25/2022   Complete foot exam   02/10/2023   Mammogram  04/21/2023   Yearly kidney function blood test for diabetes  06/08/2023   DTaP/Tdap/Td vaccine (2 - Td or Tdap) 09/04/2023   Medicare Annual Wellness Visit  10/27/2023   DEXA scan (bone density measurement)  Completed   Hepatitis C Screening: USPSTF Recommendation to screen - Ages 46-79 yo.  Completed   HPV Vaccine  Aged Out   Cologuard (  Stool DNA test)  Discontinued  *Topic was postponed. The date shown is not the original due date.     Bianca Hawkins, [place]   10/26/2022   Nurse Notes: Approximately 30 minute Non-Face -To-Face Medicare Wellness Visit

## 2022-10-26 NOTE — Patient Instructions (Signed)

## 2022-10-29 ENCOUNTER — Ambulatory Visit: Payer: Medicare PPO

## 2022-11-06 ENCOUNTER — Ambulatory Visit: Payer: Medicare PPO | Admitting: Family Medicine

## 2022-11-06 ENCOUNTER — Encounter: Payer: Self-pay | Admitting: Family Medicine

## 2022-11-06 ENCOUNTER — Telehealth: Payer: Self-pay | Admitting: Family Medicine

## 2022-11-06 VITALS — BP 122/94 | HR 78 | Temp 98.5°F | Ht 68.0 in | Wt 166.0 lb

## 2022-11-06 DIAGNOSIS — R6889 Other general symptoms and signs: Secondary | ICD-10-CM

## 2022-11-06 DIAGNOSIS — U071 COVID-19: Secondary | ICD-10-CM | POA: Diagnosis not present

## 2022-11-06 LAB — POCT INFLUENZA A/B
Influenza A, POC: NEGATIVE
Influenza B, POC: NEGATIVE

## 2022-11-06 LAB — POC COVID19 BINAXNOW: SARS Coronavirus 2 Ag: POSITIVE — AB

## 2022-11-06 MED ORDER — PROMETHAZINE-DM 6.25-15 MG/5ML PO SYRP: 5.0000 mL | ORAL_SOLUTION | Freq: Four times a day (QID) | ORAL | 0 refills | Status: DC | PRN

## 2022-11-06 MED ORDER — BENZONATATE 100 MG PO CAPS: 100.0000 mg | ORAL_CAPSULE | Freq: Two times a day (BID) | ORAL | 0 refills | Status: DC | PRN

## 2022-11-06 MED ORDER — MOLNUPIRAVIR EUA 200MG CAPSULE: 4.0000 | ORAL_CAPSULE | Freq: Two times a day (BID) | ORAL | 0 refills | Status: AC

## 2022-11-06 NOTE — Telephone Encounter (Signed)
Copied from Brenda [phone]. Topic: General - Other >> Nov 06, 2022 10:37 AM Sabas Sous wrote: Reason for CRM: Pt called requesting to speak to Baxter Flattery, says she has a sinus infection since Friday. She declined offer for an appt. Says she does not feel like driving since she lives alone.   Best contact: [phone]

## 2022-11-06 NOTE — Progress Notes (Signed)
Date:  11/06/2022   Name:  Bianca Hawkins   DOB:  10/15/1946   MRN:  [id number]   Chief Complaint: Sinusitis (Facial pressure started Friday, cough, no fever, no color to production)  Sinusitis This is a new problem. The current episode started in the past 7 days. The problem has been waxing and waning since onset. There has been no fever. The pain is moderate. Associated symptoms include congestion, diaphoresis and sinus pressure. Pertinent negatives include no chills, coughing, ear pain, headaches, hoarse voice, neck pain, shortness of breath, sneezing, sore throat or swollen glands. Past treatments include oral decongestants. The treatment provided mild relief.    Lab Results  Component Value Date   NA 140 06/07/2022   K 5.0 06/07/2022   CO2 25 06/07/2022   GLUCOSE 135 (H) 06/07/2022   BUN 20 06/07/2022   CREATININE 0.80 06/07/2022   CALCIUM 9.8 06/07/2022   EGFR 76 06/07/2022   GFRNONAA >60 10/15/2016   Lab Results  Component Value Date   CHOL 157 06/07/2022   HDL 62 06/07/2022   LDLCALC 68 06/07/2022   TRIG 162 (H) 06/07/2022   CHOLHDL 2.5 09/25/2016   Lab Results  Component Value Date   TSH 0.80 08/04/2021   Lab Results  Component Value Date   HGBA1C 6.7 09/07/2022   Lab Results  Component Value Date   WBC 7.8 09/01/2019   HGB 12.2 09/01/2019   HCT 37.0 09/01/2019   MCV 87.3 09/01/2019   PLT 135 (L) 09/01/2019   Lab Results  Component Value Date   ALT 19 06/07/2022   AST 26 06/07/2022   ALKPHOS 45 06/07/2022   BILITOT 0.2 06/07/2022   No results found for: "25OHVITD2", "25OHVITD3", "VD25OH"   Review of Systems  Constitutional:  Positive for diaphoresis. Negative for chills.  HENT:  Positive for congestion, postnasal drip, rhinorrhea and sinus pressure. Negative for ear pain, facial swelling, hoarse voice, sinus pain, sneezing and sore throat.   Eyes:  Negative for discharge and itching.  Respiratory:  Negative for apnea, cough, chest  tightness, shortness of breath and wheezing.   Musculoskeletal:  Negative for neck pain.  Neurological:  Negative for headaches.    Patient Active Problem List   Diagnosis Date Noted   Varicose veins of leg with pain 01/08/2022   Chronic venous insufficiency 01/08/2022   Nocturnal leg cramps 01/08/2022   Pure hypercholesterolemia 09/24/2018   History of biliary T-tube placement 01/22/2018   Recurrent major depressive episodes ([place]) 01/22/2018   Age-related osteoporosis without current pathological fracture 09/09/2017   Hyperlipidemia due to type 2 diabetes mellitus ([place]) 09/09/2017   Panic attacks 04/16/2017   Hyperparathyroidism, primary ([place]) 03/01/2017   Multinodular goiter 03/01/2017   Essential hypertension 06/28/2016   Chronic anxiety 06/28/2016   Type 2 diabetes mellitus without complication, without long-term current use of insulin ([place]) 08/09/2015   Familial multiple lipoprotein-type hyperlipidemia 08/09/2015   Pain in joint 08/09/2015   Encounter for general adult medical examination without abnormal findings 08/09/2015   Chronic obstructive pulmonary disease (COPD) ([place]) 08/09/2015   H/O: depression 08/09/2015   Gastroesophageal reflux disease without esophagitis 08/09/2015   Blood glucose elevated 08/09/2015   Encounter for screening for osteoporosis 08/09/2015   Need for vaccination 08/09/2015   Chronic MEE (middle ear effusion) 08/09/2015   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[place]) 03/21/2015    Allergies  Allergen Reactions   Capsicum Shortness Of Breath   Guggulipid-Black Pepper Anaphylaxis and Diarrhea   Other Shortness Of Breath  Pepper   Eggs Or Egg-Derived Products    Sulfa Antibiotics     Past Surgical History:  Procedure Laterality Date   BIOPSY BREAST     benign   BREAST BIOPSY Right 2014   core with clips;benign per pt   COLONOSCOPY  2008   cleared for 10 years/ occults- 07/22/2015- negative   INNER EAR SURGERY     benign tumor behind eardrum    PARATHYROID EXPLORATION     nodule removed   THYROID EXPLORATION  04/2017   nodule removed    Social History   Tobacco Use   Smoking status: Former    Packs/day: 0.50    Years: 30.00    Total pack years: 15.00    Types: Cigarettes    Quit date: 2003    Years since quitting: 20.9   Smokeless tobacco: Never  Vaping Use   Vaping Use: Never used  Substance Use Topics   Alcohol use: No    Alcohol/week: 0.0 standard drinks of alcohol   Drug use: No     Medication list has been reviewed and updated.  Current Meds  Medication Sig   ACCU-CHEK AVIVA PLUS test strip USE AS DIRECTED   aspirin 81 MG tablet Take 1 tablet by mouth daily.   cetirizine (ZYRTEC) 5 MG tablet Take 1 tablet by mouth as needed.   Cholecalciferol 25 MCG (1000 UT) tablet Take 1,000 mg by mouth daily.   fluticasone (FLONASE) 50 MCG/ACT nasal spray Place 1 spray into both nostrils as needed.   glipiZIDE (GLUCOTROL XL) 5 MG 24 hr tablet Take 5 mg by mouth daily with breakfast. Dr Honor Junes   linagliptin (TRADJENTA) 5 MG TABS tablet Take 5 mg by mouth daily. Dr Honor Junes   metFORMIN (GLUCOPHAGE) 1000 MG tablet Take 1 tablet (1,000 mg total) by mouth 2 (two) times daily.   metoprolol tartrate (LOPRESSOR) 100 MG tablet Take 1 tablet (100 mg total) by mouth 2 (two) times daily.   omeprazole (PRILOSEC) 20 MG capsule TAKE 1 CAPSULE (40 MG) BY MOUTH EVERY DAY   PARoxetine (PAXIL) 10 MG tablet TAKE 1 TABLET BY MOUTH DAILY   PROLIA 60 MG/ML SOSY injection Inject into the skin.   ramipril (ALTACE) 10 MG capsule Take 1 capsule (10 mg total) by mouth daily.   simvastatin (ZOCOR) 20 MG tablet TAKE ONE TABLET (20 MG) BY MOUTH EVERY DAY       11/06/2022    1:56 PM 06/07/2022   10:12 AM 04/30/2022   11:28 AM 04/26/2022    4:09 PM  GAD 7 : Generalized Anxiety Score  Nervous, Anxious, on Edge 0 0 0 0  Control/stop worrying 0 0 0 0  Worry too much - different things 0 0 0 0  Trouble relaxing 0 0 0 0  Restless 0 0 0 0   Easily annoyed or irritable 0 0 0 0  Afraid - awful might happen 0 0 0 0  Total GAD 7 Score 0 0 0 0  Anxiety Difficulty Not difficult at all Not difficult at all Not difficult at all Not difficult at all       11/06/2022    1:56 PM 10/26/2022   11:43 AM 06/07/2022   10:12 AM  Depression screen PHQ 2/9  Decreased Interest 0 0 0  Down, Depressed, Hopeless 0 0 0  PHQ - 2 Score 0 0 0  Altered sleeping 0  0  Tired, decreased energy 0  0  Change in appetite 0  0  Feeling bad or failure about yourself  0  0  Trouble concentrating 0  0  Moving slowly or fidgety/restless 0  0  Suicidal thoughts 0  0  PHQ-9 Score 0  0  Difficult doing work/chores Not difficult at all  Not difficult at all    BP Readings from Last 3 Encounters:  11/06/22 (!) 122/94  06/07/22 130/80  04/30/22 130/88    Physical Exam HENT:     Right Ear: Tympanic membrane, ear canal and external ear normal.     Left Ear: Tympanic membrane and ear canal normal.     Nose: Nose normal.     Mouth/Throat:     Mouth: Mucous membranes are moist.  Eyes:     Extraocular Movements: Extraocular movements intact.     Pupils: Pupils are equal, round, and reactive to light.  Neck:     Vascular: No carotid bruit.  Cardiovascular:     Rate and Rhythm: Normal rate.     Heart sounds: No murmur heard.    No friction rub. No gallop.  Pulmonary:     Breath sounds: No wheezing, rhonchi or rales.  Musculoskeletal:     Cervical back: Neck supple. No tenderness.  Lymphadenopathy:     Cervical: No cervical adenopathy.     Wt Readings from Last 3 Encounters:  11/06/22 166 lb (75.3 kg)  06/07/22 165 lb (74.8 kg)  04/30/22 167 lb (75.8 kg)    BP (!) 122/94 (BP Location: Right Arm, Cuff Size: Normal)   Pulse 78   Temp 98.5 F (36.9 C) (Oral)   Ht _0  (1.727 m)   Wt 166 lb (75.3 kg)   SpO2 94%   BMI 25.24 kg/m   Assessment and Plan: 1. Flu-like symptoms [place] symptoms by patient will obtain influenza AB and COVID  test. - promethazine-dextromethorphan (PROMETHAZINE-DM) 6.25-15 MG/5ML syrup; Take 5 mLs by mouth 4 (four) times daily as needed for cough.  Dispense: 118 mL; Refill: 0 - POCT Influenza A/B - benzonatate (TESSALON) 100 MG capsule; Take 1 capsule (100 mg total) by mouth 2 (two) times daily as needed for cough.  Dispense: 20 capsule; Refill: 0  2. COVID COVID is positive.  New onset.  Persistent.  Stable.  Will initiate molnupiravir 800 mg twice a day for 5 days.  Been discussed with patient for isolation.  Patient has been given Tessalon Perles 100 mg as well as Promethazine DM for nighttime cough suppression. - molnupiravir EUA (LAGEVRIO) 200 mg CAPS capsule; Take 4 capsules (800 mg total) by mouth 2 (two) times daily for 5 days.  Dispense: 40 capsule; Refill: 0 - POC COVID-19     Otilio Miu, MD

## 2022-11-14 ENCOUNTER — Ambulatory Visit: Payer: Self-pay | Admitting: *Deleted

## 2022-11-14 NOTE — Telephone Encounter (Signed)
Summary: drainage, stuffiness and green phlegm   Patient called says still having sinus issues going on since , appt on 12/26, drainage, stuffiness and green phlegm.         Chief Complaint: Sinus Drainage Symptoms: Green sinus drainage , copious amounts. Facial pain, cheekbone right side. Cough "Coughing up the sinus drainage." Frequency: 11/06/22 positive covid Pertinent Negatives: Patient denies fever, SOB Disposition: '[]'$ ED /'[]'$ Urgent Care (no appt availability in office) / '[]'$ Appointment(In office/virtual)/ '[]'$  [facility]/ '[]'$ Home Care/ '[]'$ Refused Recommended Disposition /'[]'$ [facility]/ '[x]'$  Follow-up with PCP Additional Notes: Pt states overall feels "Much better." States "I know this is all breaking loose, just want something to totally get rid of it."  Declines appt, "I was just there." Assured pt NT would route to practice for PCPs review and final disposition. Care advise provided,, verbalizes understanding. Please advise.  Reason for Disposition  [1] Nasal discharge AND [2] present > 10 days  Answer Assessment - Initial Assessment Questions 1. ONSET: "When did the cough begin?"      12/26 with Covid 2. SEVERITY: "How bad is the cough today?"      Varies 3. SPUTUM: "Describe the color of your sputum" (none, dry cough; clear, white, yellow, green)     Greenish 4. HEMOPTYSIS: "Are you coughing up any blood?" If so ask: "How much?" (flecks, streaks, tablespoons, etc.)     no 5. DIFFICULTY BREATHING: "Are you having difficulty breathing?" If Yes, ask: "How bad is it?" (e.g., mild, moderate, severe)    - MILD: No SOB at rest, mild SOB with walking, speaks normally in sentences, can lie down, no retractions, pulse < 100.    - MODERATE: SOB at rest, SOB with minimal exertion and prefers to sit, cannot lie down flat, speaks in phrases, mild retractions, audible wheezing, pulse 100-120.    - SEVERE: Very SOB at rest, speaks in single words, struggling to breathe,  sitting hunched forward, retractions, pulse > 120      No 6. FEVER: "Do you have a fever?" If Yes, ask: "What is your temperature, how was it measured, and when did it start?"     no  10. OTHER SYMPTOMS: "Do you have any other symptoms?" (e.g., runny nose, wheezing, chest pain)       Green sinus drainage, copious amounts. Facial pain,cheekbone pressure right side  Protocols used: Cough - Acute Productive-A-AH

## 2022-12-10 ENCOUNTER — Ambulatory Visit: Payer: Medicare PPO | Admitting: Family Medicine

## 2022-12-10 ENCOUNTER — Encounter: Payer: Self-pay | Admitting: Family Medicine

## 2022-12-10 VITALS — BP 129/74 | HR 78 | Ht 68.0 in | Wt 166.0 lb

## 2022-12-10 DIAGNOSIS — K219 Gastro-esophageal reflux disease without esophagitis: Secondary | ICD-10-CM | POA: Diagnosis not present

## 2022-12-10 DIAGNOSIS — I10 Essential (primary) hypertension: Secondary | ICD-10-CM

## 2022-12-10 DIAGNOSIS — F419 Anxiety disorder, unspecified: Secondary | ICD-10-CM

## 2022-12-10 DIAGNOSIS — E7800 Pure hypercholesterolemia, unspecified: Secondary | ICD-10-CM | POA: Diagnosis not present

## 2022-12-10 MED ORDER — RAMIPRIL 10 MG PO CAPS: 10.0000 mg | ORAL_CAPSULE | Freq: Every day | ORAL | 1 refills | Status: DC

## 2022-12-10 MED ORDER — METOPROLOL TARTRATE 100 MG PO TABS: 100.0000 mg | ORAL_TABLET | Freq: Two times a day (BID) | ORAL | 1 refills | Status: DC

## 2022-12-10 MED ORDER — SIMVASTATIN 20 MG PO TABS: ORAL_TABLET | ORAL | 1 refills | Status: DC

## 2022-12-10 MED ORDER — PAROXETINE HCL 10 MG PO TABS: 10.0000 mg | ORAL_TABLET | Freq: Every day | ORAL | 1 refills | Status: DC

## 2022-12-10 MED ORDER — OMEPRAZOLE 20 MG PO CPDR: DELAYED_RELEASE_CAPSULE | ORAL | 1 refills | Status: DC

## 2022-12-10 NOTE — Progress Notes (Signed)
Date:  12/10/2022   Name:  Bianca Hawkins   DOB:  07/19/1946   MRN:  [id number]   Chief Complaint: Hypertension, Gastroesophageal Reflux, Hyperlipidemia, and Anxiety  Hypertension This is a chronic problem. The current episode started more than 1 year ago. The problem has been gradually improving since onset. The problem is controlled. Associated symptoms include anxiety. Pertinent negatives include no blurred vision, chest pain, headaches, orthopnea, palpitations or shortness of breath. There are no associated agents to hypertension. Risk factors for coronary artery disease include dyslipidemia. Past treatments include beta blockers and ACE inhibitors. The current treatment provides moderate improvement. There are no compliance problems.  There is no history of angina, kidney disease, CAD/MI, CVA, heart failure, left ventricular hypertrophy, PVD or retinopathy. There is no history of chronic renal disease, a hypertension causing med or renovascular disease.  Gastroesophageal Reflux She reports no abdominal pain, no chest pain, no choking, no dysphagia, no heartburn, no nausea, no sore throat or no wheezing. This is a chronic problem. The current episode started yesterday. The problem has been gradually improving. The symptoms are aggravated by certain foods. Pertinent negatives include no anemia or fatigue. She has tried a PPI for the symptoms. The treatment provided moderate relief.  Hyperlipidemia This is a chronic problem. The current episode started more than 1 year ago. The problem is controlled. Recent lipid tests were reviewed and are normal. She has no history of chronic renal disease. Pertinent negatives include no chest pain or shortness of breath. Current antihyperlipidemic treatment includes statins. The current treatment provides moderate improvement of lipids. There are no compliance problems.  Risk factors for coronary artery disease include dyslipidemia.  Anxiety Presents for  follow-up visit. Patient reports no chest pain, compulsions, confusion, decreased concentration, depressed mood, dizziness, excessive worry, irritability, malaise, muscle tension, nausea, nervous/anxious behavior, obsessions, palpitations, restlessness or shortness of breath. The severity of symptoms is mild.      Lab Results  Component Value Date   NA 140 06/07/2022   K 5.0 06/07/2022   CO2 25 06/07/2022   GLUCOSE 135 (H) 06/07/2022   BUN 20 06/07/2022   CREATININE 0.80 06/07/2022   CALCIUM 9.8 06/07/2022   EGFR 76 06/07/2022   GFRNONAA >60 10/15/2016   Lab Results  Component Value Date   CHOL 157 06/07/2022   HDL 62 06/07/2022   LDLCALC 68 06/07/2022   TRIG 162 (H) 06/07/2022   CHOLHDL 2.5 09/25/2016   Lab Results  Component Value Date   TSH 0.80 08/04/2021   Lab Results  Component Value Date   HGBA1C 6.7 09/07/2022   Lab Results  Component Value Date   WBC 7.8 09/01/2019   HGB 12.2 09/01/2019   HCT 37.0 09/01/2019   MCV 87.3 09/01/2019   PLT 135 (L) 09/01/2019   Lab Results  Component Value Date   ALT 19 06/07/2022   AST 26 06/07/2022   ALKPHOS 45 06/07/2022   BILITOT 0.2 06/07/2022   No results found for: "25OHVITD2", "25OHVITD3", "VD25OH"   Review of Systems  Constitutional:  Negative for diaphoresis, fatigue, irritability and unexpected weight change.  HENT:  Negative for sore throat and trouble swallowing.   Eyes:  Negative for blurred vision.  Respiratory:  Negative for choking, chest tightness, shortness of breath and wheezing.   Cardiovascular:  Negative for chest pain, palpitations and orthopnea.  Gastrointestinal:  Negative for abdominal distention, abdominal pain, blood in stool, dysphagia, heartburn and nausea.  Musculoskeletal:  Positive for arthralgias.  Neurological:  Negative for dizziness and headaches.  Psychiatric/Behavioral:  Negative for confusion and decreased concentration. The patient is not nervous/anxious.     Patient Active  Problem List   Diagnosis Date Noted   Varicose veins of leg with pain 01/08/2022   Chronic venous insufficiency 01/08/2022   Nocturnal leg cramps 01/08/2022   Pure hypercholesterolemia 09/24/2018   History of biliary T-tube placement 01/22/2018   Recurrent major depressive episodes ([place]) 01/22/2018   Age-related osteoporosis without current pathological fracture 09/09/2017   Hyperlipidemia due to type 2 diabetes mellitus ([place]) 09/09/2017   Panic attacks 04/16/2017   Hyperparathyroidism, primary ([place]) 03/01/2017   Multinodular goiter 03/01/2017   Essential hypertension 06/28/2016   Chronic anxiety 06/28/2016   Type 2 diabetes mellitus without complication, without long-term current use of insulin ([place]) 08/09/2015   Familial multiple lipoprotein-type hyperlipidemia 08/09/2015   Pain in joint 08/09/2015   Encounter for general adult medical examination without abnormal findings 08/09/2015   Chronic obstructive pulmonary disease (COPD) ([place]) 08/09/2015   H/O: depression 08/09/2015   Gastroesophageal reflux disease without esophagitis 08/09/2015   Blood glucose elevated 08/09/2015   Encounter for screening for osteoporosis 08/09/2015   Need for vaccination 08/09/2015   Chronic MEE (middle ear effusion) 08/09/2015   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    Allergies  Allergen Reactions   Capsicum Shortness Of Breath   Guggulipid-Black Pepper Anaphylaxis and Diarrhea   Other Shortness Of Breath    Pepper   Eggs Or Egg-Derived Products    Sulfa Antibiotics     Past Surgical History:  Procedure Laterality Date   BIOPSY BREAST     benign   BREAST BIOPSY Right 2014   core with clips;benign per pt   COLONOSCOPY  2008   cleared for 10 years/ occults- 07/22/2015- negative   INNER EAR SURGERY     benign tumor behind eardrum   PARATHYROID EXPLORATION     nodule removed   THYROID EXPLORATION  04/2017   nodule removed    Social History   Tobacco Use   Smoking status: Former     Packs/day: 0.50    Years: 30.00    Total pack years: 15.00    Types: Cigarettes    Quit date: 2003    Years since quitting: 21.0   Smokeless tobacco: Never  Vaping Use   Vaping Use: Never used  Substance Use Topics   Alcohol use: No    Alcohol/week: 0.0 standard drinks of alcohol   Drug use: No     Medication list has been reviewed and updated.  Current Meds  Medication Sig   ACCU-CHEK AVIVA PLUS test strip USE AS DIRECTED   aspirin 81 MG tablet Take 1 tablet by mouth daily.   cetirizine (ZYRTEC) 5 MG tablet Take 1 tablet by mouth as needed.   Cholecalciferol 25 MCG (1000 UT) tablet Take 1,000 mg by mouth daily.   fluticasone (FLONASE) 50 MCG/ACT nasal spray Place 1 spray into both nostrils as needed.   glipiZIDE (GLUCOTROL XL) 5 MG 24 hr tablet Take 5 mg by mouth daily with breakfast. Dr Honor Junes   linagliptin (TRADJENTA) 5 MG TABS tablet Take 5 mg by mouth daily. Dr Honor Junes   metFORMIN (GLUCOPHAGE) 1000 MG tablet Take 1 tablet (1,000 mg total) by mouth 2 (two) times daily.   metoprolol tartrate (LOPRESSOR) 100 MG tablet Take 1 tablet (100 mg total) by mouth 2 (two) times daily.   omeprazole (PRILOSEC) 20 MG capsule TAKE 1 CAPSULE (40 MG) BY MOUTH EVERY  DAY   PARoxetine (PAXIL) 10 MG tablet TAKE 1 TABLET BY MOUTH DAILY   PROLIA 60 MG/ML SOSY injection Inject into the skin.   ramipril (ALTACE) 10 MG capsule Take 1 capsule (10 mg total) by mouth daily.   simvastatin (ZOCOR) 20 MG tablet TAKE ONE TABLET (20 MG) BY MOUTH EVERY DAY       12/10/2022   10:13 AM 11/06/2022    1:56 PM 06/07/2022   10:12 AM 04/30/2022   11:28 AM  GAD 7 : Generalized Anxiety Score  Nervous, Anxious, on Edge 0 0 0 0  Control/stop worrying 0 0 0 0  Worry too much - different things 0 0 0 0  Trouble relaxing 0 0 0 0  Restless 0 0 0 0  Easily annoyed or irritable 0 0 0 0  Afraid - awful might happen 0 0 0 0  Total GAD 7 Score 0 0 0 0  Anxiety Difficulty Not difficult at all Not difficult at  all Not difficult at all Not difficult at all       12/10/2022   10:12 AM 11/06/2022    1:56 PM 10/26/2022   11:43 AM  Depression screen PHQ 2/9  Decreased Interest 0 0 0  Down, Depressed, Hopeless 0 0 0  PHQ - 2 Score 0 0 0  Altered sleeping 0 0   Tired, decreased energy 0 0   Change in appetite 0 0   Feeling bad or failure about yourself  0 0   Trouble concentrating 0 0   Moving slowly or fidgety/restless 0 0   Suicidal thoughts 0 0   PHQ-9 Score 0 0   Difficult doing work/chores Not difficult at all Not difficult at all     BP Readings from Last 3 Encounters:  12/10/22 129/74  11/06/22 (!) 122/94  06/07/22 130/80    Physical Exam Vitals and nursing note reviewed. Exam conducted with a chaperone present.  Constitutional:      General: She is not in acute distress.    Appearance: She is not diaphoretic.  HENT:     Head: Normocephalic and atraumatic.     Right Ear: Tympanic membrane, ear canal and external ear normal.     Left Ear: Tympanic membrane, ear canal and external ear normal.     Nose: Nose normal. No congestion or rhinorrhea.     Mouth/Throat:     Mouth: Mucous membranes are moist.  Eyes:     General:        Right eye: No discharge.        Left eye: No discharge.     Conjunctiva/sclera: Conjunctivae normal.     Pupils: Pupils are equal, round, and reactive to light.  Neck:     Thyroid: No thyromegaly.     Vascular: No JVD.  Cardiovascular:     Rate and Rhythm: Normal rate and regular rhythm.     Heart sounds: Normal heart sounds. No murmur heard.    No friction rub. No gallop.  Pulmonary:     Effort: Pulmonary effort is normal.     Breath sounds: Normal breath sounds. No wheezing or rhonchi.  Abdominal:     General: Bowel sounds are normal.     Palpations: Abdomen is soft. There is no mass.     Tenderness: There is no abdominal tenderness. There is no guarding.  Musculoskeletal:        General: Normal range of motion.     Cervical back: Normal  range  of motion and neck supple.  Lymphadenopathy:     Cervical: No cervical adenopathy.  Skin:    General: Skin is warm and dry.  Neurological:     Mental Status: She is alert.     Deep Tendon Reflexes: Reflexes are normal and symmetric.     Wt Readings from Last 3 Encounters:  12/10/22 166 lb (75.3 kg)  11/06/22 166 lb (75.3 kg)  06/07/22 165 lb (74.8 kg)    BP 129/74   Pulse 78   Ht '5\' 8"'$  (1.727 m)   Wt 166 lb (75.3 kg)   SpO2 95%   BMI 25.24 kg/m   Assessment and Plan: 1. Essential hypertension Chronic.  Controlled.  Stable.  Blood pressure 129/74.  Asymptomatic.  Tolerating medications well.  Continue metoprolol 100 mg 1 twice a day and ramipril 10 mg once a day.  Will recheck patient in 6 months - metoprolol tartrate (LOPRESSOR) 100 MG tablet; Take 1 tablet (100 mg total) by mouth 2 (two) times daily.  Dispense: 180 tablet; Refill: 1 - ramipril (ALTACE) 10 MG capsule; Take 1 capsule (10 mg total) by mouth daily.  Dispense: 90 capsule; Refill: 1  2. Gastroesophageal reflux disease without esophagitis Chronic.  Controlled.  Stable.  Patient asymptomatic as long as she takes her Prilosec 20 mg once every day. - omeprazole (PRILOSEC) 20 MG capsule; TAKE 1 CAPSULE (40 MG) BY MOUTH EVERY DAY  Dispense: 90 capsule; Refill: 1  3. Chronic anxiety Chronic.  Controlled.  Stable.  GAD score is 0.  Patient has never been better stable in terms of anxiety and will continue Paxil 10 mg once a day.  Will recheck in 6 months. - PARoxetine (PAXIL) 10 MG tablet; Take 1 tablet (10 mg total) by mouth daily.  Dispense: 90 tablet; Refill: 1  4. Pure hypercholesterolemia Chronic.  Controlled.  Stable.  Continue simvastatin 20 mg once a day.  Will check lipid panel for current status of LDL control. - simvastatin (ZOCOR) 20 MG tablet; TAKE ONE TABLET (20 MG) BY MOUTH EVERY DAY  Dispense: 90 tablet; Refill: 1 - Lipid Panel With LDL/HDL Ratio     Otilio Miu, MD

## 2022-12-11 LAB — LIPID PANEL WITH LDL/HDL RATIO
Cholesterol, Total: 156 mg/dL (ref 100–199)
HDL: 63 mg/dL (ref >39)
LDL Chol Calc (NIH): 74 mg/dL (ref 0–99)
LDL/HDL Ratio: 1.2 ratio (ref 0.0–3.2)
Triglycerides: 105 mg/dL (ref 0–149)
VLDL Cholesterol Cal: 19 mg/dL (ref 5–40)

## 2022-12-26 DIAGNOSIS — H35033 Hypertensive retinopathy, bilateral: Secondary | ICD-10-CM | POA: Diagnosis not present

## 2022-12-26 DIAGNOSIS — E119 Type 2 diabetes mellitus without complications: Secondary | ICD-10-CM | POA: Diagnosis not present

## 2022-12-26 LAB — HM DIABETES EYE EXAM

## 2023-02-20 DIAGNOSIS — E119 Type 2 diabetes mellitus without complications: Secondary | ICD-10-CM | POA: Diagnosis not present

## 2023-03-15 DIAGNOSIS — E119 Type 2 diabetes mellitus without complications: Secondary | ICD-10-CM | POA: Diagnosis not present

## 2023-03-15 DIAGNOSIS — I152 Hypertension secondary to endocrine disorders: Secondary | ICD-10-CM | POA: Diagnosis not present

## 2023-03-15 DIAGNOSIS — E1159 Type 2 diabetes mellitus with other circulatory complications: Secondary | ICD-10-CM | POA: Diagnosis not present

## 2023-03-15 DIAGNOSIS — M810 Age-related osteoporosis without current pathological fracture: Secondary | ICD-10-CM | POA: Diagnosis not present

## 2023-03-15 LAB — HEMOGLOBIN A1C: Hemoglobin A1C: 6.5

## 2023-03-15 LAB — HM DIABETES FOOT EXAM: HM Diabetic Foot Exam: NORMAL

## 2023-04-11 ENCOUNTER — Other Ambulatory Visit: Payer: Self-pay | Admitting: Family Medicine

## 2023-04-11 DIAGNOSIS — Z1231 Encounter for screening mammogram for malignant neoplasm of breast: Secondary | ICD-10-CM

## 2023-04-24 ENCOUNTER — Ambulatory Visit
Admission: RE | Admit: 2023-04-24 | Discharge: 2023-04-24 | Disposition: A | Discharge status: Home / Self Care | Payer: Medicare PPO | Source: Ambulatory Visit | Attending: Family Medicine | Admitting: Family Medicine

## 2023-04-24 DIAGNOSIS — Z1231 Encounter for screening mammogram for malignant neoplasm of breast: Secondary | ICD-10-CM | POA: Diagnosis not present

## 2023-06-10 ENCOUNTER — Ambulatory Visit: Payer: Medicare PPO | Admitting: Family Medicine

## 2023-06-10 ENCOUNTER — Encounter: Payer: Self-pay | Admitting: Family Medicine

## 2023-06-10 VITALS — BP 118/72 | HR 76 | Ht 68.0 in | Wt 162.0 lb

## 2023-06-10 DIAGNOSIS — I10 Essential (primary) hypertension: Secondary | ICD-10-CM | POA: Diagnosis not present

## 2023-06-10 DIAGNOSIS — F419 Anxiety disorder, unspecified: Secondary | ICD-10-CM | POA: Diagnosis not present

## 2023-06-10 DIAGNOSIS — E7800 Pure hypercholesterolemia, unspecified: Secondary | ICD-10-CM | POA: Diagnosis not present

## 2023-06-10 DIAGNOSIS — J301 Allergic rhinitis due to pollen: Secondary | ICD-10-CM | POA: Diagnosis not present

## 2023-06-10 DIAGNOSIS — L219 Seborrheic dermatitis, unspecified: Secondary | ICD-10-CM | POA: Diagnosis not present

## 2023-06-10 DIAGNOSIS — K219 Gastro-esophageal reflux disease without esophagitis: Secondary | ICD-10-CM | POA: Diagnosis not present

## 2023-06-10 LAB — HM DIABETES EYE EXAM

## 2023-06-10 MED ORDER — FLUTICASONE PROPIONATE 50 MCG/ACT NA SUSP: 1.0000 | NASAL | 0 refills | Status: AC | PRN

## 2023-06-10 MED ORDER — KETOCONAZOLE 2 % EX SHAM
1.0000 | MEDICATED_SHAMPOO | CUTANEOUS | 0 refills | Status: DC
Start: 2023-06-10 — End: 2023-09-12

## 2023-06-10 MED ORDER — RAMIPRIL 10 MG PO CAPS
10.0000 mg | ORAL_CAPSULE | Freq: Every day | ORAL | 1 refills | Status: DC
Start: 2023-06-10 — End: 2023-10-02

## 2023-06-10 MED ORDER — OMEPRAZOLE 20 MG PO CPDR
DELAYED_RELEASE_CAPSULE | ORAL | 1 refills | Status: DC
Start: 2023-06-10 — End: 2023-10-14

## 2023-06-10 MED ORDER — PAROXETINE HCL 10 MG PO TABS
10.0000 mg | ORAL_TABLET | Freq: Every day | ORAL | 1 refills | Status: DC
Start: 2023-06-10 — End: 2023-12-12

## 2023-06-10 MED ORDER — METOPROLOL TARTRATE 100 MG PO TABS
100.0000 mg | ORAL_TABLET | Freq: Two times a day (BID) | ORAL | 1 refills | Status: DC
Start: 2023-06-10 — End: 2023-12-12

## 2023-06-10 MED ORDER — SIMVASTATIN 20 MG PO TABS
ORAL_TABLET | ORAL | 1 refills | Status: DC
Start: 2023-06-10 — End: 2023-12-12

## 2023-06-10 NOTE — Progress Notes (Signed)
Date:  06/10/2023   Name:  Bianca Hawkins   DOB:  11-27-1945   MRN:  [id number]   Chief Complaint: Allergic Rhinitis , Hypertension, Gastroesophageal Reflux, Hyperlipidemia, Anxiety (Wants referral to Dr Edgewood Callas), and itchy scalp  Hypertension This is a chronic problem. The current episode started more than 1 year ago. The problem has been gradually improving since onset. The problem is controlled. Associated symptoms include anxiety. Pertinent negatives include no chest pain, headaches, palpitations, PND or shortness of breath. There are no associated agents to hypertension. Risk factors for coronary artery disease include dyslipidemia. Past treatments include ACE inhibitors and beta blockers. The current treatment provides moderate improvement. There are no compliance problems.  There is no history of chronic renal disease, a hypertension causing med or renovascular disease.  Gastroesophageal Reflux She reports no abdominal pain, no chest pain, no choking, no coughing, no dysphagia, no heartburn, no nausea, no sore throat or no wheezing. This is a chronic problem. The problem occurs occasionally. The problem has been gradually improving. Pertinent negatives include no fatigue. She has tried a PPI for the symptoms. The treatment provided moderate relief.  Hyperlipidemia This is a chronic problem. The current episode started more than 1 year ago. The problem is controlled. Recent lipid tests were reviewed and are normal. She has no history of chronic renal disease, diabetes, hypothyroidism, liver disease, obesity or nephrotic syndrome. There are no known factors aggravating her hyperlipidemia. Pertinent negatives include no chest pain, focal sensory loss, focal weakness, leg pain, myalgias or shortness of breath. Current antihyperlipidemic treatment includes statins. The current treatment provides moderate improvement of lipids. There are no compliance problems.  Risk factors for coronary artery  disease include dyslipidemia and hypertension.  Anxiety Presents for follow-up visit. Patient reports no chest pain, compulsions, confusion, dizziness, excessive worry, feeling of choking, irritability, malaise, muscle tension, nausea, nervous/anxious behavior, palpitations, restlessness or shortness of breath. Symptoms occur occasionally. The quality of sleep is poor.      Lab Results  Component Value Date   NA 140 06/07/2022   K 5.0 06/07/2022   CO2 25 06/07/2022   GLUCOSE 135 (H) 06/07/2022   BUN 20 06/07/2022   CREATININE 0.80 06/07/2022   CALCIUM 9.8 06/07/2022   EGFR 76 06/07/2022   GFRNONAA >60 10/15/2016   Lab Results  Component Value Date   CHOL 156 12/10/2022   HDL 63 12/10/2022   LDLCALC 74 12/10/2022   TRIG 105 12/10/2022   CHOLHDL 2.5 09/25/2016   Lab Results  Component Value Date   TSH 0.80 08/04/2021   Lab Results  Component Value Date   HGBA1C 6.5 03/15/2023   Lab Results  Component Value Date   WBC 7.8 09/01/2019   HGB 12.2 09/01/2019   HCT 37.0 09/01/2019   MCV 87.3 09/01/2019   PLT 135 (L) 09/01/2019   Lab Results  Component Value Date   ALT 19 06/07/2022   AST 26 06/07/2022   ALKPHOS 45 06/07/2022   BILITOT 0.2 06/07/2022   No results found for: "25OHVITD2", "25OHVITD3", "VD25OH"   Review of Systems  Constitutional: Negative.  Negative for chills, fatigue, fever, irritability and unexpected weight change.  HENT:  Negative for congestion, ear discharge, ear pain, rhinorrhea, sinus pressure, sneezing and sore throat.   Respiratory:  Negative for cough, choking, shortness of breath, wheezing and stridor.   Cardiovascular:  Negative for chest pain, palpitations and PND.  Gastrointestinal:  Negative for abdominal pain, blood in stool, constipation, diarrhea, dysphagia, heartburn  and nausea.  Genitourinary:  Negative for dysuria, flank pain, frequency, hematuria, urgency and vaginal discharge.  Musculoskeletal:  Negative for arthralgias, back  pain and myalgias.  Skin:  Negative for rash.  Neurological:  Negative for dizziness, focal weakness, weakness and headaches.  Hematological:  Negative for adenopathy. Does not bruise/bleed easily.  Psychiatric/Behavioral:  Negative for confusion and dysphoric mood. The patient is not nervous/anxious.     Patient Active Problem List   Diagnosis Date Noted   Varicose veins of leg with pain 01/08/2022   Chronic venous insufficiency 01/08/2022   Nocturnal leg cramps 01/08/2022   Pure hypercholesterolemia 09/24/2018   History of biliary T-tube placement 01/22/2018   Recurrent major depressive episodes (HCC) 01/22/2018   Age-related osteoporosis without current pathological fracture 09/09/2017   Hyperlipidemia due to type 2 diabetes mellitus (HCC) 09/09/2017   Panic attacks 04/16/2017   Hyperparathyroidism, primary (HCC) 03/01/2017   Multinodular goiter 03/01/2017   Essential hypertension 06/28/2016   Chronic anxiety 06/28/2016   Type 2 diabetes mellitus without complication, without long-term current use of insulin (HCC) 08/09/2015   Familial multiple lipoprotein-type hyperlipidemia 08/09/2015   Pain in joint 08/09/2015   Encounter for general adult medical examination without abnormal findings 08/09/2015   Chronic obstructive pulmonary disease (COPD) (HCC) 08/09/2015   H/O: depression 08/09/2015   Gastroesophageal reflux disease without esophagitis 08/09/2015   Blood glucose elevated 08/09/2015   Encounter for screening for osteoporosis 08/09/2015   Need for vaccination 08/09/2015   Chronic MEE (middle ear effusion) 08/09/2015   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    Allergies  Allergen Reactions   Capsicum Shortness Of Breath   Guggulipid-Black Pepper Anaphylaxis and Diarrhea   Other Shortness Of Breath    Pepper   Egg-Derived Products    Sulfa Antibiotics     Past Surgical History:  Procedure Laterality Date   BIOPSY BREAST     benign   BREAST BIOPSY Right 2014    core with clips;benign per pt   BREAST CYST ASPIRATION     COLONOSCOPY  2008   cleared for 10 years/ occults- 07/22/2015- negative   INNER EAR SURGERY     benign tumor behind eardrum   PARATHYROID EXPLORATION     nodule removed   THYROID EXPLORATION  04/2017   nodule removed    Social History   Tobacco Use   Smoking status: Former    Current packs/day: 0.00    Average packs/day: 0.5 packs/day for 30.0 years (15.0 ttl pk-yrs)    Types: Cigarettes    Start date: 63    Quit date: 2003    Years since quitting: 21.5   Smokeless tobacco: Never  Vaping Use   Vaping status: Never Used  Substance Use Topics   Alcohol use: No    Alcohol/week: 0.0 standard drinks of alcohol   Drug use: No     Medication list has been reviewed and updated.  Current Meds  Medication Sig   ACCU-CHEK AVIVA PLUS test strip USE AS DIRECTED   aspirin 81 MG tablet Take 1 tablet by mouth daily.   Blood Glucose Monitoring Suppl (ACCU-CHEK GUIDE ME) w/Device KIT SMARTSIG:Via Meter   cetirizine (ZYRTEC) 5 MG tablet Take 1 tablet by mouth as needed.   Cholecalciferol 25 MCG (1000 UT) tablet Take 1,000 mg by mouth daily.   fluticasone (FLONASE) 50 MCG/ACT nasal spray Place 1 spray into both nostrils as needed.   glipiZIDE (GLUCOTROL XL) 5 MG 24 hr tablet Take 5 mg by mouth daily  with breakfast. Dr Gershon Crane   linagliptin (TRADJENTA) 5 MG TABS tablet Take 5 mg by mouth daily. Dr Gershon Crane   metFORMIN (GLUCOPHAGE) 1000 MG tablet Take 1 tablet (1,000 mg total) by mouth 2 (two) times daily.   metoprolol tartrate (LOPRESSOR) 100 MG tablet Take 1 tablet (100 mg total) by mouth 2 (two) times daily.   omeprazole (PRILOSEC) 20 MG capsule TAKE 1 CAPSULE (40 MG) BY MOUTH EVERY DAY   PARoxetine (PAXIL) 10 MG tablet Take 1 tablet (10 mg total) by mouth daily.   PROLIA 60 MG/ML SOSY injection Inject into the skin.   ramipril (ALTACE) 10 MG capsule Take 1 capsule (10 mg total) by mouth daily.   simvastatin (ZOCOR) 20  MG tablet TAKE ONE TABLET (20 MG) BY MOUTH EVERY DAY       06/10/2023   10:54 AM 12/10/2022   10:13 AM 11/06/2022    1:56 PM 06/07/2022   10:12 AM  GAD 7 : Generalized Anxiety Score  Nervous, Anxious, on Edge 0 0 0 0  Control/stop worrying 0 0 0 0  Worry too much - different things 0 0 0 0  Trouble relaxing 0 0 0 0  Restless 0 0 0 0  Easily annoyed or irritable 0 0 0 0  Afraid - awful might happen 0 0 0 0  Total GAD 7 Score 0 0 0 0  Anxiety Difficulty Not difficult at all Not difficult at all Not difficult at all Not difficult at all       06/10/2023   10:54 AM 12/10/2022   10:12 AM 11/06/2022    1:56 PM  Depression screen PHQ 2/9  Decreased Interest 0 0 0  Down, Depressed, Hopeless 0 0 0  PHQ - 2 Score 0 0 0  Altered sleeping 0 0 0  Tired, decreased energy 0 0 0  Change in appetite 0 0 0  Feeling bad or failure about yourself  0 0 0  Trouble concentrating 0 0 0  Moving slowly or fidgety/restless 0 0 0  Suicidal thoughts 0 0 0  PHQ-9 Score 0 0 0  Difficult doing work/chores Not difficult at all Not difficult at all Not difficult at all    BP Readings from Last 3 Encounters:  06/10/23 118/72  12/10/22 129/74  11/06/22 (!) 122/94    Physical Exam Vitals and nursing note reviewed. Exam conducted with a chaperone present.  Constitutional:      General: She is not in acute distress.    Appearance: She is not diaphoretic.  HENT:     Head: Normocephalic and atraumatic.     Right Ear: Tympanic membrane and external ear normal.     Left Ear: Tympanic membrane and external ear normal.     Nose: Nose normal.     Mouth/Throat:     Mouth: Mucous membranes are moist.  Eyes:     General:        Right eye: No discharge.        Left eye: No discharge.     Conjunctiva/sclera: Conjunctivae normal.     Pupils: Pupils are equal, round, and reactive to light.  Neck:     Thyroid: No thyromegaly.     Vascular: No JVD.  Cardiovascular:     Rate and Rhythm: Normal rate and  regular rhythm.     Heart sounds: Normal heart sounds. No murmur heard.    No friction rub. No gallop.  Pulmonary:     Effort: Pulmonary effort is normal.  Breath sounds: Normal breath sounds. No wheezing, rhonchi or rales.  Abdominal:     General: Bowel sounds are normal.     Palpations: Abdomen is soft. There is no mass.     Tenderness: There is no abdominal tenderness. There is no guarding or rebound.  Musculoskeletal:        General: No tenderness or deformity. Normal range of motion.     Cervical back: Normal range of motion and neck supple.  Lymphadenopathy:     Cervical: No cervical adenopathy.  Skin:    General: Skin is warm and dry.  Neurological:     Mental Status: She is alert.     Deep Tendon Reflexes: Reflexes are normal and symmetric.     Wt Readings from Last 3 Encounters:  06/10/23 162 lb (73.5 kg)  12/10/22 166 lb (75.3 kg)  11/06/22 166 lb (75.3 kg)    BP 118/72   Pulse 76   Ht 5\' 8"  (1.727 m)   Wt 162 lb (73.5 kg)   SpO2 95%   BMI 24.63 kg/m   Assessment and Plan: 1. Essential hypertension Chronic.  Controlled.  Stable.  Blood pressure 118/72.  Asymptomatic.  Tolerating medications well.  Continue metoprolol 100 mg 1 twice a day and ramipril 10 mg once a day.  Will recheck in 6 months.  Will check CMP for electrolytes and GFR. - metoprolol tartrate (LOPRESSOR) 100 MG tablet; Take 1 tablet (100 mg total) by mouth 2 (two) times daily.  Dispense: 180 tablet; Refill: 1 - ramipril (ALTACE) 10 MG capsule; Take 1 capsule (10 mg total) by mouth daily.  Dispense: 90 capsule; Refill: 1 - Comprehensive Metabolic Panel (CMET)  2. Non-seasonal allergic rhinitis due to pollen .  Controlled.  Stable.  Continue Flonase nasal spray 1 spray each nostril as needed daily.  Will refer to ENT and that patient would like to know what she is allergic today.  I have explained that her diarrhea may be secondary to morning intolerance to certain foods due to lack of enzymes as  we gotten older and not really truly an allergy.  Patient would like to discuss this with an allergist specialist and if is not sufficient.  Will next suggest referral to GI for evaluation of malabsorptive concerns. - fluticasone (FLONASE) 50 MCG/ACT nasal spray; Place 1 spray into both nostrils as needed.  Dispense: 16 g; Refill: 0 - Ambulatory referral to ENT  3. Gastroesophageal reflux disease without esophagitis Chronic.  Controlled.  Stable.  Continue omeprazole 20 mg once a day.  Will recheck in 6 months. - omeprazole (PRILOSEC) 20 MG capsule; TAKE 1 CAPSULE (40 MG) BY MOUTH EVERY DAY  Dispense: 90 capsule; Refill: 1  4. Chronic anxiety Chronic.  Controlled.  Stable.  PHQ 0 GAD score is 0 continue Paxil 10 mg once a day. - PARoxetine (PAXIL) 10 MG tablet; Take 1 tablet (10 mg total) by mouth daily.  Dispense: 90 tablet; Refill: 1  5. Pure hypercholesterolemia Chronic.  Controlled.  Stable.  Encouraged low-cholesterol low triglyceride dietary guidelines.  Patient is also encouraged to continue simvastatin 20 mg once a day. - simvastatin (ZOCOR) 20 MG tablet; TAKE ONE TABLET (20 MG) BY MOUTH EVERY DAY  Dispense: 90 tablet; Refill: 1  6. Seborrheic dermatitis of scalp Patient has an area of itchiness with some small bumps projecting along the nape of the hairline on the neck.  This may be due to insect bites or the possibility of some seborrheic dermatitis and we  will treat with prescription dosing Nizoral shampoo 2% at twice a week. - ketoconazole (NIZORAL) 2 % shampoo; Apply 1 Application topically 2 (two) times a week.  Dispense: 120 mL; Refill: 0     Elizabeth Sauer, MD

## 2023-07-16 DIAGNOSIS — L918 Other hypertrophic disorders of the skin: Secondary | ICD-10-CM | POA: Diagnosis not present

## 2023-07-16 DIAGNOSIS — L578 Other skin changes due to chronic exposure to nonionizing radiation: Secondary | ICD-10-CM | POA: Diagnosis not present

## 2023-07-16 DIAGNOSIS — Z872 Personal history of diseases of the skin and subcutaneous tissue: Secondary | ICD-10-CM | POA: Diagnosis not present

## 2023-08-28 DIAGNOSIS — I152 Hypertension secondary to endocrine disorders: Secondary | ICD-10-CM | POA: Diagnosis not present

## 2023-08-28 DIAGNOSIS — E1159 Type 2 diabetes mellitus with other circulatory complications: Secondary | ICD-10-CM | POA: Diagnosis not present

## 2023-08-28 DIAGNOSIS — E1169 Type 2 diabetes mellitus with other specified complication: Secondary | ICD-10-CM | POA: Diagnosis not present

## 2023-08-28 DIAGNOSIS — E042 Nontoxic multinodular goiter: Secondary | ICD-10-CM | POA: Diagnosis not present

## 2023-08-28 DIAGNOSIS — M810 Age-related osteoporosis without current pathological fracture: Secondary | ICD-10-CM | POA: Diagnosis not present

## 2023-08-28 DIAGNOSIS — E538 Deficiency of other specified B group vitamins: Secondary | ICD-10-CM | POA: Diagnosis not present

## 2023-08-28 DIAGNOSIS — E119 Type 2 diabetes mellitus without complications: Secondary | ICD-10-CM | POA: Diagnosis not present

## 2023-08-28 DIAGNOSIS — E785 Hyperlipidemia, unspecified: Secondary | ICD-10-CM | POA: Diagnosis not present

## 2023-09-12 ENCOUNTER — Other Ambulatory Visit: Payer: Self-pay | Admitting: Family Medicine

## 2023-09-12 ENCOUNTER — Ambulatory Visit: Payer: Medicare PPO | Admitting: Family Medicine

## 2023-09-12 ENCOUNTER — Encounter: Payer: Self-pay | Admitting: Family Medicine

## 2023-09-12 VITALS — BP 150/90 | HR 69 | Ht 68.0 in | Wt 165.0 lb

## 2023-09-12 DIAGNOSIS — L219 Seborrheic dermatitis, unspecified: Secondary | ICD-10-CM

## 2023-09-12 DIAGNOSIS — B370 Candidal stomatitis: Secondary | ICD-10-CM | POA: Diagnosis not present

## 2023-09-12 DIAGNOSIS — F419 Anxiety disorder, unspecified: Secondary | ICD-10-CM

## 2023-09-12 MED ORDER — KETOCONAZOLE 2 % EX SHAM
1.0000 | MEDICATED_SHAMPOO | CUTANEOUS | 0 refills | Status: AC
Start: 2023-09-12 — End: ?

## 2023-09-12 MED ORDER — NYSTATIN 100000 UNIT/ML MT SUSP
5.0000 mL | Freq: Four times a day (QID) | OROMUCOSAL | 0 refills | Status: AC
Start: 2023-09-12 — End: ?

## 2023-09-12 MED ORDER — KETOCONAZOLE 2 % EX SHAM: 1.0000 | MEDICATED_SHAMPOO | CUTANEOUS | 0 refills | Status: AC

## 2023-09-12 NOTE — Progress Notes (Signed)
Date:  09/12/2023   Name:  Bianca Hawkins   DOB:  1946/06/12   MRN:  [id number]   Chief Complaint: [place] Asters Tongue (Started yesterday morning. Sore to eat and swallow and everything tastes bad. And its burning on tongue. She said she has been using Listerine mouth wash.) and Dermatitis (Of scalp. Patient wants refill on shampoo.)  Patient is a 77  year old female who presents for a stomatitis exam. The patient reports the following problems: monilia/using alcohol based mouthwash. Health maintenance has been reviewed up to date      Lab Results  Component Value Date   NA 140 06/10/2023   K 5.3 (H) 06/10/2023   CO2 27 06/10/2023   GLUCOSE 142 (H) 06/10/2023   BUN 18 06/10/2023   CREATININE 0.73 06/10/2023   CALCIUM 10.8 (H) 06/10/2023   EGFR 85 06/10/2023   GFRNONAA >60 10/15/2016   Lab Results  Component Value Date   CHOL 156 12/10/2022   HDL 63 12/10/2022   LDLCALC 74 12/10/2022   TRIG 105 12/10/2022   CHOLHDL 2.5 09/25/2016   Lab Results  Component Value Date   TSH 0.80 08/04/2021   Lab Results  Component Value Date   HGBA1C 6.5 03/15/2023   Lab Results  Component Value Date   WBC 7.8 09/01/2019   HGB 12.2 09/01/2019   HCT 37.0 09/01/2019   MCV 87.3 09/01/2019   PLT 135 (L) 09/01/2019   Lab Results  Component Value Date   ALT 17 06/10/2023   AST 23 06/10/2023   ALKPHOS 44 06/10/2023   BILITOT 0.6 06/10/2023   No results found for: "25OHVITD2", "25OHVITD3", "VD25OH"   Review of Systems  Constitutional: Negative.  Negative for chills, fatigue, fever and unexpected weight change.  HENT:  Negative for congestion, ear discharge, mouth sores, nosebleeds, rhinorrhea and sneezing.   Respiratory:  Negative for chest tightness and shortness of breath.   Cardiovascular:  Negative for chest pain and palpitations.  Gastrointestinal:  Negative for abdominal pain, blood in stool, constipation, diarrhea and nausea.  Genitourinary:  Negative for dysuria, flank  pain, urgency and vaginal discharge.  Musculoskeletal:  Negative for arthralgias, back pain and myalgias.  Skin:  Negative for rash.  Neurological:  Negative for dizziness, weakness and headaches.  Hematological:  Negative for adenopathy. Does not bruise/bleed easily.  Psychiatric/Behavioral:  Negative for dysphoric mood. The patient is not nervous/anxious.     Patient Active Problem List   Diagnosis Date Noted   Varicose veins of leg with pain 01/08/2022   Chronic venous insufficiency 01/08/2022   Nocturnal leg cramps 01/08/2022   Pure hypercholesterolemia 09/24/2018   History of biliary T-tube placement 01/22/2018   Recurrent major depressive episodes (HCC) 01/22/2018   Age-related osteoporosis without current pathological fracture 09/09/2017   Hyperlipidemia due to type 2 diabetes mellitus (HCC) 09/09/2017   Panic attacks 04/16/2017   Hyperparathyroidism, primary (HCC) 03/01/2017   Multinodular goiter 03/01/2017   Essential hypertension 06/28/2016   Chronic anxiety 06/28/2016   Type 2 diabetes mellitus without complication, without long-term current use of insulin (HCC) 08/09/2015   Familial multiple lipoprotein-type hyperlipidemia 08/09/2015   Pain in joint 08/09/2015   Encounter for general adult medical examination without abnormal findings 08/09/2015   Chronic obstructive pulmonary disease (COPD) (HCC) 08/09/2015   H/O: depression 08/09/2015   Gastroesophageal reflux disease without esophagitis 08/09/2015   Blood glucose elevated 08/09/2015   Encounter for screening for osteoporosis 08/09/2015   Need for vaccination 08/09/2015   Chronic  MEE (middle ear effusion) 08/09/2015   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    Allergies  Allergen Reactions   Capsicum Shortness Of Breath   Guggulipid-Black Pepper Anaphylaxis and Diarrhea   Other Shortness Of Breath    Pepper   Egg-Derived Products    Sulfa Antibiotics     Past Surgical History:  Procedure Laterality Date    BIOPSY BREAST     benign   BREAST BIOPSY Right 2014   core with clips;benign per pt   BREAST CYST ASPIRATION     COLONOSCOPY  2008   cleared for 10 years/ occults- 07/22/2015- negative   INNER EAR SURGERY     benign tumor behind eardrum   PARATHYROID EXPLORATION     nodule removed   THYROID EXPLORATION  04/2017   nodule removed    Social History   Tobacco Use   Smoking status: Former    Current packs/day: 0.00    Average packs/day: 0.5 packs/day for 30.0 years (15.0 ttl pk-yrs)    Types: Cigarettes    Start date: 84    Quit date: 2003    Years since quitting: 21.8   Smokeless tobacco: Never  Vaping Use   Vaping status: Never Used  Substance Use Topics   Alcohol use: No    Alcohol/week: 0.0 standard drinks of alcohol   Drug use: No     Medication list has been reviewed and updated.  Current Meds  Medication Sig   ACCU-CHEK AVIVA PLUS test strip USE AS DIRECTED   aspirin 81 MG tablet Take 1 tablet by mouth daily.   Blood Glucose Monitoring Suppl (ACCU-CHEK GUIDE ME) w/Device KIT SMARTSIG:Via Meter   cetirizine (ZYRTEC) 5 MG tablet Take 1 tablet by mouth as needed.   Cholecalciferol 25 MCG (1000 UT) tablet Take 1,000 mg by mouth daily.   fluticasone (FLONASE) 50 MCG/ACT nasal spray Place 1 spray into both nostrils as needed.   glipiZIDE (GLUCOTROL XL) 5 MG 24 hr tablet Take 5 mg by mouth daily with breakfast. Dr Gershon Crane   ketoconazole (NIZORAL) 2 % shampoo Apply 1 Application topically 2 (two) times a week.   linagliptin (TRADJENTA) 5 MG TABS tablet Take 5 mg by mouth daily. Dr Gershon Crane   metFORMIN (GLUCOPHAGE) 1000 MG tablet Take 1 tablet (1,000 mg total) by mouth 2 (two) times daily.   metoprolol tartrate (LOPRESSOR) 100 MG tablet Take 1 tablet (100 mg total) by mouth 2 (two) times daily.   omeprazole (PRILOSEC) 20 MG capsule TAKE 1 CAPSULE (40 MG) BY MOUTH EVERY DAY   PARoxetine (PAXIL) 10 MG tablet Take 1 tablet (10 mg total) by mouth daily.   PROLIA 60  MG/ML SOSY injection Inject into the skin.   ramipril (ALTACE) 10 MG capsule Take 1 capsule (10 mg total) by mouth daily.   simvastatin (ZOCOR) 20 MG tablet TAKE ONE TABLET (20 MG) BY MOUTH EVERY DAY       09/12/2023    3:52 PM 06/10/2023   10:54 AM 12/10/2022   10:13 AM 11/06/2022    1:56 PM  GAD 7 : Generalized Anxiety Score  Nervous, Anxious, on Edge 1 0 0 0  Control/stop worrying 0 0 0 0  Worry too much - different things 0 0 0 0  Trouble relaxing 0 0 0 0  Restless 0 0 0 0  Easily annoyed or irritable 0 0 0 0  Afraid - awful might happen 0 0 0 0  Total GAD 7 Score 1 0 0 0  Anxiety Difficulty Not difficult at all Not difficult at all Not difficult at all Not difficult at all       09/12/2023    3:52 PM 06/10/2023   10:54 AM 12/10/2022   10:12 AM  Depression screen PHQ 2/9  Decreased Interest 0 0 0  Down, Depressed, Hopeless 0 0 0  PHQ - 2 Score 0 0 0  Altered sleeping 0 0 0  Tired, decreased energy 0 0 0  Change in appetite 0 0 0  Feeling bad or failure about yourself  0 0 0  Trouble concentrating 0 0 0  Moving slowly or fidgety/restless 0 0 0  Suicidal thoughts 0 0 0  PHQ-9 Score 0 0 0  Difficult doing work/chores Not difficult at all Not difficult at all Not difficult at all    BP Readings from Last 3 Encounters:  09/12/23 (!) 150/90  06/10/23 118/72  12/10/22 129/74    Physical Exam Vitals and nursing note reviewed.  HENT:     Head: Normocephalic.     Right Ear: Tympanic membrane and ear canal normal.     Left Ear: Tympanic membrane and ear canal normal.     Nose: Nose normal.     Mouth/Throat:     Mouth: Mucous membranes are moist.  Eyes:     Pupils: Pupils are equal, round, and reactive to light.  Cardiovascular:     Rate and Rhythm: Normal rate.     Pulses: Normal pulses.     Heart sounds: No murmur heard.    No friction rub. No gallop.  Pulmonary:     Breath sounds: No wheezing, rhonchi or rales.  Musculoskeletal:     Cervical back: Normal  range of motion.     Wt Readings from Last 3 Encounters:  09/12/23 165 lb (74.8 kg)  06/10/23 162 lb (73.5 kg)  12/10/22 166 lb (75.3 kg)    BP (!) 150/90   Pulse 69   Ht 5\' 8"  (1.727 m)   Wt 165 lb (74.8 kg)   SpO2 95%   BMI 25.09 kg/m   Assessment and Plan: 1. Seborrheic dermatitis of scalp Chronic.  Controlled.  Stable.  Will refill ketoconazole Nizoral shampoo to be applied 2 times a week. - ketoconazole (NIZORAL) 2 % shampoo; Apply 1 Application topically 2 (two) times a week.  Dispense: 120 mL; Refill: 0  2. Moniliasis of mouth Onset.  Patient has been using Listerine and does have dentures.  I suspect the normal mouth flora has been altered by the use of an alcohol-based mouthwash.  Patient will be eating yogurt and we will treat with nystatin suspension teaspoon 4 times a day. - nystatin (MYCOSTATIN) 100000 UNIT/ML suspension; Take 5 mLs (500,000 Units total) by mouth 4 (four) times daily.  Dispense: 60 mL; Refill: 0     Elizabeth Sauer, MD

## 2023-09-12 NOTE — Telephone Encounter (Signed)
Requested medication (s) are due for refill today: Yes  Requested medication (s) are on the active medication list: Yes  Last refill:  06/10/23  Future visit scheduled: Yes  Notes to clinic:  Not delegated.    Requested Prescriptions  Pending Prescriptions Disp Refills   ketoconazole (NIZORAL) 2 % shampoo 120 mL 0    Sig: Apply 1 Application topically 2 (two) times a week.     Not Delegated - Over the Counter: OTC 2 Failed - 09/12/2023  3:52 PM      Failed - This refill cannot be delegated      Passed - Valid encounter within last 12 months    Recent Outpatient Visits           Today    [facility]   3 months ago Essential hypertension   [place] [facility]   9 months ago Essential hypertension   [place] [facility]   10 months ago Flu-like symptoms   [place] [facility]   1 year ago Essential hypertension   [place] [facility]       Future Appointments             In 3 months Duanne Limerick, MD [facility] at [facility], [facility]

## 2023-09-12 NOTE — Telephone Encounter (Signed)
Medication Refill - Medication: ketoconazole (NIZORAL) 2 % shampoo   Has the patient contacted their pharmacy? No   Preferred Pharmacy (with phone number or street name):  [facility] - [place], [place] - [address] Phone: [phone]  Fax: [phone]      Has the patient been seen for an appointment in the last year OR does the patient have an upcoming appointment? Yes.    Please assist patient further

## 2023-09-12 NOTE — Telephone Encounter (Signed)
Refused Paxil 10 mg because it's being requested too soon.

## 2023-10-02 ENCOUNTER — Other Ambulatory Visit: Payer: Self-pay | Admitting: Family Medicine

## 2023-10-02 DIAGNOSIS — I10 Essential (primary) hypertension: Secondary | ICD-10-CM

## 2023-10-08 DIAGNOSIS — L400 Psoriasis vulgaris: Secondary | ICD-10-CM | POA: Diagnosis not present

## 2023-10-14 ENCOUNTER — Other Ambulatory Visit: Payer: Self-pay | Admitting: Family Medicine

## 2023-10-14 DIAGNOSIS — K219 Gastro-esophageal reflux disease without esophagitis: Secondary | ICD-10-CM

## 2023-10-30 ENCOUNTER — Ambulatory Visit: Payer: Medicare PPO

## 2023-10-30 DIAGNOSIS — Z780 Asymptomatic menopausal state: Secondary | ICD-10-CM | POA: Diagnosis not present

## 2023-10-30 DIAGNOSIS — Z0000 Encounter for general adult medical examination without abnormal findings: Secondary | ICD-10-CM

## 2023-10-30 NOTE — Patient Instructions (Addendum)
Bianca Hawkins , Thank you for taking time to come for your Medicare Wellness Visit. I appreciate your ongoing commitment to your health goals. Please review the following plan we discussed and let me know if I can assist you in the future.   Referrals/Orders/Follow-Ups/Clinician Recommendations: BONE DENSITY SCAN REFERRAL SENT  You have an order for:  []   2D Mammogram  []   3D Mammogram  [x]   Bone Density     Please call for appointment:  [facility]  [address] [place] [address] [phone]  [facility] [address] [place], [place] [address] [phone]  [facility] [address] Geanie Logan [place], [place] [address] [phone]    Make sure to wear two-piece clothing.  No lotions, powders, or deodorants the day of the appointment. Make sure to bring picture ID and insurance card.  Bring list of medications you are currently taking including any supplements.   Schedule your [facility] screening mammogram through MyChart!   Log into your MyChart account.  Go to 'Visit' (or 'Appointments' if on mobile App) --> Schedule an Appointment  Under 'Select a Reason for Visit' choose the Mammogram Screening option.  Complete the pre-visit questions and select the time and place that best fits your schedule.    This is a list of the screening recommended for you and due dates:  Health Maintenance  Topic Date Due   Zoster (Shingles) Vaccine (1 of 2) 10/02/1965   Pneumonia Vaccine (2 of 2 - PCV) 09/03/2014   COVID-19 Vaccine (5 - 2024-25 season) 07/14/2023   DTaP/Tdap/Td vaccine (2 - Td or Tdap) 09/04/2023   Yearly kidney health urinalysis for diabetes  09/08/2023   Hemoglobin A1C  09/15/2023   Flu Shot  02/10/2024*   Complete foot exam   03/14/2024   Mammogram  04/23/2024   Yearly kidney function blood test for diabetes  06/09/2024   Eye  exam for diabetics  06/09/2024   Medicare Annual Wellness Visit  10/29/2024   DEXA scan (bone density measurement)  Completed   Hepatitis C Screening  Completed   HPV Vaccine  Aged Out   Cologuard (Stool DNA test)  Discontinued  *Topic was postponed. The date shown is not the original due date.    Advanced directives: (ACP Link)Information on Advanced Care Planning can be found at [facility] of [place] Advance Health Care Directives Advance Health Care Directives ([email or web address]   Next Medicare Annual Wellness Visit scheduled for next year: Yes   11/11/24 @ 11:30 AM BY VIDEO

## 2023-10-30 NOTE — Progress Notes (Signed)
Subjective:   Bianca Hawkins is a 77 y.o. female who presents for Medicare Annual (Subsequent) preventive examination.  Visit Complete: Virtual I connected with  Kittie Plater on 10/30/23 by a audio enabled telemedicine application and verified that I am speaking with the correct person using two identifiers.  Patient Location: Home  Provider Location: Office/Clinic  I discussed the limitations of evaluation and management by telemedicine. The patient expressed understanding and agreed to proceed.  Vital Signs: Because this visit was a virtual/telehealth visit, some criteria may be missing or patient reported. Any vitals not documented were not able to be obtained and vitals that have been documented are patient reported.  Cardiac Risk Factors include: advanced age (>42men, >20 women);dyslipidemia;diabetes mellitus;hypertension     Objective:    Today's Vitals   10/30/23 1333  PainSc: 0-No pain   There is no height or weight on file to calculate BMI.     10/30/2023    1:39 PM 10/26/2022   12:02 PM 10/25/2021   10:55 AM 10/24/2020   11:12 AM 10/21/2019   11:04 AM 10/13/2018   11:19 AM 10/10/2017    9:13 AM  Advanced Directives  Does Patient Have a Medical Advance Directive? No Yes Yes Yes Yes Yes No;Yes  Type of Special educational needs teacher of [employer] Power of [place];Living will Healthcare Power of [place];Living will Healthcare Power of [place];Living will Living will;Healthcare Power of [employer] Power of [place];Living will  Does patient want to make changes to medical advance directive?  No - Patient declined       Copy of Healthcare Power of Attorney in Chart?  No - copy requested No - copy requested No - copy requested No - copy requested No - copy requested No - copy requested  Would patient like information on creating a medical advance directive? No - Patient declined          Current Medications (verified) Outpatient  Encounter Medications as of 10/30/2023  Medication Sig   ACCU-CHEK AVIVA PLUS test strip USE AS DIRECTED   aspirin 81 MG tablet Take 1 tablet by mouth daily.   Blood Glucose Monitoring Suppl (ACCU-CHEK GUIDE ME) w/Device KIT SMARTSIG:Via Meter   cetirizine (ZYRTEC) 5 MG tablet Take 1 tablet by mouth as needed.   Cholecalciferol 25 MCG (1000 UT) tablet Take 1,000 mg by mouth daily.   fluticasone (FLONASE) 50 MCG/ACT nasal spray Place 1 spray into both nostrils as needed.   glipiZIDE (GLUCOTROL XL) 5 MG 24 hr tablet Take 5 mg by mouth daily with breakfast. Dr Gershon Crane   ketoconazole (NIZORAL) 2 % shampoo Apply 1 Application topically 2 (two) times a week.   ketoconazole (NIZORAL) 2 % shampoo Apply 1 Application topically 2 (two) times a week.   linagliptin (TRADJENTA) 5 MG TABS tablet Take 5 mg by mouth daily. Dr Gershon Crane   metFORMIN (GLUCOPHAGE) 1000 MG tablet Take 1 tablet (1,000 mg total) by mouth 2 (two) times daily.   metoprolol tartrate (LOPRESSOR) 100 MG tablet Take 1 tablet (100 mg total) by mouth 2 (two) times daily.   nystatin (MYCOSTATIN) 100000 UNIT/ML suspension Take 5 mLs (500,000 Units total) by mouth 4 (four) times daily.   omeprazole (PRILOSEC) 20 MG capsule TAKE 1 CAPSULE BY MOUTH EVERY DAY   PARoxetine (PAXIL) 10 MG tablet Take 1 tablet (10 mg total) by mouth daily.   PROLIA 60 MG/ML SOSY injection Inject into the skin.   ramipril (ALTACE) 10 MG capsule TAKE 1 CAPSULE  BY MOUTH EVERY DAY   simvastatin (ZOCOR) 20 MG tablet TAKE ONE TABLET (20 MG) BY MOUTH EVERY DAY   No facility-administered encounter medications on file as of 10/30/2023.    Allergies (verified) Capsicum, Guggulipid-black pepper, Other, Egg-derived products, and Sulfa antibiotics   History: Past Medical History:  Diagnosis Date   Allergy    Anxiety    Depression    reccurent depression to due situational disturbance   Diabetes mellitus without complication (HCC)    GERD (gastroesophageal reflux  disease)    Hyperlipidemia    Hypertension    Osteoporosis    Past Surgical History:  Procedure Laterality Date   BIOPSY BREAST     benign   BREAST BIOPSY Right 2014   core with clips;benign per pt   BREAST CYST ASPIRATION     COLONOSCOPY  2008   cleared for 10 years/ occults- 07/22/2015- negative   INNER EAR SURGERY     benign tumor behind eardrum   PARATHYROID EXPLORATION     nodule removed   THYROID EXPLORATION  04/2017   nodule removed   Family History  Problem Relation Age of Onset   Ulcerative colitis Mother    Heart attack Father    Heart disease Sister    Birth defects Sister    Healthy Brother    Stroke Daughter    Healthy Son    Breast cancer Neg Hx    Social History   Socioeconomic History   Marital status: Widowed    Spouse name: Not on file   Number of children: 2   Years of education: Not on file   Highest education level: Associate degree: academic program  Occupational History   Occupation: Retired  Tobacco Use   Smoking status: Former    Current packs/day: 0.00    Average packs/day: 0.5 packs/day for 30.0 years (15.0 ttl pk-yrs)    Types: Cigarettes    Start date: 71    Quit date: 2003    Years since quitting: 21.9   Smokeless tobacco: Never  Vaping Use   Vaping status: Never Used  Substance and Sexual Activity   Alcohol use: No    Alcohol/week: 0.0 standard drinks of alcohol   Drug use: No   Sexual activity: Not Currently  Other Topics Concern   Not on file  Social History Narrative   Pt lives alone   Social Drivers of Health   Financial Resource Strain: Low Risk  (10/30/2023)   Overall Financial Resource Strain (CARDIA)    Difficulty of Paying Living Expenses: Not hard at all  Food Insecurity: No Food Insecurity (10/30/2023)   Hunger Vital Sign    Worried About Running Out of Food in the Last Year: Never true    Ran Out of Food in the Last Year: Never true  Transportation Needs: No Transportation Needs (10/30/2023)    PRAPARE - Administrator, Civil Service (Medical): No    Lack of Transportation (Non-Medical): No  Physical Activity: Insufficiently Active (10/30/2023)   Exercise Vital Sign    Days of Exercise per Week: 3 days    Minutes of Exercise per Session: 30 min  Stress: No Stress Concern Present (10/30/2023)   [employer] of Occupational Health - Occupational Stress Questionnaire    Feeling of Stress : Not at all  Social Connections: Moderately Isolated (10/30/2023)   Social Connection and Isolation Panel [NHANES]    Frequency of Communication with Friends and Family: More than three times a week  Frequency of Social Gatherings with Friends and Family: Twice a week    Attends Religious Services: Never    Database administrator or Organizations: Yes    Attends Engineer, structural: More than 4 times per year    Marital Status: Widowed    Tobacco Counseling Counseling given: Not Answered   Clinical Intake:  Pre-visit preparation completed: Yes  Pain : No/denies pain Pain Score: 0-No pain     BMI - recorded: 25.1 Nutritional Status: BMI 25 -29 Overweight Nutritional Risks: None Diabetes: Yes CBG done?: No Did pt. bring in CBG monitor from home?: No  How often do you need to have someone help you when you read instructions, pamphlets, or other written materials from your doctor or pharmacy?: 1 - Never  Interpreter Needed?: No  Information entered by :: Kennedy Bucker, LPN   Activities of Daily Living    10/30/2023    1:41 PM  In your present state of health, do you have any difficulty performing the following activities:  Hearing? 0  Vision? 0  Difficulty concentrating or making decisions? 0  Walking or climbing stairs? 0  Dressing or bathing? 0  Doing errands, shopping? 0  Preparing Food and eating ? N  Using the Toilet? N  In the past six months, have you accidently leaked urine? N  Do you have problems with loss of bowel control? N   Managing your Medications? N  Managing your Finances? N  Housekeeping or managing your Housekeeping? N    Patient Care Team: Duanne Limerick, MD as PCP - General (Family Medicine) Sherlon Handing, MD as Consulting Physician (Internal Medicine) Isla Pence, OD (Optometry)  Indicate any recent Medical Services you may have received from other than [facility] providers in the past year (date may be approximate).     Assessment:   This is a routine wellness examination for [place].  Hearing/Vision screen Hearing Screening - Comments:: No aids Vision Screening - Comments:: Wears glasses- Dr.Woodard   Goals Addressed             This Visit's Progress    DIET - INCREASE WATER INTAKE         Depression Screen    10/30/2023    1:37 PM 09/12/2023    3:52 PM 06/10/2023   10:54 AM 12/10/2022   10:12 AM 11/06/2022    1:56 PM 10/26/2022   11:43 AM 06/07/2022   10:12 AM  PHQ 2/9 Scores  PHQ - 2 Score 0 0 0 0 0 0 0  PHQ- 9 Score 0 0 0 0 0  0    Fall Risk    10/30/2023    1:41 PM 09/12/2023    3:52 PM 06/10/2023   10:54 AM 12/10/2022   10:12 AM 11/06/2022    1:56 PM  Fall Risk   Falls in the past year? 0 0 0 0 0  Number falls in past yr: 0 0 0 0 0  Injury with Fall? 0 0 0 0 0  Risk for fall due to : History of fall(s) No Fall Risks No Fall Risks No Fall Risks No Fall Risks  Follow up Falls prevention discussed;Falls evaluation completed Falls evaluation completed Falls evaluation completed Falls evaluation completed;Falls prevention discussed Falls evaluation completed    MEDICARE RISK AT HOME: Medicare Risk at Home Any stairs in or around the home?: No If so, are there any without handrails?: No Home free of loose throw rugs in walkways, pet beds,  electrical cords, etc?: Yes Adequate lighting in your home to reduce risk of falls?: Yes Life alert?: No Use of a cane, walker or w/c?: No Grab bars in the bathroom?: Yes Shower chair or bench in shower?: No Elevated  toilet seat or a handicapped toilet?: No  TIMED UP AND GO:  Was the test performed?  No    Cognitive Function:        10/30/2023    1:42 PM 10/26/2022   11:47 AM 10/21/2019   11:13 AM 10/10/2017    9:12 AM  6CIT Screen  What Year? 0 points 0 points 0 points 0 points  What month? 0 points 0 points 0 points 0 points  What time? 0 points 0 points 0 points 0 points  Count back from 20 0 points 0 points 0 points 0 points  Months in reverse 0 points 0 points 0 points 0 points  Repeat phrase 0 points 0 points 0 points 0 points  Total Score 0 points 0 points 0 points 0 points    Immunizations Immunization History  Administered Date(s) Administered   PFIZER(Purple Top)SARS-COV-2 Vaccination 12/30/2019, 01/20/2020, 08/30/2020, 08/18/2021   Pneumococcal Polysaccharide-23 09/03/2013   Tdap 09/03/2013   Zoster, Live 11/13/2011    TDAP status: Due, Education has been provided regarding the importance of this vaccine. Advised may receive this vaccine at local pharmacy or Health Dept. Aware to provide a copy of the vaccination record if obtained from local pharmacy or Health Dept. Verbalized acceptance and understanding.  Flu Vaccine status: Declined, Education has been provided regarding the importance of this vaccine but patient still declined. Advised may receive this vaccine at local pharmacy or Health Dept. Aware to provide a copy of the vaccination record if obtained from local pharmacy or Health Dept. Verbalized acceptance and understanding.  Pneumococcal vaccine status: Due, Education has been provided regarding the importance of this vaccine. Advised may receive this vaccine at local pharmacy or Health Dept. Aware to provide a copy of the vaccination record if obtained from local pharmacy or Health Dept. Verbalized acceptance and understanding.  Covid-19 vaccine status: Completed vaccines  Qualifies for Shingles Vaccine? Yes   Zostavax completed No   Shingrix Completed?: No.     Education has been provided regarding the importance of this vaccine. Patient has been advised to call insurance company to determine out of pocket expense if they have not yet received this vaccine. Advised may also receive vaccine at local pharmacy or Health Dept. Verbalized acceptance and understanding.  Screening Tests Health Maintenance  Topic Date Due   Zoster Vaccines- Shingrix (1 of 2) 10[birth year]02/1965   Pneumonia Vaccine 39+ Years old (2 of 2 - PCV) 09/03/2014   COVID-19 Vaccine (5 - 2024-25 season) 07/14/2023   DTaP/Tdap/Td (2 - Td or Tdap) 09/04/2023   Diabetic kidney evaluation - Urine ACR  09/08/2023   HEMOGLOBIN A1C  09/15/2023   INFLUENZA VACCINE  02/10/2024 (Originally 06/13/2023)   FOOT EXAM  03/14/2024   MAMMOGRAM  04/23/2024   Diabetic kidney evaluation - eGFR measurement  06/09/2024   OPHTHALMOLOGY EXAM  06/09/2024   Medicare Annual Wellness (AWV)  10/29/2024   DEXA SCAN  Completed   Hepatitis C Screening  Completed   HPV VACCINES  Aged Out   Fecal DNA (Cologuard)  Discontinued    Health Maintenance  Health Maintenance Due  Topic Date Due   Zoster Vaccines- Shingrix (1 of 2) 10[birth year]02/1965   Pneumonia Vaccine 41+ Years old (2 of 2 - PCV) 09/03/2014  COVID-19 Vaccine (5 - 2024-25 season) 07/14/2023   DTaP/Tdap/Td (2 - Td or Tdap) 09/04/2023   Diabetic kidney evaluation - Urine ACR  09/08/2023   HEMOGLOBIN A1C  09/15/2023    Colorectal cancer screening: No longer required.   Mammogram status: Completed 04/24/23. Repeat every year  Bone Density status: Completed 05/26/20. Results reflect: Bone density results: OSTEOPOROSIS. Repeat every 2 years.  Lung Cancer Screening: (Low Dose CT Chest recommended if Age 13-80 years, 20 pack-year currently smoking OR have quit w/in 15years.) does not qualify.    Additional Screening:  Hepatitis C Screening: does qualify; Completed 09/09/17  Vision Screening: Recommended annual ophthalmology exams for early detection of glaucoma  and other disorders of the eye. Is the patient up to date with their annual eye exam?  Yes  Who is the provider or what is the name of the office in which the patient attends annual eye exams? Dr.Woodard If pt is not established with a provider, would they like to be referred to a provider to establish care? No .   Dental Screening: Recommended annual dental exams for proper oral hygiene  Diabetic Foot Exam: Diabetic Foot Exam: Completed 03/15/23  Community Resource Referral / Chronic Care Management: CRR required this visit?  No   CCM required this visit?  No     Plan:     I have personally reviewed and noted the following in the patient's chart:   Medical and social history Use of alcohol, tobacco or illicit drugs  Current medications and supplements including opioid prescriptions. Patient is not currently taking opioid prescriptions. Functional ability and status Nutritional status Physical activity Advanced directives List of other physicians Hospitalizations, surgeries, and ER visits in previous 12 months Vitals Screenings to include cognitive, depression, and falls Referrals and appointments  In addition, I have reviewed and discussed with patient certain preventive protocols, quality metrics, and best practice recommendations. A written personalized care plan for preventive services as well as general preventive health recommendations were provided to patient.     Hal Hope, LPN   16/08/9603   After Visit Summary: (MyChart) Due to this being a telephonic visit, the after visit summary with patients personalized plan was offered to patient via MyChart   Nurse Notes: none

## 2023-11-19 DIAGNOSIS — L218 Other seborrheic dermatitis: Secondary | ICD-10-CM | POA: Diagnosis not present

## 2023-11-19 DIAGNOSIS — L400 Psoriasis vulgaris: Secondary | ICD-10-CM | POA: Diagnosis not present

## 2023-12-12 ENCOUNTER — Other Ambulatory Visit: Payer: Self-pay | Admitting: Family Medicine

## 2023-12-12 ENCOUNTER — Ambulatory Visit: Payer: Medicare PPO | Admitting: Family Medicine

## 2023-12-12 ENCOUNTER — Encounter: Payer: Self-pay | Admitting: Family Medicine

## 2023-12-12 VITALS — BP 112/74 | HR 75 | Ht 68.0 in | Wt 160.0 lb

## 2023-12-12 DIAGNOSIS — E7800 Pure hypercholesterolemia, unspecified: Secondary | ICD-10-CM

## 2023-12-12 DIAGNOSIS — E119 Type 2 diabetes mellitus without complications: Secondary | ICD-10-CM

## 2023-12-12 DIAGNOSIS — Z7984 Long term (current) use of oral hypoglycemic drugs: Secondary | ICD-10-CM

## 2023-12-12 DIAGNOSIS — K219 Gastro-esophageal reflux disease without esophagitis: Secondary | ICD-10-CM

## 2023-12-12 DIAGNOSIS — I10 Essential (primary) hypertension: Secondary | ICD-10-CM

## 2023-12-12 DIAGNOSIS — F419 Anxiety disorder, unspecified: Secondary | ICD-10-CM | POA: Diagnosis not present

## 2023-12-12 MED ORDER — OMEPRAZOLE 20 MG PO CPDR: DELAYED_RELEASE_CAPSULE | ORAL | 1 refills | Status: AC

## 2023-12-12 MED ORDER — SIMVASTATIN 20 MG PO TABS: ORAL_TABLET | ORAL | 1 refills | Status: AC

## 2023-12-12 MED ORDER — PAROXETINE HCL 10 MG PO TABS: 10.0000 mg | ORAL_TABLET | Freq: Every day | ORAL | 1 refills | Status: AC

## 2023-12-12 MED ORDER — RAMIPRIL 10 MG PO CAPS: 10.0000 mg | ORAL_CAPSULE | Freq: Every day | ORAL | 1 refills | Status: AC

## 2023-12-12 MED ORDER — METOPROLOL TARTRATE 100 MG PO TABS: 100.0000 mg | ORAL_TABLET | Freq: Two times a day (BID) | ORAL | 1 refills | Status: AC

## 2023-12-12 NOTE — Progress Notes (Signed)
Date:  12/12/2023   Name:  Bianca Hawkins   DOB:  [birth year]   MRN:  [id number]   Chief Complaint: Hyperlipidemia, Hypertension, Depression, Gastroesophageal Reflux, and Diabetes  Hyperlipidemia This is a chronic problem. The current episode started more than 1 year ago. The problem is controlled. Recent lipid tests were reviewed and are normal. She has no history of chronic renal disease, diabetes, hypothyroidism, liver disease, obesity or nephrotic syndrome. There are no known factors aggravating her hyperlipidemia. Pertinent negatives include no chest pain, focal sensory loss, focal weakness, myalgias or shortness of breath. Current antihyperlipidemic treatment includes statins. The current treatment provides moderate improvement of lipids. There are no compliance problems.  There are no known risk factors for coronary artery disease.  Hypertension This is a recurrent problem. The problem has been gradually improving since onset. Pertinent negatives include no blurred vision, chest pain, headaches, neck pain, orthopnea, palpitations, peripheral edema, PND, shortness of breath or sweats. Risk factors for coronary artery disease include dyslipidemia. Past treatments include beta blockers. The current treatment provides moderate improvement. There are no compliance problems.  There is no history of angina, kidney disease, CAD/MI or CVA. There is no history of chronic renal disease, a hypertension causing med, renovascular disease or sleep apnea.  Depression        This is a chronic problem.  The current episode started more than 1 year ago.   The onset quality is undetermined.   The problem occurs intermittently.  The problem has been gradually improving since onset.  Associated symptoms include no decreased concentration, no fatigue, no helplessness, no hopelessness, does not have insomnia, not irritable, no restlessness, no decreased interest, no appetite change, no body aches, no myalgias,  no headaches, no indigestion, not sad and no suicidal ideas.     The symptoms are aggravated by nothing.  Past treatments include SSRIs - Selective serotonin reuptake inhibitors.  Compliance with treatment is variable.  Past compliance problems include difficulty understanding directions.  Previous treatment provided mild relief.   Pertinent negatives include no hypothyroidism. Gastroesophageal Reflux She reports no abdominal pain, no belching, no chest pain, no choking, no coughing, no dysphagia, no globus sensation, no heartburn, no hoarse voice, no nausea, no sore throat, no stridor, no tooth decay, no water brash or no wheezing. This is a chronic problem. The problem has been resolved. The symptoms are aggravated by certain foods. Pertinent negatives include no fatigue or weight loss. She has tried a PPI for the symptoms. The treatment provided moderate relief.  Diabetes She presents for her follow-up diabetic visit. Her disease course has been stable. Pertinent negatives for hypoglycemia include no confusion, dizziness, headaches, hunger, mood changes, nervousness/anxiousness, pallor, seizures, sleepiness, speech difficulty, sweats or tremors. Pertinent negatives for diabetes include no blurred vision, no chest pain, no fatigue, no foot paresthesias, no foot ulcerations, no polydipsia, no polyphagia, no polyuria, no visual change, no weakness and no weight loss. There are no hypoglycemic complications. Symptoms are worsening. There are no diabetic complications. Pertinent negatives for diabetic complications include no CVA. She is following a generally healthy diet. Meal planning includes avoidance of concentrated sweets and carbohydrate counting. She participates in exercise intermittently. There is no change in her home blood glucose trend. Her breakfast blood glucose is taken between 8-9 am. Her breakfast blood glucose range is generally 90-110 mg/dl.    Lab Results  Component Value Date   NA 140  06/10/2023   K 5.3 (H) 06/10/2023   CO2  27 06/10/2023   GLUCOSE 142 (H) 06/10/2023   BUN 18 06/10/2023   CREATININE 0.73 06/10/2023   CALCIUM 10.8 (H) 06/10/2023   EGFR 85 06/10/2023   GFRNONAA >60 10/15/2016   Lab Results  Component Value Date   CHOL 156 12/10/2022   HDL 63 12/10/2022   LDLCALC 74 12/10/2022   TRIG 105 12/10/2022   CHOLHDL 2.5 09/25/2016   Lab Results  Component Value Date   TSH 0.80 08/04/2021   Lab Results  Component Value Date   HGBA1C 6.5 03/15/2023   Lab Results  Component Value Date   WBC 7.8 09/01/2019   HGB 12.2 09/01/2019   HCT 37.0 09/01/2019   MCV 87.3 09/01/2019   PLT 135 (L) 09/01/2019   Lab Results  Component Value Date   ALT 17 06/10/2023   AST 23 06/10/2023   ALKPHOS 44 06/10/2023   BILITOT 0.6 06/10/2023   No results found for: "25OHVITD2", "25OHVITD3", "VD25OH"   Review of Systems  Constitutional:  Negative for appetite change, fatigue and weight loss.  HENT:  Negative for hoarse voice and sore throat.   Eyes:  Negative for blurred vision.  Respiratory:  Negative for cough, choking, shortness of breath and wheezing.   Cardiovascular:  Negative for chest pain, palpitations, orthopnea and PND.  Gastrointestinal:  Negative for abdominal pain, dysphagia, heartburn and nausea.  Endocrine: Negative for polydipsia, polyphagia and polyuria.  Musculoskeletal:  Negative for myalgias and neck pain.  Skin:  Negative for pallor.  Neurological:  Negative for dizziness, tremors, focal weakness, seizures, speech difficulty, weakness and headaches.  Psychiatric/Behavioral:  Positive for depression. Negative for confusion, decreased concentration and suicidal ideas. The patient is not nervous/anxious and does not have insomnia.     Patient Active Problem List   Diagnosis Date Noted   Varicose veins of leg with pain 01/08/2022   Chronic venous insufficiency 01/08/2022   Nocturnal leg cramps 01/08/2022   Pure hypercholesterolemia  09/24/2018   History of biliary T-tube placement 01/22/2018   Recurrent major depressive episodes (HCC) 01/22/2018   Age-related osteoporosis without current pathological fracture 09/09/2017   Hyperlipidemia due to type 2 diabetes mellitus (HCC) 09/09/2017   Panic attacks 04/16/2017   Hyperparathyroidism, primary (HCC) 03/01/2017   Multinodular goiter 03/01/2017   Essential hypertension 06/28/2016   Chronic anxiety 06/28/2016   Type 2 diabetes mellitus without complication, without long-term current use of insulin (HCC) 08/09/2015   Familial multiple lipoprotein-type hyperlipidemia 08/09/2015   Pain in joint 08/09/2015   Encounter for general adult medical examination without abnormal findings 08/09/2015   Chronic obstructive pulmonary disease (COPD) (HCC) 08/09/2015   H/O: depression 08/09/2015   Gastroesophageal reflux disease without esophagitis 08/09/2015   Blood glucose elevated 08/09/2015   Encounter for screening for osteoporosis 08/09/2015   Need for vaccination 08/09/2015   Chronic MEE (middle ear effusion) 08/09/2015   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    Allergies  Allergen Reactions   Capsicum Shortness Of Breath   Guggulipid-Black Pepper Anaphylaxis and Diarrhea   Other Shortness Of Breath    Pepper   Egg-Derived Products    Sulfa Antibiotics     Past Surgical History:  Procedure Laterality Date   BIOPSY BREAST     benign   BREAST BIOPSY Right 2014   core with clips;benign per pt   BREAST CYST ASPIRATION     COLONOSCOPY  2008   cleared for 10 years/ occults- 07/22/2015- negative   INNER EAR SURGERY     benign tumor behind  eardrum   PARATHYROID EXPLORATION     nodule removed   THYROID EXPLORATION  04/2017   nodule removed    Social History   Tobacco Use   Smoking status: Former    Current packs/day: 0.00    Average packs/day: 0.5 packs/day for 30.0 years (15.0 ttl pk-yrs)    Types: Cigarettes    Start date: 74    Quit date: 2003     Years since quitting: 22.0   Smokeless tobacco: Never  Vaping Use   Vaping status: Never Used  Substance Use Topics   Alcohol use: No    Alcohol/week: 0.0 standard drinks of alcohol   Drug use: No     Medication list has been reviewed and updated.  Current Meds  Medication Sig   ACCU-CHEK AVIVA PLUS test strip USE AS DIRECTED   aspirin 81 MG tablet Take 1 tablet by mouth daily.   Blood Glucose Monitoring Suppl (ACCU-CHEK GUIDE ME) w/Device KIT SMARTSIG:Via Meter   cetirizine (ZYRTEC) 5 MG tablet Take 1 tablet by mouth as needed.   Cholecalciferol 25 MCG (1000 UT) tablet Take 1,000 mg by mouth daily.   fluticasone (FLONASE) 50 MCG/ACT nasal spray Place 1 spray into both nostrils as needed.   glipiZIDE (GLUCOTROL XL) 5 MG 24 hr tablet Take 5 mg by mouth daily with breakfast. Dr Gershon Crane   ketoconazole (NIZORAL) 2 % shampoo Apply 1 Application topically 2 (two) times a week.   ketoconazole (NIZORAL) 2 % shampoo Apply 1 Application topically 2 (two) times a week.   linagliptin (TRADJENTA) 5 MG TABS tablet Take 5 mg by mouth daily. Dr Gershon Crane   metFORMIN (GLUCOPHAGE) 1000 MG tablet Take 1 tablet (1,000 mg total) by mouth 2 (two) times daily.   metoprolol tartrate (LOPRESSOR) 100 MG tablet Take 1 tablet (100 mg total) by mouth 2 (two) times daily.   nystatin (MYCOSTATIN) 100000 UNIT/ML suspension Take 5 mLs (500,000 Units total) by mouth 4 (four) times daily.   omeprazole (PRILOSEC) 20 MG capsule TAKE 1 CAPSULE BY MOUTH EVERY DAY   PARoxetine (PAXIL) 10 MG tablet Take 1 tablet (10 mg total) by mouth daily.   PROLIA 60 MG/ML SOSY injection Inject into the skin.   ramipril (ALTACE) 10 MG capsule TAKE 1 CAPSULE BY MOUTH EVERY DAY   simvastatin (ZOCOR) 20 MG tablet TAKE ONE TABLET (20 MG) BY MOUTH EVERY DAY       09/12/2023    3:52 PM 06/10/2023   10:54 AM 12/10/2022   10:13 AM 11/06/2022    1:56 PM  GAD 7 : Generalized Anxiety Score  Nervous, Anxious, on Edge 1 0 0 0  Control/stop  worrying 0 0 0 0  Worry too much - different things 0 0 0 0  Trouble relaxing 0 0 0 0  Restless 0 0 0 0  Easily annoyed or irritable 0 0 0 0  Afraid - awful might happen 0 0 0 0  Total GAD 7 Score 1 0 0 0  Anxiety Difficulty Not difficult at all Not difficult at all Not difficult at all Not difficult at all       10/30/2023    1:37 PM 09/12/2023    3:52 PM 06/10/2023   10:54 AM  Depression screen PHQ 2/9  Decreased Interest 0 0 0  Down, Depressed, Hopeless 0 0 0  PHQ - 2 Score 0 0 0  Altered sleeping 0 0 0  Tired, decreased energy 0 0 0  Change in appetite 0 0  0  Feeling bad or failure about yourself  0 0 0  Trouble concentrating 0 0 0  Moving slowly or fidgety/restless 0 0 0  Suicidal thoughts 0 0 0  PHQ-9 Score 0 0 0  Difficult doing work/chores Not difficult at all Not difficult at all Not difficult at all    BP Readings from Last 3 Encounters:  12/12/23 112/74  09/12/23 (!) 150/90  06/10/23 118/72    Physical Exam Vitals and nursing note reviewed.  Constitutional:      General: She is not irritable.She is not in acute distress.    Appearance: She is not diaphoretic.  HENT:     Head: Normocephalic and atraumatic.     Right Ear: Tympanic membrane and external ear normal. There is no impacted cerumen.     Left Ear: Tympanic membrane and external ear normal. There is no impacted cerumen.     Nose: Nose normal.     Mouth/Throat:     Mouth: Mucous membranes are moist.  Eyes:     General:        Right eye: No discharge.        Left eye: No discharge.     Conjunctiva/sclera: Conjunctivae normal.     Pupils: Pupils are equal, round, and reactive to light.  Neck:     Thyroid: No thyromegaly.     Vascular: No JVD.  Cardiovascular:     Rate and Rhythm: Normal rate and regular rhythm.     Heart sounds: Normal heart sounds. No murmur heard.    No friction rub. No gallop.  Pulmonary:     Effort: Pulmonary effort is normal.     Breath sounds: Normal breath sounds.   Abdominal:     General: Bowel sounds are normal.     Palpations: Abdomen is soft. There is no mass.     Tenderness: There is no abdominal tenderness. There is no guarding.  Musculoskeletal:        General: Normal range of motion.     Cervical back: Normal range of motion and neck supple.  Lymphadenopathy:     Cervical: No cervical adenopathy.  Skin:    General: Skin is warm and dry.  Neurological:     Mental Status: She is alert.     Deep Tendon Reflexes: Reflexes are normal and symmetric.     Wt Readings from Last 3 Encounters:  12/12/23 160 lb (72.6 kg)  09/12/23 165 lb (74.8 kg)  06/10/23 162 lb (73.5 kg)    BP 112/74   Pulse 75   Ht 5\' 8"  (1.727 m)   Wt 160 lb (72.6 kg)   BMI 24.33 kg/m   Assessment and Plan:  1. Essential hypertension (Primary) Chronic.  Controlled.  Stable.  Asymptomatic.  Blood pressure today is 112/74.  Tolerating medications well.  Will continue metoprolol 100 mg once a day as well as ramipril 10 mg once a day.  Chronic.  Controlled.  Stable.  Asymptomatic.  Patient without polyuria polydipsia.  He is followed by Dr. Gershon Crane.  We will just check her microalbuminuria as the this is coming up on health maintenance for [place] to evaluate..  Will recheck patient in 6 months - metoprolol tartrate (LOPRESSOR) 100 MG tablet; Take 1 tablet (100 mg total) by mouth 2 (two) times daily.  Dispense: 180 tablet; Refill: 1  2. Diabetes mellitus with no complication (HCC) Chronic.  Controlled.  Stable.  Asymptomatic.  Stable.  Followed by Dr. Gershon Crane and he will continue  her medications but we will check her microalbuminuria as this is coming up with the health maintenance. - Microalbumin / creatinine urine ratio  3. Gastroesophageal reflux disease without esophagitis Chronic.  Controlled.  Stable.  Currently stable no episodes of reflux nausea or dysphagia.  Patient will continue with current level of control which is omeprazole 20 mg once a day. - omeprazole  (PRILOSEC) 20 MG capsule; TAKE 1 CAPSULE BY MOUTH EVERY DAY  Dispense: 90 capsule; Refill: 1  4. Chronic anxiety Chronic.  Controlled.  Stable.  PHQ is 0 GAD score is 1 will continue Paxil 10 mg once a day. - PARoxetine (PAXIL) 10 MG tablet; Take 1 tablet (10 mg total) by mouth daily.  Dispense: 90 tablet; Refill: 1  5. Pure hypercholesterolemia Chronic.  Controlled.  Stable.  Continue simvastatin 20 mg once a day.  Patient has been reinforced with her low-cholesterol low triglyceride dietary guidelines as well as she will continue her simvastatin 20 mg once a day. - simvastatin (ZOCOR) 20 MG tablet; TAKE ONE TABLET (20 MG) BY MOUTH EVERY DAY  Dispense: 90 tablet; Refill: 1    Elizabeth Sauer, MD

## 2023-12-12 NOTE — Patient Instructions (Signed)
..  mmc

## 2023-12-13 LAB — MICROALBUMIN / CREATININE URINE RATIO
Creatinine, Urine: 191.4 mg/dL
Microalb/Creat Ratio: 13 mg/g{creat} (ref 0–29)
Microalbumin, Urine: 24.9 ug/mL

## 2023-12-14 ENCOUNTER — Encounter: Payer: Self-pay | Admitting: Family Medicine

## 2024-03-27 ENCOUNTER — Encounter: Payer: Self-pay | Admitting: Family Medicine

## 2024-07-07 ENCOUNTER — Other Ambulatory Visit: Payer: Self-pay | Admitting: Internal Medicine

## 2024-07-07 DIAGNOSIS — Z1231 Encounter for screening mammogram for malignant neoplasm of breast: Secondary | ICD-10-CM

## 2024-07-29 ENCOUNTER — Ambulatory Visit
Admission: RE | Admit: 2024-07-29 | Discharge: 2024-07-29 | Disposition: A | Discharge status: Home / Self Care | Source: Ambulatory Visit | Attending: Internal Medicine | Admitting: Internal Medicine

## 2024-07-29 DIAGNOSIS — Z1231 Encounter for screening mammogram for malignant neoplasm of breast: Secondary | ICD-10-CM | POA: Diagnosis present

## 2024-08-04 LAB — COLOGUARD: COLOGUARD: POSITIVE — AB

## 2024-08-27 ENCOUNTER — Encounter
Admission: RE | Disposition: A | Discharge status: Home / Self Care | Payer: Self-pay | Source: Home / Self Care | Attending: Gastroenterology

## 2024-08-27 ENCOUNTER — Ambulatory Visit
Admission: RE | Admit: 2024-08-27 | Discharge: 2024-08-27 | Disposition: A | Discharge status: Home / Self Care | Attending: Gastroenterology | Admitting: Gastroenterology

## 2024-08-27 ENCOUNTER — Ambulatory Visit: Admitting: Anesthesiology

## 2024-08-27 ENCOUNTER — Encounter: Payer: Self-pay | Admitting: Gastroenterology

## 2024-08-27 DIAGNOSIS — R195 Other fecal abnormalities: Secondary | ICD-10-CM | POA: Insufficient documentation

## 2024-08-27 DIAGNOSIS — J449 Chronic obstructive pulmonary disease, unspecified: Secondary | ICD-10-CM | POA: Insufficient documentation

## 2024-08-27 DIAGNOSIS — K219 Gastro-esophageal reflux disease without esophagitis: Secondary | ICD-10-CM | POA: Diagnosis not present

## 2024-08-27 DIAGNOSIS — K640 First degree hemorrhoids: Secondary | ICD-10-CM | POA: Insufficient documentation

## 2024-08-27 DIAGNOSIS — E119 Type 2 diabetes mellitus without complications: Secondary | ICD-10-CM | POA: Diagnosis not present

## 2024-08-27 DIAGNOSIS — F419 Anxiety disorder, unspecified: Secondary | ICD-10-CM | POA: Insufficient documentation

## 2024-08-27 DIAGNOSIS — Z1211 Encounter for screening for malignant neoplasm of colon: Secondary | ICD-10-CM | POA: Diagnosis not present

## 2024-08-27 DIAGNOSIS — Z7984 Long term (current) use of oral hypoglycemic drugs: Secondary | ICD-10-CM | POA: Diagnosis not present

## 2024-08-27 DIAGNOSIS — D127 Benign neoplasm of rectosigmoid junction: Secondary | ICD-10-CM | POA: Insufficient documentation

## 2024-08-27 DIAGNOSIS — I10 Essential (primary) hypertension: Secondary | ICD-10-CM | POA: Diagnosis not present

## 2024-08-27 DIAGNOSIS — Z79899 Other long term (current) drug therapy: Secondary | ICD-10-CM | POA: Insufficient documentation

## 2024-08-27 HISTORY — PX: POLYPECTOMY: SHX149

## 2024-08-27 HISTORY — PX: COLONOSCOPY: SHX5424

## 2024-08-27 LAB — GLUCOSE, CAPILLARY: Glucose-Capillary: 194 mg/dL — ABNORMAL HIGH (ref 70–99)

## 2024-08-27 SURGERY — COLONOSCOPY
Anesthesia: General

## 2024-08-27 MED ORDER — SODIUM CHLORIDE 0.9 % IV SOLN: INTRAVENOUS | Status: DC

## 2024-08-27 MED ORDER — PROPOFOL 10 MG/ML IV BOLUS
INTRAVENOUS | Status: DC | PRN
  Administered 2024-08-27: 20 mg via INTRAVENOUS
  Administered 2024-08-27: 40 mg via INTRAVENOUS

## 2024-08-27 MED ORDER — LIDOCAINE HCL (CARDIAC) PF 100 MG/5ML IV SOSY
PREFILLED_SYRINGE | INTRAVENOUS | Status: DC | PRN
  Administered 2024-08-27: 60 mg via INTRAVENOUS

## 2024-08-27 MED ORDER — LIDOCAINE HCL (PF) 2 % IJ SOLN
INTRAMUSCULAR | Status: AC
  Filled 2024-08-27: qty 5

## 2024-08-27 MED ORDER — PROPOFOL 500 MG/50ML IV EMUL
INTRAVENOUS | Status: DC | PRN
Start: 2024-08-27 — End: 2024-08-27
  Administered 2024-08-27: 75 ug/kg/min via INTRAVENOUS

## 2024-08-27 NOTE — H&P (Signed)
 Pre-Procedure H&P   Patient ID: Bianca Hawkins is a 78 y.o. female.  Gastroenterology Provider: [name], DO  Referring Provider: Jonette Primmer, PA PCP: Joshua Cathryne BROCKS, MD (Inactive)  Date: 08/27/2024  HPI Ms. Bianca Hawkins is a 78 y.o. female who presents today for Colonoscopy for Positive Cologuard .  She has been losing weight this year and is down to 144. Down approximately 10-12 lbs per notation.  BM otherwise regular no m/h/c/d.  Csy 2007 normal  No fhx crc or polyps   Past Medical History:  Diagnosis Date   Allergy    Anxiety    Depression    reccurent depression to due situational disturbance   Diabetes mellitus without complication (HCC)    GERD (gastroesophageal reflux disease)    Hyperlipidemia    Hypertension    Osteoporosis     Past Surgical History:  Procedure Laterality Date   BIOPSY BREAST     benign   BREAST BIOPSY Right 2014   core with clips;benign per pt   BREAST CYST ASPIRATION     COLONOSCOPY  2008   cleared for 10 years/ occults- 07/22/2015- negative   INNER EAR SURGERY     benign tumor behind eardrum   PARATHYROID EXPLORATION     nodule removed   THYROID  EXPLORATION  04/2017   nodule removed    Family History No h/o GI disease or malignancy  Review of Systems  Constitutional:  Negative for activity change, appetite change, chills, diaphoresis, fatigue, fever and unexpected weight change.  HENT:  Negative for trouble swallowing and voice change.   Respiratory:  Negative for shortness of breath and wheezing.   Cardiovascular:  Negative for chest pain, palpitations and leg swelling.  Gastrointestinal:  Negative for abdominal distention, abdominal pain, anal bleeding, blood in stool, constipation, diarrhea, nausea, rectal pain and vomiting.  Musculoskeletal:  Negative for arthralgias and myalgias.  Skin:  Negative for color change and pallor.  Neurological:  Negative for dizziness, syncope and weakness.   Psychiatric/Behavioral:  Negative for confusion.   All other systems reviewed and are negative.    Medications No current facility-administered medications on file prior to encounter.   Current Outpatient Medications on File Prior to Encounter  Medication Sig Dispense Refill   aspirin 81 MG tablet Take 1 tablet by mouth daily.     glipiZIDE  (GLUCOTROL  XL) 5 MG 24 hr tablet Take 5 mg by mouth daily with breakfast. Dr Cherilyn     linagliptin (TRADJENTA) 5 MG TABS tablet Take 5 mg by mouth daily. Dr Cherilyn     metFORMIN  (GLUCOPHAGE ) 1000 MG tablet Take 1 tablet (1,000 mg total) by mouth 2 (two) times daily. 180 tablet 1   metoprolol  tartrate (LOPRESSOR ) 100 MG tablet Take 1 tablet (100 mg total) by mouth 2 (two) times daily. 180 tablet 1   omeprazole  (PRILOSEC) 20 MG capsule TAKE 1 CAPSULE BY MOUTH EVERY DAY 90 capsule 1   PARoxetine  (PAXIL ) 10 MG tablet Take 1 tablet (10 mg total) by mouth daily. 90 tablet 1   ramipril  (ALTACE ) 10 MG capsule Take 1 capsule (10 mg total) by mouth daily. 90 capsule 1   simvastatin  (ZOCOR ) 20 MG tablet TAKE ONE TABLET (20 MG) BY MOUTH EVERY DAY 90 tablet 1   ACCU-CHEK AVIVA PLUS test strip USE AS DIRECTED 100 each 0   Blood Glucose Monitoring Suppl (ACCU-CHEK GUIDE ME) w/Device KIT SMARTSIG:Via Meter     cetirizine (ZYRTEC) 5 MG tablet Take 1 tablet  by mouth as needed.     Cholecalciferol 25 MCG (1000 UT) tablet Take 1,000 mg by mouth daily.     fluticasone  (FLONASE ) 50 MCG/ACT nasal spray Place 1 spray into both nostrils as needed. 16 g 0   ketoconazole  (NIZORAL ) 2 % shampoo Apply 1 Application topically 2 (two) times a week. 120 mL 0   ketoconazole  (NIZORAL ) 2 % shampoo Apply 1 Application topically 2 (two) times a week. 120 mL 0   nystatin  (MYCOSTATIN ) 100000 UNIT/ML suspension Take 5 mLs (500,000 Units total) by mouth 4 (four) times daily. 60 mL 0   PROLIA  60 MG/ML SOSY injection Inject into the skin.      Pertinent medications related to GI and  procedure were reviewed by me with the patient prior to the procedure   Current Facility-Administered Medications:    0.9 %  sodium chloride infusion, , Intravenous, Continuous, Onita Elspeth Sharper, DO, Last Rate: 20 mL/hr at 08/27/24 9187, New Bag at 08/27/24 9187  sodium chloride 20 mL/hr at 08/27/24 9187       Allergies  Allergen Reactions   Capsicum Shortness Of Breath   Guggulipid-Black Pepper Anaphylaxis and Diarrhea   Other Shortness Of Breath    Pepper   Egg Protein-Containing Drug Products    Sulfa Antibiotics    Allergies were reviewed by me prior to the procedure  Objective   Body mass index is 22.02 kg/m. Vitals:   08/27/24 0807  BP: (!) 163/94  Pulse: (!) 106  Resp: 18  Temp: (!) 96.5 F (35.8 C)  TempSrc: Temporal  Weight: 65.7 kg  Height: 5' 8 (1.727 m)     Physical Exam Vitals and nursing note reviewed.  Constitutional:      General: She is not in acute distress.    Appearance: Normal appearance. She is not ill-appearing, toxic-appearing or diaphoretic.  HENT:     Head: Normocephalic and atraumatic.     Nose: Nose normal.     Comments: Bluish hue    Mouth/Throat:     Mouth: Mucous membranes are moist.     Pharynx: Oropharynx is clear.  Eyes:     General: No scleral icterus.    Extraocular Movements: Extraocular movements intact.  Cardiovascular:     Rate and Rhythm: Regular rhythm. Tachycardia present.     Heart sounds: Normal heart sounds. No murmur heard.    No friction rub. No gallop.  Pulmonary:     Effort: Pulmonary effort is normal. No respiratory distress.     Breath sounds: Normal breath sounds. No wheezing, rhonchi or rales.  Abdominal:     General: Bowel sounds are normal. There is no distension.     Palpations: Abdomen is soft.     Tenderness: There is no abdominal tenderness. There is no guarding or rebound.  Musculoskeletal:     Cervical back: Neck supple.     Right lower leg: No edema.     Left lower leg: No edema.   Skin:    General: Skin is warm and dry.     Coloration: Skin is not jaundiced or pale.  Neurological:     General: No focal deficit present.     Mental Status: She is alert and oriented to person, place, and time. Mental status is at baseline.  Psychiatric:        Mood and Affect: Mood normal.        Behavior: Behavior normal.        Thought Content: Thought content normal.  Judgment: Judgment normal.      Assessment:  Ms. Bianca Hawkins is a 78 y.o. female  who presents today for Colonoscopy for Positive Cologuard .  Plan:  Colonoscopy with possible intervention today  Colonoscopy with possible biopsy, control of bleeding, polypectomy, and interventions as necessary has been discussed with the patient/patient representative. Informed consent was obtained from the patient/patient representative after explaining the indication, nature, and risks of the procedure including but not limited to death, bleeding, perforation, missed neoplasm/lesions, cardiorespiratory compromise, and reaction to medications. Opportunity for questions was given and appropriate answers were provided. Patient/patient representative has verbalized understanding is amenable to undergoing the procedure.   [name], DO  [facility]  Portions of the record may have been created with voice recognition software. Occasional wrong-word or 'sound-a-like' substitutions may have occurred due to the inherent limitations of voice recognition software.  Read the chart carefully and recognize, using context, where substitutions may have occurred.

## 2024-08-27 NOTE — Anesthesia Preprocedure Evaluation (Signed)
 Anesthesia Evaluation  Patient identified by MRN, date of birth, ID band Patient awake    Reviewed: Allergy & Precautions, NPO status , Patient's Chart, lab work & pertinent test results  Airway Mallampati: II  TM Distance: >3 FB Neck ROM: Full    Dental  (+) Upper Dentures, Lower Dentures   Pulmonary neg pulmonary ROS, COPD, Patient abstained from smoking., former smoker   Pulmonary exam normal breath sounds clear to auscultation       Cardiovascular Exercise Tolerance: Good hypertension, Pt. on medications negative cardio ROS Normal cardiovascular exam Rhythm:Regular Rate:Normal     Neuro/Psych   Anxiety     negative neurological ROS  negative psych ROS   GI/Hepatic negative GI ROS, Neg liver ROS,GERD  Medicated,,  Endo/Other  negative endocrine ROSdiabetes, Gestational, Oral Hypoglycemic Agents    Renal/GU negative Renal ROS  negative genitourinary   Musculoskeletal negative musculoskeletal ROS (+)    Abdominal   Peds negative pediatric ROS (+)  Hematology negative hematology ROS (+)   Anesthesia Other Findings Past Medical History: No date: Allergy No date: Anxiety No date: Depression     Comment:  reccurent depression to due situational disturbance No date: Diabetes mellitus without complication (HCC) No date: GERD (gastroesophageal reflux disease) No date: Hyperlipidemia No date: Hypertension No date: Osteoporosis  Past Surgical History: No date: BIOPSY BREAST     Comment:  benign 2014: BREAST BIOPSY; Right     Comment:  core with clips;benign per pt No date: BREAST CYST ASPIRATION 2008: COLONOSCOPY     Comment:  cleared for 10 years/ occults- 07/22/2015- negative No date: INNER EAR SURGERY     Comment:  benign tumor behind eardrum No date: PARATHYROID EXPLORATION     Comment:  nodule removed 04/2017: THYROID  EXPLORATION     Comment:  nodule removed  BMI    Body Mass Index: 22.02 kg/m       Reproductive/Obstetrics negative OB ROS                              Anesthesia Physical Anesthesia Plan  ASA: 2  Anesthesia Plan: General   Post-op Pain Management:    Induction: Intravenous  PONV Risk Score and Plan: Propofol infusion and TIVA  Airway Management Planned: Natural Airway and Nasal Cannula  Additional Equipment:   Intra-op Plan:   Post-operative Plan:   Informed Consent: I have reviewed the patients History and Physical, chart, labs and discussed the procedure including the risks, benefits and alternatives for the proposed anesthesia with the patient or authorized representative who has indicated his/her understanding and acceptance.     Dental Advisory Given  Plan Discussed with: CRNA  Anesthesia Plan Comments:         Anesthesia Quick Evaluation

## 2024-08-27 NOTE — Anesthesia Postprocedure Evaluation (Signed)
 Anesthesia Post Note  Patient: Bianca Hawkins  Procedure(s) Performed: COLONOSCOPY POLYPECTOMY, INTESTINE  Patient location during evaluation: PACU Anesthesia Type: General Level of consciousness: awake Pain management: satisfactory to patient Vital Signs Assessment: post-procedure vital signs reviewed and stable Respiratory status: spontaneous breathing Cardiovascular status: stable Anesthetic complications: no   No notable events documented.   Last Vitals:  Vitals:   08/27/24 0907 08/27/24 0916  BP: 127/61 133/81  Pulse: 98 99  Resp: 19 (!) 21  Temp:    SpO2: 100% 100%    Last Pain:  Vitals:   08/27/24 0916  TempSrc:   PainSc: 0-No pain                 VAN STAVEREN,Ammiel Guiney

## 2024-08-27 NOTE — Transfer of Care (Signed)
 Immediate Anesthesia Transfer of Care Note  Patient: Bianca Hawkins  Procedure(s) Performed: COLONOSCOPY POLYPECTOMY, INTESTINE  Patient Location: PACU  Anesthesia Type:General  Level of Consciousness: sedated  Airway & Oxygen Therapy: Patient Spontanous Breathing  Post-op Assessment: Report given to RN and Post -op Vital signs reviewed and stable  Post vital signs: Reviewed and stable  Last Vitals:  Vitals Value Taken Time  BP    Temp    Pulse    Resp    SpO2      Last Pain:  Vitals:   08/27/24 0807  TempSrc: Temporal  PainSc: 0-No pain         Complications: No notable events documented.

## 2024-08-27 NOTE — Op Note (Signed)
 [facility] Patient Name: Bianca Hawkins Procedure Date: 08/27/2024 8:19 AM MRN: [id number] Account #: [id number] Date of Birth: 1946-06-06 Admit Type: Outpatient Age: 78 Room: [facility] ENDO ROOM 1 Gender: Female Note Status: Finalized Instrument Name: Colon Scope [id number] Procedure:             Colonoscopy Indications:           Positive Cologuard test Providers:             [name] DO, DO Referring MD:          Cathryne KYM Molt, MD (Referring MD) Medicines:             Monitored Anesthesia Care Complications:         No immediate complications. Estimated blood loss:                         Minimal. Procedure:             Pre-Anesthesia Assessment:                        - Prior to the procedure, a History and Physical was                         performed, and patient medications and allergies were                         reviewed. The patient is competent. The risks and                         benefits of the procedure and the sedation options and                         risks were discussed with the patient. All questions                         were answered and informed consent was obtained.                         Patient identification and proposed procedure were                         verified by the physician, the nurse, the anesthetist                         and the technician in the endoscopy suite. Mental                         Status Examination: alert and oriented. Airway                         Examination: normal oropharyngeal airway and neck                         mobility. Respiratory Examination: clear to                         auscultation. CV Examination: RRR, no murmurs, no S3  or S4. Prophylactic Antibiotics: The patient does not                         require prophylactic antibiotics. Prior                         Anticoagulants: The patient has taken no anticoagulant                          or antiplatelet agents. ASA Grade Assessment: II - A                         patient with mild systemic disease. After reviewing                         the risks and benefits, the patient was deemed in                         satisfactory condition to undergo the procedure. The                         anesthesia plan was to use monitored anesthesia care                         (MAC). Immediately prior to administration of                         medications, the patient was re-assessed for adequacy                         to receive sedatives. The heart rate, respiratory                         rate, oxygen saturations, blood pressure, adequacy of                         pulmonary ventilation, and response to care were                         monitored throughout the procedure. The physical                         status of the patient was re-assessed after the                         procedure.                        After obtaining informed consent, the colonoscope was                         passed under direct vision. Throughout the procedure,                         the patient's blood pressure, pulse, and oxygen                         saturations were monitored continuously. The  Colonoscope was introduced through the anus and                         advanced to the the cecum, identified by appendiceal                         orifice and ileocecal valve. The colonoscopy was                         performed without difficulty. The patient tolerated                         the procedure well. The quality of the bowel                         preparation was evaluated using the BBPS [facility] Bowel                         Preparation Scale) with scores of: Right Colon = 3,                         Transverse Colon = 3 and Left Colon = 3 (entire mucosa                         seen well with no residual staining, small fragments                         of stool  or opaque liquid). The total BBPS score                         equals 9. The ileocecal valve, appendiceal orifice,                         and rectum were photographed. Findings:      The perianal and digital rectal examinations were normal. Pertinent       negatives include normal sphincter tone.      Retroflexion in the right colon was performed.      Three sessile polyps were found in the recto-sigmoid colon. The polyps       were 3 to 5 mm in size. These polyps were removed with a cold snare.       Resection and retrieval were complete. Estimated blood loss was minimal.      Non-bleeding internal hemorrhoids were found during retroflexion. The       hemorrhoids were Grade I (internal hemorrhoids that do not prolapse).       Estimated blood loss: none.      The exam was otherwise without abnormality on direct and retroflexion       views. Impression:            - Three 3 to 5 mm polyps at the recto-sigmoid colon,                         removed with a cold snare. Resected and retrieved.                        - Non-bleeding internal hemorrhoids.                        -  The examination was otherwise normal on direct and                         retroflexion views. Recommendation:        - Patient has a contact number available for                         emergencies. The signs and symptoms of potential                         delayed complications were discussed with the patient.                         Return to normal activities tomorrow. Written                         discharge instructions were provided to the patient.                        - Discharge patient to home.                        - Resume previous diet.                        - Continue present medications.                        - No ibuprofen, naproxen, or other non-steroidal                         anti-inflammatory drugs for 5 days after polyp removal.                        - Await pathology results.                         - Repeat colonoscopy for surveillance based on                         pathology results.                        - Return to referring physician as previously                         scheduled.                        - The findings and recommendations were discussed with                         the patient.                        - The findings and recommendations were discussed with                         the patient's family. Procedure Code(s):     --- Professional ---  54614, Colonoscopy, flexible; with removal of                         tumor(s), polyp(s), or other lesion(s) by snare                         technique Diagnosis Code(s):     --- Professional ---                        D12.7, Benign neoplasm of rectosigmoid junction                        K64.0, First degree hemorrhoids                        R19.5, Other fecal abnormalities CPT copyright 2022 [employer]. All rights reserved. The codes documented in this report are preliminary and upon coder review may  be revised to meet current compliance requirements. Attending Participation:      I personally performed the entire procedure. Elspeth Jungling, DO [name] DO, DO 08/27/2024 9:09:28 AM This report has been signed electronically. Number of Addenda: 0 Note Initiated On: 08/27/2024 8:19 AM Scope Withdrawal Time: 0 hours 15 minutes 3 seconds  Total Procedure Duration: 0 hours 18 minutes 57 seconds  Estimated Blood Loss:  Estimated blood loss was minimal.      [facility]

## 2024-08-27 NOTE — Interval H&P Note (Signed)
 History and Physical Interval Note: Preprocedure H&P from 08/27/24  was reviewed and there was no interval change after seeing and examining the patient.  Written consent was obtained from the patient after discussion of risks, benefits, and alternatives. Patient has consented to proceed with Colonoscopy with possible intervention   08/27/2024 8:25 AM  Bianca Hawkins  has presented today for surgery, with the diagnosis of Positive colorectal cancer screening using Cologuard test [R19.5].  The various methods of treatment have been discussed with the patient and family. After consideration of risks, benefits and other options for treatment, the patient has consented to  Procedure(s) with comments: COLONOSCOPY (N/A) - DM as a surgical intervention.  The patient's history has been reviewed, patient examined, no change in status, stable for surgery.  I have reviewed the patient's chart and labs.  Questions were answered to the patient's satisfaction.     [name]

## 2024-08-28 LAB — SURGICAL PATHOLOGY
